# Patient Record
Sex: Male | Born: 1945 | Race: White | Hispanic: No | Smoking: Former smoker
Health system: Southern US, Community
[De-identification: ages and names within clinical notes are randomized; demographics above are authoritative.]

## PROBLEM LIST (undated history)

## (undated) DIAGNOSIS — C4491 Basal cell carcinoma of skin, unspecified: Secondary | ICD-10-CM

## (undated) DIAGNOSIS — I739 Peripheral vascular disease, unspecified: Secondary | ICD-10-CM

## (undated) DIAGNOSIS — I10 Essential (primary) hypertension: Secondary | ICD-10-CM

## (undated) DIAGNOSIS — R011 Cardiac murmur, unspecified: Secondary | ICD-10-CM

## (undated) DIAGNOSIS — R0602 Shortness of breath: Secondary | ICD-10-CM

## (undated) DIAGNOSIS — I38 Endocarditis, valve unspecified: Secondary | ICD-10-CM

## (undated) DIAGNOSIS — I4821 Permanent atrial fibrillation: Secondary | ICD-10-CM

## (undated) DIAGNOSIS — E785 Hyperlipidemia, unspecified: Secondary | ICD-10-CM

## (undated) DIAGNOSIS — Z9289 Personal history of other medical treatment: Secondary | ICD-10-CM

## (undated) DIAGNOSIS — Z77090 Contact with and (suspected) exposure to asbestos: Secondary | ICD-10-CM

## (undated) DIAGNOSIS — G4730 Sleep apnea, unspecified: Secondary | ICD-10-CM

## (undated) DIAGNOSIS — I2510 Atherosclerotic heart disease of native coronary artery without angina pectoris: Secondary | ICD-10-CM

## (undated) DIAGNOSIS — I509 Heart failure, unspecified: Secondary | ICD-10-CM

## (undated) DIAGNOSIS — J189 Pneumonia, unspecified organism: Secondary | ICD-10-CM

## (undated) HISTORY — DX: Heart failure, unspecified: I50.9

## (undated) HISTORY — PX: CATARACT EXTRACTION W/ INTRAOCULAR LENS  IMPLANT, BILATERAL: SHX1307

## (undated) HISTORY — PX: VASECTOMY: SHX75

## (undated) HISTORY — PX: FASCIOTOMY: SHX132

## (undated) HISTORY — PX: CAROTID ENDARTERECTOMY: SUR193

## (undated) HISTORY — PX: EYE SURGERY: SHX253

---

## 1998-03-29 HISTORY — PX: LUNG SURGERY: SHX703

## 1998-06-28 ENCOUNTER — Inpatient Hospital Stay (HOSPITAL_COMMUNITY): Admission: EM | Admit: 1998-06-28 | Discharge: 1998-06-29 | Payer: Self-pay | Admitting: Emergency Medicine

## 1998-12-05 ENCOUNTER — Ambulatory Visit (HOSPITAL_COMMUNITY): Admission: AD | Admit: 1998-12-05 | Discharge: 1998-12-05 | Payer: Self-pay | Admitting: *Deleted

## 1999-02-27 HISTORY — PX: CARDIAC CATHETERIZATION: SHX172

## 1999-02-27 HISTORY — PX: MITRAL VALVE REPAIR: SHX2039

## 1999-02-27 HISTORY — PX: CORONARY ARTERY BYPASS GRAFT: SHX141

## 1999-03-05 ENCOUNTER — Encounter: Payer: Self-pay | Admitting: Thoracic Surgery (Cardiothoracic Vascular Surgery)

## 1999-03-05 ENCOUNTER — Encounter: Payer: Self-pay | Admitting: Specialist

## 1999-03-05 ENCOUNTER — Encounter (INDEPENDENT_AMBULATORY_CARE_PROVIDER_SITE_OTHER): Payer: Self-pay | Admitting: Specialist

## 1999-03-06 ENCOUNTER — Inpatient Hospital Stay (HOSPITAL_COMMUNITY): Admission: AD | Admit: 1999-03-06 | Discharge: 1999-03-19 | Payer: Self-pay | Admitting: *Deleted

## 1999-03-10 ENCOUNTER — Encounter: Payer: Self-pay | Admitting: Thoracic Surgery (Cardiothoracic Vascular Surgery)

## 1999-03-11 ENCOUNTER — Encounter: Payer: Self-pay | Admitting: Thoracic Surgery (Cardiothoracic Vascular Surgery)

## 1999-03-12 ENCOUNTER — Encounter: Payer: Self-pay | Admitting: Thoracic Surgery (Cardiothoracic Vascular Surgery)

## 1999-03-13 ENCOUNTER — Encounter: Payer: Self-pay | Admitting: Thoracic Surgery (Cardiothoracic Vascular Surgery)

## 1999-03-16 ENCOUNTER — Encounter: Payer: Self-pay | Admitting: Thoracic Surgery (Cardiothoracic Vascular Surgery)

## 1999-03-18 ENCOUNTER — Encounter: Payer: Self-pay | Admitting: Thoracic Surgery (Cardiothoracic Vascular Surgery)

## 1999-05-26 ENCOUNTER — Ambulatory Visit (HOSPITAL_COMMUNITY): Admission: RE | Admit: 1999-05-26 | Discharge: 1999-05-26 | Payer: Self-pay | Admitting: Cardiology

## 1999-12-09 ENCOUNTER — Encounter: Payer: Self-pay | Admitting: Vascular Surgery

## 1999-12-11 ENCOUNTER — Inpatient Hospital Stay: Admission: RE | Admit: 1999-12-11 | Discharge: 1999-12-12 | Payer: Self-pay | Admitting: Vascular Surgery

## 1999-12-11 ENCOUNTER — Encounter (INDEPENDENT_AMBULATORY_CARE_PROVIDER_SITE_OTHER): Payer: Self-pay | Admitting: Specialist

## 1999-12-14 ENCOUNTER — Inpatient Hospital Stay (HOSPITAL_COMMUNITY): Admission: EM | Admit: 1999-12-14 | Discharge: 1999-12-20 | Payer: Self-pay | Admitting: Vascular Surgery

## 2000-01-24 ENCOUNTER — Encounter (INDEPENDENT_AMBULATORY_CARE_PROVIDER_SITE_OTHER): Payer: Self-pay | Admitting: *Deleted

## 2000-01-24 ENCOUNTER — Encounter: Payer: Self-pay | Admitting: Vascular Surgery

## 2000-01-24 ENCOUNTER — Inpatient Hospital Stay (HOSPITAL_COMMUNITY): Admission: RE | Admit: 2000-01-24 | Discharge: 2000-01-28 | Payer: Self-pay | Admitting: Vascular Surgery

## 2004-01-29 ENCOUNTER — Ambulatory Visit: Payer: Self-pay | Admitting: *Deleted

## 2004-02-26 ENCOUNTER — Ambulatory Visit: Payer: Self-pay | Admitting: Cardiology

## 2004-04-02 ENCOUNTER — Ambulatory Visit: Payer: Self-pay | Admitting: Cardiovascular Disease

## 2004-04-30 ENCOUNTER — Ambulatory Visit: Payer: Self-pay | Admitting: Cardiology

## 2004-05-08 ENCOUNTER — Ambulatory Visit: Payer: Self-pay | Admitting: *Deleted

## 2004-05-14 ENCOUNTER — Ambulatory Visit: Payer: Self-pay | Admitting: *Deleted

## 2004-05-28 ENCOUNTER — Ambulatory Visit: Payer: Self-pay | Admitting: Cardiology

## 2004-06-18 ENCOUNTER — Ambulatory Visit: Payer: Self-pay | Admitting: Cardiology

## 2004-07-20 ENCOUNTER — Ambulatory Visit: Payer: Self-pay | Admitting: Cardiology

## 2004-08-17 ENCOUNTER — Ambulatory Visit: Payer: Self-pay | Admitting: Cardiology

## 2004-09-14 ENCOUNTER — Ambulatory Visit: Payer: Self-pay | Admitting: Cardiology

## 2004-10-14 ENCOUNTER — Ambulatory Visit: Payer: Self-pay | Admitting: Cardiovascular Disease

## 2004-11-11 ENCOUNTER — Ambulatory Visit: Payer: Self-pay | Admitting: Cardiology

## 2004-12-09 ENCOUNTER — Ambulatory Visit: Payer: Self-pay | Admitting: Cardiology

## 2005-01-07 ENCOUNTER — Ambulatory Visit: Payer: Self-pay | Admitting: Cardiology

## 2005-01-20 ENCOUNTER — Ambulatory Visit: Payer: Self-pay | Admitting: Cardiology

## 2005-02-10 ENCOUNTER — Ambulatory Visit: Payer: Self-pay | Admitting: Cardiology

## 2005-02-24 ENCOUNTER — Ambulatory Visit: Payer: Self-pay | Admitting: Cardiology

## 2005-03-19 ENCOUNTER — Ambulatory Visit: Payer: Self-pay | Admitting: Internal Medicine

## 2005-04-08 ENCOUNTER — Ambulatory Visit: Payer: Self-pay | Admitting: Cardiology

## 2005-04-15 ENCOUNTER — Ambulatory Visit: Payer: Self-pay

## 2005-04-15 ENCOUNTER — Encounter: Payer: Self-pay | Admitting: Cardiology

## 2005-04-16 ENCOUNTER — Ambulatory Visit: Payer: Self-pay | Admitting: Cardiology

## 2005-05-14 ENCOUNTER — Ambulatory Visit: Payer: Self-pay | Admitting: Cardiovascular Disease

## 2005-05-26 ENCOUNTER — Ambulatory Visit: Payer: Self-pay | Admitting: Internal Medicine

## 2005-06-02 ENCOUNTER — Ambulatory Visit: Payer: Self-pay | Admitting: Internal Medicine

## 2005-06-10 ENCOUNTER — Ambulatory Visit: Payer: Self-pay | Admitting: Gastroenterology

## 2005-06-11 ENCOUNTER — Ambulatory Visit: Payer: Self-pay | Admitting: Internal Medicine

## 2005-06-18 ENCOUNTER — Ambulatory Visit: Payer: Self-pay | Admitting: Internal Medicine

## 2005-06-23 ENCOUNTER — Ambulatory Visit: Payer: Self-pay | Admitting: Internal Medicine

## 2005-07-09 ENCOUNTER — Ambulatory Visit: Payer: Self-pay | Admitting: Cardiology

## 2005-08-06 ENCOUNTER — Ambulatory Visit: Payer: Self-pay | Admitting: Internal Medicine

## 2005-08-27 ENCOUNTER — Ambulatory Visit: Payer: Self-pay | Admitting: Cardiology

## 2005-09-24 ENCOUNTER — Ambulatory Visit: Payer: Self-pay | Admitting: Cardiology

## 2005-10-13 ENCOUNTER — Ambulatory Visit: Payer: Self-pay | Admitting: Internal Medicine

## 2005-10-15 ENCOUNTER — Ambulatory Visit: Payer: Self-pay | Admitting: Cardiology

## 2005-10-29 ENCOUNTER — Ambulatory Visit: Payer: Self-pay | Admitting: Internal Medicine

## 2005-11-15 ENCOUNTER — Ambulatory Visit: Payer: Self-pay | Admitting: Internal Medicine

## 2005-11-19 ENCOUNTER — Ambulatory Visit: Payer: Self-pay | Admitting: Cardiovascular Disease

## 2005-11-26 ENCOUNTER — Ambulatory Visit: Payer: Self-pay | Admitting: Cardiology

## 2005-12-17 ENCOUNTER — Ambulatory Visit: Payer: Self-pay | Admitting: Cardiovascular Disease

## 2006-01-14 ENCOUNTER — Ambulatory Visit: Payer: Self-pay | Admitting: Cardiology

## 2006-02-04 ENCOUNTER — Ambulatory Visit: Payer: Self-pay | Admitting: Cardiology

## 2006-02-25 ENCOUNTER — Ambulatory Visit: Payer: Self-pay | Admitting: Cardiology

## 2006-03-16 ENCOUNTER — Ambulatory Visit: Payer: Self-pay | Admitting: Cardiology

## 2006-04-07 ENCOUNTER — Ambulatory Visit: Payer: Self-pay | Admitting: Cardiology

## 2006-04-13 ENCOUNTER — Ambulatory Visit: Payer: Self-pay | Admitting: Cardiology

## 2006-05-11 ENCOUNTER — Ambulatory Visit: Payer: Self-pay | Admitting: Cardiology

## 2006-06-08 ENCOUNTER — Ambulatory Visit: Payer: Self-pay | Admitting: Cardiology

## 2006-07-06 ENCOUNTER — Ambulatory Visit: Payer: Self-pay | Admitting: Cardiology

## 2006-07-13 ENCOUNTER — Ambulatory Visit: Payer: Self-pay | Admitting: Internal Medicine

## 2006-08-03 ENCOUNTER — Ambulatory Visit: Payer: Self-pay | Admitting: *Deleted

## 2006-08-19 ENCOUNTER — Ambulatory Visit: Payer: Self-pay | Admitting: Cardiovascular Disease

## 2006-09-16 ENCOUNTER — Ambulatory Visit: Payer: Self-pay | Admitting: Cardiology

## 2006-10-04 ENCOUNTER — Ambulatory Visit: Payer: Self-pay | Admitting: Cardiology

## 2006-10-25 ENCOUNTER — Ambulatory Visit: Payer: Self-pay | Admitting: Cardiology

## 2006-11-22 ENCOUNTER — Ambulatory Visit: Payer: Self-pay | Admitting: Cardiology

## 2006-11-25 DIAGNOSIS — I2510 Atherosclerotic heart disease of native coronary artery without angina pectoris: Secondary | ICD-10-CM | POA: Insufficient documentation

## 2006-11-25 DIAGNOSIS — E785 Hyperlipidemia, unspecified: Secondary | ICD-10-CM | POA: Insufficient documentation

## 2006-11-25 DIAGNOSIS — I10 Essential (primary) hypertension: Secondary | ICD-10-CM | POA: Insufficient documentation

## 2006-11-25 DIAGNOSIS — I4891 Unspecified atrial fibrillation: Secondary | ICD-10-CM

## 2006-12-20 ENCOUNTER — Ambulatory Visit: Payer: Self-pay | Admitting: Cardiology

## 2007-01-17 ENCOUNTER — Ambulatory Visit: Payer: Self-pay | Admitting: Cardiology

## 2007-02-14 ENCOUNTER — Ambulatory Visit: Payer: Self-pay | Admitting: Cardiology

## 2007-03-14 ENCOUNTER — Ambulatory Visit: Payer: Self-pay | Admitting: Cardiology

## 2007-03-30 HISTORY — PX: FEMUR FRACTURE SURGERY: SHX633

## 2007-04-05 ENCOUNTER — Ambulatory Visit: Payer: Self-pay | Admitting: Cardiology

## 2007-04-11 ENCOUNTER — Ambulatory Visit: Payer: Self-pay

## 2007-04-11 ENCOUNTER — Encounter: Payer: Self-pay | Admitting: Cardiology

## 2007-04-11 ENCOUNTER — Ambulatory Visit: Payer: Self-pay | Admitting: Cardiology

## 2007-04-11 LAB — CONVERTED CEMR LAB
ALT: 22 units/L (ref 0–53)
AST: 27 units/L (ref 0–37)
Albumin: 4 g/dL (ref 3.5–5.2)
Alkaline Phosphatase: 88 units/L (ref 39–117)
Bilirubin, Direct: 0.2 mg/dL (ref 0.0–0.3)
Cholesterol: 136 mg/dL (ref 0–200)
HDL: 39.4 mg/dL (ref >39.0)
LDL Cholesterol: 89 mg/dL (ref 0–99)
Total Bilirubin: 1.6 mg/dL — ABNORMAL HIGH (ref 0.3–1.2)
Total CHOL/HDL Ratio: 3.5
Total Protein: 7.7 g/dL (ref 6.0–8.3)
Triglycerides: 37 mg/dL (ref 0–149)
VLDL: 7 mg/dL (ref 0–40)

## 2007-05-09 ENCOUNTER — Ambulatory Visit: Payer: Self-pay | Admitting: Internal Medicine

## 2007-06-06 ENCOUNTER — Ambulatory Visit: Payer: Self-pay | Admitting: Cardiovascular Disease

## 2007-07-13 ENCOUNTER — Ambulatory Visit: Payer: Self-pay | Admitting: Cardiology

## 2007-08-10 ENCOUNTER — Ambulatory Visit: Payer: Self-pay | Admitting: Cardiology

## 2007-09-07 ENCOUNTER — Ambulatory Visit: Payer: Self-pay | Admitting: Cardiology

## 2007-10-05 ENCOUNTER — Ambulatory Visit: Payer: Self-pay | Admitting: Cardiology

## 2007-11-02 ENCOUNTER — Ambulatory Visit: Payer: Self-pay | Admitting: Cardiology

## 2007-12-28 ENCOUNTER — Ambulatory Visit: Payer: Self-pay | Admitting: Internal Medicine

## 2008-01-25 ENCOUNTER — Ambulatory Visit: Payer: Self-pay | Admitting: Internal Medicine

## 2008-02-15 ENCOUNTER — Ambulatory Visit: Payer: Self-pay | Admitting: Cardiology

## 2008-03-14 ENCOUNTER — Ambulatory Visit: Payer: Self-pay | Admitting: Cardiology

## 2008-04-11 ENCOUNTER — Ambulatory Visit: Payer: Self-pay | Admitting: Cardiology

## 2008-08-02 ENCOUNTER — Telehealth: Payer: Self-pay | Admitting: Cardiovascular Disease

## 2008-08-27 ENCOUNTER — Encounter: Payer: Self-pay | Admitting: *Deleted

## 2008-10-02 ENCOUNTER — Encounter: Payer: Self-pay | Admitting: *Deleted

## 2010-08-11 NOTE — Assessment & Plan Note (Signed)
[facility]                            CARDIOLOGY OFFICE NOTE   Connor Ford, Connor Ford                     MRN:          [id number]  DATE:04/05/2007                            DOB:          [birth year]    PRIMARY CARE PHYSICIAN:  Dr. Birdie Ford.   REASON FOR VISIT:  Cardiac follow-up.   HISTORY OF PRESENT ILLNESS:  Connor Ford comes in for a 1 year visit.  [place] and has been  doing quite a bit of work.  He has built a deck and also plans to build  a garage and barn.  He is very active and not having any angina or  limiting breathlessness.  He had no significant palpitations or bleeding  problems on Coumadin.  He does state that he was hospitalized briefly  with some type of problem with his left lung, possibly an effusion or  pneumothorax requiring a chest tube, although has not had any  significant sequelae from this.  His electrocardiogram today shows  atrial fibrillation with a stable left bundle branch block pattern.  His  medicines are outlined below.  I note that he did not tolerate our trial  of Coreg and in lieu of Cartia XT.  He states that this made him short  of breath and feels tight in his chest on exertion.  The symptoms  resolved completely when he switched back to [facility].  He is due for  follow-up lipids and an echocardiogram.   ALLERGIES:  NO KNOWN DRUG ALLERGIES.  THE PATIENT DID NOT TOLERATE  COREG.   MEDICATIONS:  1. [facility].  2. Lanoxin 0.125 mg p.o. daily.  3. Enteric-coated aspirin 81 mg p.o. daily.  4. Altace 5 mg b.i.d.  5. Lipitor 20 mg p.o. every night.  6. Cartia XT 180 mg p.o. daily.  7. Sublingual nitroglycerin 0.4 mg p.r.n.   REVIEW OF SYSTEMS:  As described in the history of present illness.   EXAMINATION:  Blood pressure 140/58, heart rate is 68 and irregular,  weight is 214 pounds down from 225 at his last visit.  A tall,  well-  developed male no acute distress.  HEENT:  Conjunctiva, lids normal.  Oropharynx clear.  NECK:  Is supple.  No elevated jugular venous pressure.  No carotid  bruits.  No thyromegaly.  LUNGS:  Are clear without labored breathing.  CARDIAC EXAM:  Reveals an irregularly irregular rhythm with 2/6 systolic  murmur at the base.  No loud diastolic murmur.  No S3 gallop.  Second  heart sounds preserved.  ABDOMEN:  Soft, nontender, normal active bowel sounds, no bruits.  EXTREMITIES:  Showed no pitting edema except around the left ankle (the  patient states he sprained his ankle recently).  Distal pulses 1+.  SKIN:  Warm and dry.  MUSCULOSKELETAL:  No kyphosis is noted.  NEUROPSYCHIATRIC:  The patient is alert and oriented x3.  Affect is  normal.   IMPRESSION AND RECOMMENDATIONS:  1. Permanent atrial fibrillation with left bundle branch block  pattern, asymptomatic.  Plan will be to continue Coumadin and      Cartia XT/digoxin at present doses.  2. Coronary artery disease with previous coronary bypass grafting in      2000 and an ejection fraction of 45% with concurrent mitral valve      repair and resection of a subaortic membrane.  Will plan a follow-      up echocardiogram.  He had evidence of mild aortic and mitral      stenosis by his prior study one year ago.     Jonelle Sidle, MD  Electronically Signed    SGM/MedQ  DD: 04/05/2007  DT: 04/05/2007  Job #: [id number]   cc:   Connor Mole. Swords, MD

## 2010-08-14 NOTE — Cardiovascular Report (Signed)
[place]  Patient:    Connor Ford                      MRN: [id number] Proc. Date: 03/05/99 Adm. Date:  [id number] Attending:  Daisey Must CC:         Valetta Mole. Swords, M.D. [facility]             Daisey Must, M.D. [facility]             Cardiac Catheterization Laboratory                        Cardiac Catheterization  PROCEDURE:  Right and left heart catheterization with coronary angiography, left ventriculography, aortic root angiography, and abdominal aortography.  INDICATIONS:  Connor Ford is a 65 year old male with a history of atrial fibrillation and mild aortic stenosis, mild mitral regurgitation.  He recently as had symptoms of progressive fatigue and dyspnea and new onset angina.  A Cardiolite scan revealed significant ischemia in the anterior wall as well as fixed inferior defect.  DESCRIPTION OF PROCEDURE:  A 6 French sheath was placed in the right femoral artery, 7 French sheath in the right femoral vein.  A standard Swan-Ganz catheter was used for the right heart catheterization.  For the left heart catheterization we utilized a 6 [place] JL5, 6 [place] JR4 and a 5 [place] angled pigtail. Contrast was Omnipaque.  There were no complications.  RESULTS:  HEMODYNAMICS:  Mean right atrial pressure 9, right ventricular pressure 40/70.  Pulmonary artery 40/29.  Pulmonary capillary wedge pressure had a V wave of 22,  mean 20.  Left ventricular pressure 122/20, aortic pressure 119/86.  There is a  very mild gradient across the aortic valve which appears to be less than 5 mmHg. Cardiac output by the thermodilution method is 4.3 with a cardiac index of 1.9.  Cardiac output by the Fick method is 4.4 with a cardiac index of 2.0.  The arterial venous oxygen content difference is 6.8.  LEFT VENTRICULOGRAM:  Left ventriculography reveals akinesis of the apex, hypokinesis of the anterior wall.  Ejection fraction is calculated at  45%. There is 3+ moderate to severe mitral regurgitation.  AORTIC ROOT ANGIOGRAPHY:  Aortic root angiography reveals a mildly dilated aortic root with 2+ mild aortic insufficiency.  Left subclavian artery is patent with a patent internal mammary artery.  ABDOMINAL AORTOGRAM:  Abdominal aortogram revealed a patent abdominal aorta renal arteries and iliac arteries.  CORONARY ARTERIOGRAPHY:  Right dominant system.  Left main:  Left main has an ostial 50% stenosis.  Left anterior descending:  The LAD has a tubular 40% stenosis in the proximal vessel.  In the mid vessel just after a large second diagonal branch is a 95% stenosis.  The distal LAD has a 25% stenosis.  The second diagonal is a large vessel and has a 50% stenosis at its origin.  Left circumflex:  The left circumflex is a relatively small vessel which has a 5% stenosis at its ostium.  There are three small obtuse marginal branches.  Right coronary artery:  The right coronary artery is a large dominant vessel giving rise to a large posterior descending artery, a small posterolateral branch and  large branching second posterolateral branch.  The mid right coronary has a 40%  stenosis followed by an area of ulcerated plaque followed by 60% stenosis.  The  distal vessel has a 30% stenosis at the acute  margin followed by 30% stenosis at the bifurcation of the posterior descending artery.  The posterior descending artery is a large vessel with a 30% stenosis at its origin.  IMPRESSION: 1. Elevated left heart filling pressures with mild pulmonary hypertension. 2. Mildly decreased left ventricular systolic function with 3+ moderate to    severe mitral regurgitation. 3. Very minimal aortic stenosis and mild aortic insufficiency. 4. Three-vessel coronary artery disease.  PLAN:  The patient will be admitted and started on anticoagulation.  We will obtain a transesophageal echocardiogram to further evaluate the anatomy of  the mitral valve.  Cardiovascular Surgery will be consulted for evaluation for coronary artery bypass grafting and mitral valve repair or replacement. DD:  03/05/99 TD:  03/07/99 Job: [id number]

## 2010-08-14 NOTE — H&P (Signed)
[facility] ADMISSION   NAME:Khun, OLUWADAMILARE TOBLER                     MRN:          [id number]  DATE:11/15/2005                            DOB:          22-Apr-1945    PROGRESS NOTE:  Mr. Wigfall walks in today with complaint of back pain. He  has a large infected cyst on his back. These are recurrent issues for him.  He does not have fevers or chills. He has noted some streaking erythema from  the cyst, around to the anterior chest wall. He complains of 5 out of 10  pain. No associated symptoms. No causative factors. No modifying factors.   PAST MEDICAL HISTORY:  As outlined on medication list.   MEDICATIONS:  Include Lanoxin, Cartia, Warfarin (he has held Warfarin  today.)   REVIEW OF SYSTEMS:  He denies nay other complaints.   PHYSICAL EXAMINATION:  VITAL SIGNS:  Temperature 98, pulse 70, respiratory  rate 14.  GENERAL:  He appears as a well developed, well nourished male in no acute  distress.  HEENT:  Normocephalic and atraumatic. Extraocular muscles intact.  NECK:  Supple. Without lymphadenopathy or thyromegaly.  CHEST:  Clear to auscultation.  CARDIAC:  S1 and S2 are normal.  SKIN:  He has a large erythematous, fluctuant cyst on his left flank area.  There is significant erythema streaking from that area, around to the  anterior chest wall.   ASSESSMENT/PLAN:  Subcutaneous cyst, likely infected. I am concerned given  the induration that this may now be an intra-muscular cyst. Also, the  streaking erythema have concern. I will give the patient Rocephin 1 gram IM  in the office and make a surgical appointment for him today.                                   Bruce Rexene Edison Swords, MD   [facility]/MedQ  DD:  11/15/2005  DT:  11/15/2005  Job #:  [id number]

## 2010-08-14 NOTE — Op Note (Signed)
[place]  Patient:    Connor Ford                      MRN: [id number] Proc. Date: 03/10/99 Adm. Date:  [id number] Attending:  Tressie Stalker CC:         Daisey Must, M.D. [facility]             Peter C. Eden Emms, M.D. [facility]             Bruce H. Swords, M.D. [facility]             CVTS office                           Operative Report  PREOPERATIVE DIAGNOSIS:  Severe three-vessel coronary artery disease with class 2 new onset angina, idiopathic hypertrophic subaortic stenosis, mitral regurgitation.  POSTOPERATIVE DIAGNOSIS:  Severe three-vessel coronary artery disease with class 2 new onset angina, idiopathic hypertrophic subaortic stenosis, mitral regurgitation.  OPERATION PERFORMED:  Median sternotomy for coronary artery bypass grafting x 3  (left internal mammary artery to distal left anterior descending coronary artery, saphenous vein graft to first diagonal branch, saphenous vein graft to distal right coronary artery) and mitral valve repair (29 mm St. Jude Taylor posterior annuloplasty) and resection of portion of interventricular septum (septal myomectomy).  SURGEON:  Salvatore Decent. Cornelius Moras, M.D.  ASSISTANT:  Loura Pardon, P.A.  ANESTHESIA:  General.  BRIEF CLINICAL NOTE:  The patient is a 65 year old white male followed by Dr. Birdie Sons and referred by Dr. Loraine Leriche Pulsipher with history of chronic atrial fibrillation dating to April 2000 and symptoms of progressive class 2 congestive heart failure and new onset angina.  The patient has had known history of proximal septal hypertrophy (idiopathic hypertrophic subaortic stenosis) and has developed moderate mitral regurgitation.  Cardiac catheterization demonstrates severe three-vessel coronary artery disease.  A full consultation note was dictated previously and should be seen for further details of the patients presentation nd indications for surgery.  (Dictation [phone])  OPERATIVE  CONSENT:  The patient and the family were counseled at length regarding the indications and potential benefits of elective surgical intervention.  He understands the associated risks of surgery including but not limited to the risks of death, stroke, myocardial infarction, congestive heart failure, bleeding requiring blood transfusion, heart block requiring permanent pacemaker, infection, traumatic ventricular septal defect and recurrent coronary artery disease.  He understands the rationale for the surgical approach as described as well as the  possible need for aortic valve replacement.  He accepts these risks as well as ny unforeseen complications and agrees to proceed with the surgery as described.  DESCRIPTION OF PROCEDURE:  The patient was brought to the operating room on the  above-mentioned date and invasive hemodynamic monitoring was established by the  anesthesia service under the care and direction of Dr. Renaye Rakers.  The patient was placed into supine position on the operating table.  Following induction with general endotracheal anesthesia, the patients chest, abdomen, both groins, both  lower extremities are prepared and draped in a sterile manner.  Transesophageal ecg was performed by Dr. Noreene Larsson.  This confirms the findings as described previously at the time of transesophageal echocardiogram originally performed by Dr. Olga Millers.  There remains evidence of left ventricular hypertrophy with at least  moderate mitral regurgitation, somewhat dilated mitral annulus and left atrium,  moderate to severe proximal septal hypertrophy with subsequent subaortic  stenosis, and mild aortic insufficiency.  Consultation was also obtained intraoperatively  with Dr. Charlton Haws who came and performed intraoperative transesophageal echocardiogram as well.  Discussion was entertained regarding the severity of the patients aortic insufficiency and whether concommitant  aortic valve replacement  was indicated.  It was felt that the degree of insufficiency was mild and that therefore unless other abnormalities were identified at the time of surgery, it  would be best to avoid aortic valve replacement at this time if possible.  A median sternotomy incision was performed and the left internal mammary artery was dissected from the chest wall and prepared for bypass grafting.  The left internal mammary artery was relatively small caliber but has adequate forward flow and was felt to be acceptable conduit for bypass grafting.  Simultaneously, saphenous vein grafting.  Simultaneously saphenous vein was obtained from the patients right lower extremity through a series of longitudinal incisions.  This saphenous vein is of fair to good quality as conduit although it is also slightly smaller than usual diameter.  The patient was heparinized systemically.  The pericardium was opened.  The ascending aorta was inspected and was notably ree of palpable plaques or calcifications.  The ascending aorta was cannulated for cardiopulmonary bypass.  A retrograde cardioplegia catheter was placed through he right atrium into the coronary sinus.  Dual venous cannulation was obtained placing a right angle metal tip cannula directly into the superior vena cava and a second cannula placed low in the right atrium down into the inferior vena cava. Adequate heparinization was verified.  Cardiopulmonary bypass was begun and the surface of the heart was inspected. The left ventricle was moderately hypertrophied and somewhat dilated.  There was some marbling within the wall of the left ventricle consistent with mild cardiomyopathy of likely ischemic origin, particularly along the lateral wall of the left ventricle.  Distal sites are selected for coronary artery bypass grafting.  Of note, there are no branches off of the distal left circumflex coronary artery which are  felt to be acceptable targets for bypass grafting.  There is a large diagonal branch off the proximal left anterior descending coronary artery which actually   supplies the majority of the lateral wall of the left ventricle.  Portions of the saphenous vein and left internal mammary artery were trimmed to the appropriate  length.  A temperature probe was placed in the left ventricular septum and a styrofoam pad was placed to protect the left phrenic nerve from thermal injury. A cardioplegia catheter was placed in the ascending aorta.  The patient was cooled to 28 degrees systemic temperature.  Umbilical tapes were placed around both the superior and inferior vena cava.  The aortic crossclamp as applied and cardioplegia was delivered initially antegrade through the aortic root. Subsequent doses of cardioplegia were administered both through the aortic root, down subsequently placed vein grafts, and retrograde through the coronary sinus  catheter intermittently during portions of the crossclamp part of the operation to keep septal temperature below 12 degrees centegrade.  The following distal coronary anastomoses were performed.  After initially a small incision was made into the  left atrium through the interatrial septum to allow complete decompression of the left side of the heart.  The umbilical tapes were snared down providing total cardiopulmonary bypass.  The following distal coronary anastomoses were performed: (1) The distal right coronary artery was grafted with saphenous vein graft in an end-to-side fashion using running 7-0 Prolene suture.  This coronary measured 2  mm in diameter and has 70 to 80% proximal stenosis.  This coronary is of good quality at the site of distal bypass.  (2) The first diagonal branch of the left anterior descending coronary artery was grafted with the saphenous vein graft in end-to-side fashion using running 7-0 Prolene suture.  This  coronary measured 2 mm in diameter and has a 90% proximal stenosis.  This coronary was of good quality at the site of the distal bypass.  (3) The distal left anterior descending coronary artery was  grafted with the left internal mammary artery using running 8-0 Prolene suture.  This coronary measured 2 mm in diameter and had a 95% proximal stenosis.  This coronary was of fair to good quality at the site of distal bypass.  It was somewhat hard and had palpable plaque throughout its wall throughout the entire course down the anterior wall of the left ventricle.  However, a 1.5 mm probe will pass all the way to the apex of the heart.  Attention is now addressed to the mitral valve.  The atriotomy was extended through the intra-atrial septum and adequate exposure of the mitral valve was facilitated using a self-retaining retractor.  The mitral valve was carefully inspected. The left ventricular cavity was irrigated with iced saline solution and allowed to fill, thereby to carefully demonstrate the anatomical irregularities of the mitral valve.  The anterior leaflet of the mitral valve is somewhat thickened throughout. However, it has only minimal restriction and relatively good mobility.  There is no prolapse of any portion of the anterior leaflet.  The posterior leaflet is mildly redundant particularly towards the posteromedial commissure.  However, there is no prolapse in this region and complicated leaflet repair is felt not to be necessary as with appropriate plication of the posterior annulus, it appears that more normal coaptation of the valve should ensue.  Subsequently, the mitral orifice was sized to a 29 mm Taylor posterior annuloplasty ring.  Posterior annuloplasty was performed using interrupted horizontal mattress 2-0 Ethibond sutures to perform  plication circumferentially around the posterior annulus.  A St. Jude Taylor annuloplasty ring was subsequently  secured in place.  After completion of the repair, the integrity of the mitral valve was again checked by irrigating the left ventricular cavity with iced saline solution.  There appears to be no residual eak and there appears to be normal coaptation of the leaflets.  The self retaining retractor was removed.  An oblique transverse aortotomy incision was performed.  The aortic valve was carefully inspected.  The aortic valve was tricuspid and appeared fairly normal  with only minimal thickening of the edges of the valve leaflet.  All three commissures appeared to coapt normally and the leaflets were delicate and appeared to move normally.  The leaflet apposed to the left main coronary artery was mildly redundant in comparison with the other two remaining leaflets.  However, otherwise this valve appears normal.  Vein retractor was utilized to carefully retract the right coronary leaflet anteriorly, thereby exposing the interventricular septum.  One can easily appreciate very hypertrophied interventricular septum protruding into the LV outflow tract.  In fact, one cannot see past the septum into the left ventricular cavity hardly at all.  The endocardial surface was fibrotic and thickened in this region as is the corresponding fibrotic thickened appearance of the apposed surface of the anterior leaflet of the mitral valve.  A small ribbon retractor was placed through the aortic annulus to retract the anterior leaflet of the  mitral valve inferiorly while the vein retractor was utilized to retract the right coronary usp of the aortic valve anteriorly.  A #15 blade knife fixed to a slightly bent knife blade handle was utilized to perform the septal myomectomy.  The first incision was made longitudinally into the muscle beginning at a location approximately 2 to mm towards the right of the middle of the distance between the commissures between the left and right cusps and the  right and noncoronary cusps of the aortic valve. his incision was completed deep through the hypertrophied muscle going away from the surgeon towards the apex of the heart over a distance of approximately 3 cm.  A  second incision was placed approximately 1 cm to the surgeons left immediately parallel to the first incision which was approximately 3 to 4 mm to the right of the commissure of the left and right cusps of the aortic valve.  Finally, a horizontal incision was used to complete the resection joining the two previously  placed incisions beginning approximately 3 mm below the level of the aortic valve annulus.  By doing so, a trench was created through the hypertrophied septum approximately 1 cm in width and approximately 1 cm deep.  The muscle specimen was removed and briefly inspected and subsequently sent for routine pathologic evaluation.  The remaining trench in the interventricular septum was carefully inspected.  There was no sign of passageway into the right ventricle.  There was no sign of conduction tissue which could be appreciated within the surgical incisions. The left ventricular outflow tract appeared much more open and one could now see freely down into the left ventricular chamber.  The aortotomy was closed using two-layer running everted 4-0 Prolene suture.  Just prior to completion of the closure of the aortotomy, the left atriotomy was closed using two-layer running  everted 3-0 Prolene suture.  One final dose of warm retrograde cardioplegia was  administered.  Subsequently, the left internal mammary artery was opened and the septal temperature was noted to rise rapidly and dramatically.  The patient was  placed in steep Trendelenburg position and lungs were inflated vigorously while  volume was left in the heart.  Additional maneuvers were performed to agitate and removed air from the left atrium, left ventricle, and aortic root.  After completion  of all the deairing maneuvers, the aortic crossclamp was flashed briefly.  Subsequently, the transverse aortotomy closure was completed.  The heart was emptied and the aortic crossclamp was removed after a total crossclamp time of 143 minutes.  The patient was rewarmed to greater than 37 systemic temperature.  Both proximal saphenous vein anastomoses were performed to the ascending aorta under a separate partial occlusion clamp.  The retrograde cardioplegia catheter was removed as were the umbilical tapes previously placed around both vena cavae. ll proximal and distal coronary anastomoses were inspected for hemostasis and appropriate orientation of the grafts.  A ____________ needle was placed in the  ascending aorta to continue the deairing of any remaining bubbles within the left heart circulation.  Epicardial pacing wires were fixed to the right ventricular  outflow tract and to the right atrial appendage.  The patient resumed spontaneous sinus rhythm but heart block persisted requiring dual chamber AV sequential pacing. The patient was weaned from cardiopulmonary bypass without difficulty.  The patients rhythm at separation from bypass was sinus rhythm with complete heart block in slow junctional escape requiring dual chamber AV sequential pacing. The patient was weaned from bypass on low  dose dopamine and milrinone infusions. Total cardiopulmonary bypass time for the operation was 218 minutes.  Follow-up transesophageal echocardiogram performed after separation from bypass was performed initially by Dr. Noreene Larsson and subsequenly by Dr. Eden Emms.  This demonstrates resolution of any significant residual air in the left ventricle, left atrium and LV outflow tract and aorta.  There was no residual mitral regurgitation and the  annuloplasty ring appeared to be seated appropriately.  The subaortic stenosis as greatly reduced and a much broader LV outflow tract could easily  be appreciated. The aortic valve remained unchanged from previously with only mild aortic insufficiency consisting of a small narrow central jet of 1+ aortic insufficiency. Left ventricular function appeared well preserved. No other abnormalities were identified.  Specifically, there was absolutely no sign of ventricular septal defect.  The venous and arterial cannulae were removed uneventfully.  Protamine was administered to reverse the anticoagulation.  The mediastinum and the left chest were irrigated with warm saline containing vancomycin.  Meticulous surgical hemostasis was ascertained.  There remained severe coagulopathy.  An exhaustive  search for any sites of mechanical bleeding was undertaken.  After more than 60  minutes of continued and exhaustive search for sites of mechanical bleeding, one 8-pack of adult platelets and two units fresh frozen plasma were administered for continued coagulopathy.  The mediastinum and the left chest were drained with three chest tubes placed through separate stab incisions inferiorly.  The median sternotomy was closed in routine fashion.  The right lower extremity incisions were closed in multiple layers in routine fashion.  All skin incisions were closed with subcuticular skin closures.  The patient tolerated the procedure well and he was transported to the surgical  intensive care unit in stable condition.  There were no intraoperative complications.  All sponge, needle and instrument counts were verified correct t the completion of the operation. DD:  03/10/99 TD:  03/11/99 Job: [id number]

## 2010-08-14 NOTE — Procedures (Signed)
[place]. [facility]  Patient:    Connor Ford                      MRN: [id number] Proc. Date: 03/05/99 Adm. Date:  [id number] Attending:  Daisey Must                           Procedure Report  PROCEDURE:  Transesophageal echocardiogram.  CARDIOLOGIST:  Madolyn Frieze. Jens Som, M.D.  INDICATIONS:  This is a transesophageal echocardiogram to access the patients mitral regurgitation prior to coronary artery bypass graft.  DESCRIPTION OF PROCEDURE:  The patient was sedated with Demerol 25 mg and Versed 5 mg IV x 1.  The pharynx was anesthetized with topical Hurricaine spray.  The Omniplane probe was passed without difficulty.  There was distal septal and apical hypokinesis with overall mild reduction in left ventricular function.  The estimated ejection fraction was approximately 45%. There was mild atherosclerosis noted in the descending aorta.  The mitral valve was thickened.  There was mildly reduced cusp excursion of the  posterior leaflet.  There was moderate mitral regurgitation (2+/4).  There was lso a mild aortic sclerosis with mild to moderate aortic insufficiency (this was 2+/4 as well).  There was trace tricuspid regurgitation.  There was no intra-atrial low communication by color.  FINAL INTERPRETATION: 1. Distal septal and apical hypokinesis with overall mild reduction in left    ventricular function. 2. Mild atherosclerosis of the descending aorta. 3. Mildly thickened mitral valve with mild reduction of the excursion of the    posterior leaflet. 4. Moderate mitral regurgitation. 5. Mild to moderate aortic insufficiency. 6. Trace tricuspid regurgitation. DD:  03/05/99 TD:  03/08/99 Job: [id number]

## 2010-08-14 NOTE — Discharge Summary (Signed)
[facility]  Patient:    Connor Ford, Connor Ford                     MRN: [id number] Adm. Date:  [id number] Disc. Date: [id number] Attending:  Alyson Ford Dictator:   Connor Ford, P.A. CC:         Connor Ford, M.D. [facility]  Connor Ford, M.D. 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  SECONDARY DIAGNOSES: 1. History of atherosclerotic coronary artery disease.  He is status post    mitral valve replacement with a St. Jude mechanical valve and a coronary    artery bypass graft surgery done in December 2000 by Dr. Evelene Croon. 2. Chronic atrial fibrillation on chronic Coumadin therapy. 3. 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 4. Hypercholesterolemia. 5. Hypertension. 6. Status post vasectomy.  PROCEDURES: September 14, 200, right carotid endarterectomy with Dacron patch angioplasty, intraoperative shunt, Dr. Tawanna Cooler Early.  The patient tolerated the procedure well.  He awoke without mental confusion, agitation, no neurologic deficits.  DISCHARGE DISPOSITION:   [name] is judged a suitable candidate for discharge on postoperative day #1.  He has been afebrile in the postoperative period.  He required modest oxygen supplementation on the day of surgery, but this was not necessary on the following day, postoperative day #1.  His postoperative labs were within normal limits.  His neurologic status in the recovery room and during his hospital course has been at baseline.  He has had no neurologic deficits.  He moves upper and lower extremities appropriately, swallows without difficulty.  His speech is clear.  His incision is healing nicely without evidence of erythema or drainage.  He is ambulating independently.  His pain is controlled with oral  analgesics.  DISCHARGE MEDICATIONS: 1. Percocet 5/325 one to two tablets p.o. q.4-6h. p.r.n. pain. 2. Coumadin 5 mg daily.  He will take 7.5 mg on postoperative day #1 and    then continue with 5 mg daily. 3. Lopressor 50 mg b.i.d. 4. Lopressor 10 mg at bedtime daily. 5. Cartia XT 180 mg daily.  DISCHARGE DIET:  Low-sodium, low-cholesterol diet.  ACTIVITY:  Ambulation as tolerated.  He is asked not to drive for the next two weeks.  WOUND CARE:  He may shower daily beginning Sunday, September 16.  He is asked to keep his incisions clean and dry.  FOLLOWUP:  He has an office visit with Dr. Arbie Cookey two weeks after his discharge.  Dr. Nicholes Mango office will call with that appointment.  BRIEF HISTORY:  Connor Ford is a 65 year old male with known history of extracranial cerebrovascular occlusive disease.  He was last seen in the office of [facility] on September 10, 1999.  He had recovered well, although his heart rhythm is still atrial fibrillation. The patient has rate controlled and is stable hemodynamically.  On the June 14 visit, carotid Doppler ultrasound showed evidence of critical right internal carotid artery stenosis and severe left internal carotid artery stenosis.  Dr. Arbie Cookey recommended staging of carotid endarterectomies with the right to be performed first.  The patient understands the risks and benefits of the surgery and elects to proceed with a right carotid endarterectomy on September 14.  HOSPITAL COURSE:  As as described in Discharge Disposition.  The patient was discharged on postoperative day #  1.  His mental status postoperatively and following day were at baseline.  He exhibited no focal nor regional neurologic deficits.  His swallow was unimpaired.  His speech was clear.  Mental status was unconfused and alert, oriented x 3.   Discharge medications and followup as described above. DD:  12/11/99 TD:  12/14/99 Job:  [id number]

## 2010-08-14 NOTE — Consult Note (Signed)
Connor Ford. [facility]  Patient:    Connor Ford                      MRN: [id number] Proc. Date: 03/06/99 Adm. Date:  [id number] Attending:  Daisey Ford CC:         Connor Ford, M.D. [facility]             Connor Ford, M.D.             Connor Ford (private dentist)                          Consultation Report  DATE OF BIRTH:  07-Mar-1946  [name] is a 65 year old white male referred by Connor Ford and Connor Ford for dental consultation.  The patient is scheduled for open heart  surgery on Tuesday, March 10, 1999.  The patient is now seen as part of a pre-heart valve surgery dental protocol to rule out dental infection.  PAST MEDICAL HISTORY: 1. 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 2. Aortic insufficiency. 3. Atrial fibrillation, onset April 2000.    a. Placed on long-term Coumadin therapy plus long-acting Diltiazem therapy.    b. Failed cardioversion treatment September 2000. 4. Coronary artery disease status post cardiac catheterization on March 05, 1999.    a. Three-vessel coronary artery disease.    b. Ejection fraction of approximately 45%.    c. Moderate to severe mitral regurgitation.    e. Mild to moderate aortic insufficiency. 5. History of asbestos exposure with abnormal chest x-ray findings. 6. Status post vasectomy.  ALLERGIES/ADVERSE DRUG REACTIONS:  None known.  MEDICATIONS: 1. Lopressor 25 mg twice daily. 2. Digoxin 0.125 mg daily. 3. Amiodarone 400 mg daily. 4. Enteric-coated aspirin 325 mg daily.  SOCIAL HISTORY:  The patient is married and lives in [place], [place]. The patient is previously from [place].  The patient has smoked one to two packs per day x 30+ years.  The patient has a history of heavy alcohol use, currently drinks one to two beers per day for the past 10+ years.  FUNCTIONAL ASSESSMENT:  The patient was independent for ADLs prior  to this admission.  FAMILY HISTORY:  Mother died at age 4 with a stroke.  Father is deceased with black lung disease.  REVIEW OF SYSTEMS:  (Reviewed from History and Physical/Chart this admission.)  DENTAL HISTORY:  CHIEF COMPLAINT:  The patient needs dental consultation prior to anticipated aortic valve replacement/mitral valve replacement/coronary artery bypass graft surgery  with Dr. Cornelius Ford.  HISTORY OF PRESENT ILLNESS:  A patient with aortic insufficiency, moderate to severe mitral regurgitation, and three-vessel coronary artery disease and is scheduled for open heart surgery on Tuesday, March 10, 1999, with Dr. Tressie Ford.  The patient is now seen as part of a pre-heart valve surgery dental protocol to rule out dental infection prior to the surgery.  The patient currently denies acute dental problems, toothaches, swellings, or abscesses.  The patient was last seen by his private dentist (Connor Ford) approximately February 1999.  The patient had a dental cleaning/exam at that time. The patient does require SBE antibiotic prophylaxis as per AHA guidelines as needed prior to invasive dental procedures.  DENTAL EXAMINATION:  GENERAL:  The patient is a tall, well-developed, well-nourished, white male in o acute distress.  HEAD AND NECK:  There is no lymphadenopathy which is palpable.  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  The patient does deny acute TMJ symptoms at this time.  INTRAORAL EXAMINATION:  There is no apparent soft tissue pathology which is noted. The patient has bilateral mandibular lingual tori.  PERIODONTAL:  Patient with chronic periodontal disease with evidence of plaque nd calculus accumulations/accretions, gingival recession, and bone loss.  DENTITION:  The patient has missing tooth numbers 1 through 2, 4, 12 through 13, 15 through 16, 17, 19, 21, 30, and 32.  There are multiple flexure lesions  which are noted.  CARIES/SUBOPTIMAL RESTORATIONS:  There are no obvious gross dental caries which are noted.  I would suggest a full series of periapical radiographs and a clinical xam to rule out defective restorations/caries by the private dentist of his choice n the future.  ENDODONTIC:  There is no history of acute pulpitis symptoms.  The patient has a  previous root canal on tooth numbers 3, 10, and 29 with no apparent persistent symptoms or periapical pathology.  CROWN AND BRIDGE:  There are multiple crown and bridge restorations which are noted.  I would suggest a full series of radiographs and clinical exam to further evaluate need for further crown and bridge restorations.  PROSTHODONTIC:  There is no history of removable prostheses.  OCCLUSION:  There is a poor occlusal scheme secondary to multiple missing teeth, supraeruption, and drifting of the unopposed teeth into the edentulous areas and lack of replacement of some of the missing teeth with restorations.   The occlusion is stable at this time, however.  RADIOGRAPHIC INTERPRETATION:  (Suboptimal panoramic x-ray taken on March 05, 1999.)  Multiple missing teeth numbers 1 through 2, 4, 12 through 13, 15 through 17, 19, 21, 30, and 32.  There is moderate horizontal and vertical bone loss which is noted.  There are multiple crown and bridge, amalgam, and resin restorations which are noted.  There is a previous root canal therapy on tooth numbers 2, 10, and 9 with no apparent persistent periapical pathology.  There is evidence of supraeruption and drifting of the unopposed teeth into the edentulous areas.  ASSESSMENT:  1. Plaque and calculus accumulations/accretions.  2. Chronic periodontal disease with moderate bone loss.  3. Bilateral mandibular lingular tori.  4. Multiple missing teeth.  5. Multiple flexure lesions.  6. Lack of replacement of some missing teeth with dental restorations.   7.  Supraeruption and drifting of the unopposed teeth into the edentulous areas.  8. Poor occlusal scheme secondary to numbers 4, 6, and 7 as above.  9. History of previous root canal therapy with no apparent persistent periapical     pathology. 10. No history of acute pulpitis, toothache, swellings, or abscesses. 11. Need for subacute bacterial endocarditis antibiotic prophylaxis prior to     invasive dental procedures.  PLAN/RECOMMENDATIONS:  1. Discussed risks, benefits, and complications of various treatment options     in light of the anticipated aortic valve replacement/mitral valve replacement/     coronary artery bypass graft open heart surgery.  Discussed having a dental  cleaning with antibiotic prophylaxis prior to the surgery if Dr. Cornelius Ford allows     this.  The patient to follow up with private dentist (Connor Ford)     for future dental needs.  2. Discussion of need for proper oral hygiene maintenance and the risk for SBE/     valvular infection today.  3. Initiation of chlorhexidine rinse 0.12%, rinsing with 15 ml twice daily.  Patient to spit out excess; patient should not swallow the rinse.  4. Discussion of findings with Dr. Cornelius Ford concerning the ability/stability     of the patient to undergo dental cleaning on Monday, March 09, 1999,     antibiotic of choice for the SBE prophylaxis, and ability to proced with     heart surgery on Tuesday, March 10, 1999. DD:  03/08/99 TD:  03/08/99 Job: [id number]

## 2010-08-14 NOTE — Discharge Summary (Signed)
[facility]  Patient:    Connor Ford, Connor Ford                     MRN: [id number] Adm. Date:  [id number] Disc. Date: [id number] Attending:  Alyson Locket Dictator:   Loura Pardon, P.A. CC:         Katy Fitch. Sypher, Montez Hageman., M.D.  Daisey Must, M.D. [facility]  Bruce H. Swords, M.D. [facility]   Discharge Summary  DATE OF BIRTH:  Jul 26, 1945  ATTENDING SURGEON:  Dr. Tawanna Cooler Early.  ORTHOPEDIC SURGEON:  Dr. Josephine Igo.  CARDIOLOGIST:  Dr. Loraine Leriche Pulsipher.  PRIMARY CAREGIVER:  Dr. Birdie Sons.  FINAL DIAGNOSIS:  Acute compartment syndrome left forearm, status post arterial line placement/laceration of left radial artery and subsequent administration of Coumadin/Lovenox after right carotid endarterectomy which was done December 11, 1999.  SECONDARY DIAGNOSES: 1. Bilateral extracranial cerebrovascular occlusive disease, status post right    carotid endarterectomy on December 11, 1999. 2. History of atherosclerotic coronary artery disease, status post coronary    artery bypass graft surgery and mitral valve replacement December 2000. 3. Chronic atrial fibrillation, on chronic Coumadin therapy. 4. Hypercholesterolemia. 5. 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  PROCEDURES:  December 14, 1999, urgent left volar forearm fasciotomy involving superficial/deep compartments, release of epimysium of pronator quadratus, left carpel tunnel release with clot evacuation, repair of 4 mm laceration/pseudoaneurysm of left radial artery.  DISPOSITION:  Mr. Mathhew Buysse is judged a suitable candidate for discharge on postoperative day #6.  At the time of discharge his Coumadin was therapeutic, and heparin drip was discontinued.  He had been maintained on IV heparin ever since postoperative day #1, and his Coumadin had been started on postoperative day #2.  His postoperative wound was healing nicely.  The dressing was changed on postoperative day #4, and he will see Dr.  Josephine Igo for another dressing change on December 21, 1999, postoperative day #6.  He has motor control and sensitivity intact in the left hand, good capillary refill.  His pain is nonexistent.  He rates it at its worst at a 1 or 2/10.  He is not taking any oral analgesia.  He has remained afebrile postoperatively.  He is ambulating independently.  His mental status has remained clear.   He goes home on the following medications.  MEDICATIONS: 1. Lanoxin 0.125 daily. 2. Coumadin 5 mg daily, except for Monday and Friday when he takes 2.5 mg. 3. Cartia XL 180 mg daily. 4. Lopressor 50 mg twice daily. 5. Lipitor 10 mg at bedtime.  DIET:  Low sodium, low cholesterol diet.  ACTIVITY:  He may ambulate as tolerated.  He is asked to keep his left arm elevated when at rest, and to wear a sling when ambulating.  WOUND CARE:  He may sponge bathe until Dr. Josephine Igo in consultation suggests that he can.  FOLLOW-UP:  He has a follow-up appointment with Dr. Teressa Senter Monday, December 21, 1999, at 3:15 in the afternoon.  He is asked to contact the Coumadin clinic to check his PT/INR level Tuesday afternoon, December 22, 1999, and he has an office visit with Dr. Arbie Cookey December 31, 1999, at 3:40 p.m.  HISTORY OF PRESENT ILLNESS:  Mr. Pascucci is a 65 year old male who underwent right carotid endarterectomy on December 11, 1999.  He noted increased swelling in the left forearm on the evening of December 13, 1999, and called the office of [facility] on December 14, 1999.  He had already discontinued taking Lovenox.  He was evaluated at the office, with a diagnosis of early compartment syndrome.  He was admitted to [facility], and a consult with Dr. Teressa Senter was obtained. Dr. Teressa Senter recommended urgent evacuation of hematoma and fasciotomy to the left volar forearm.  HOSPITAL COURSE:  After admission to [facility]  on December 14, 1999, complaining of swelling and pain and numbness in the left hand, he was seen by Dr. Josephine Igo, who took him urgently to the operating room the evening of December 14, 1999.  A left volar forearm fasciotomy as well as repair of a 4 mm laceration of the left radial artery was performed. Hematoma was evacuated.  Left carpal tunnel was released, and the epimysium of the pronator quadratus was also released.  The patient tolerated the procedure well, and postoperative course has taken five days.  He will go home on postoperative day #6.  After leaving the OR, he had drains in place in the left upper extremity.  He was started on heparin drip on postoperative day #1. He was not complaining of significant pain.  Motor function and sensitivity were intact to the left hand.  The left upper extremity was elevated at all times.  He had good capillary refill in the fingers of his left hand.  On postoperative day #2, the swelling that had been noted in the right neck as a concomitant problem was decreased, and he was started on oral Coumadin.  He had received four units of fresh frozen plasma prior to surgery for an INR of 1.7, and it has been a little difficult to achieve an elevated INR postoperatively secondary to anticoagulation factors still present in the bloodstream.  On postoperative day #3, he was continued on his Coumadin.  His drains in the left forearm were removed.  On postoperative day #4, PT was 16.4, INR was 1.6.  After two doses of Coumadin, hematocrit was 35.1%, hemoglobin 11.6.  Dressings to the left forearm were changed.  Adaptic was placed on the wound.  Silvadene, 4 x 4s, Kerlix, Webril, and ace wrap, as well as a plaster splint.  On postoperative day #4, he had increased heart rate, atrial fibrillation and rapid ventricular response despite taking  Cardizem 180 mg daily and despite Lopressor.  He was started back on his preoperative digoxin.  Lopressor  was increased to 50 mg twice daily.  He was started on a Cardizem drip.  He maintained atrial fibrillation, but rates were decreased to the 100 to 110-beat-per-minute range.  He was asymptomatic with this, with good blood pressure.  It is hoped that he can go home on the preoperative medications controlling his atrial fibrillation which include Cartia XL 180 mg daily, Lopressor 150 mg b.i.d., and digoxin 0.125 mg daily. Mr. Stonehocker has had a smooth recovery from left volar forearm fasciotomy. DD:  12/19/99 TD:  12/21/99 Job: [id number]

## 2010-08-14 NOTE — H&P (Signed)
. [facility]  Patient:    Connor Ford, Connor Ford                     MRN: [id number] Adm. Date:  [id number] Attending:  Alyson Locket Ford:   Connor Ford, P.A.                         History and Physical  DATE OF BIRTH:  [birth year].  CHIEF COMPLAINT:  Left ICA stenosis.  HISTORY OF PRESENT ILLNESS:  Connor Ford is a pleasant 65 year old white male with known history of carotid artery disease, status post right CEA on December 11, 1999, complicated by a right neck hematoma as well as left forearm hematoma as a result of left radial arterial line which caused compartment syndrome.  This required left forearm fasciotomy.  He has now recovered; however, the patient still has high-grade left carotid artery stenosis.  Dr. Kristen Ford. Early evaluated the patient on December 31, 1999 and recommended to proceed with surgery.  He will be admitted on January 24, 2000 for heparinization, followed by surgery on January 26, 2000 and then conversion to Coumadin again.  On the visit prior to surgery, he denies any weakness, paresthesias, facial droop, disequilibrium to standing or walking. No falling, impaired speech, headache, nausea, vomiting or vertigo.  No dizziness, blurred vision, amaurosis fugax, seizure, dysphagia, confusion or memory loss.  PAST MEDICAL HISTORY 1. Carotid artery disease. 2. PVOD. 3. CAD, status post CABG. 4. Ejection fraction 45%. 5. Hypertension. 6. Hypercholesterolemia.  PAST SURGICAL HISTORY 1. Status post right CEA, Dr. Arbie Ford, December 11, 1999. 2. Status post CABG -- MVR, Dr. Alleen Ford. 3. Status post cardiac catheterization, Dr. Loraine Leriche Ford, December 2000. 4. Status post direct-current cardioversion (failed attempt) in December 2000. 5. Status post remote vasectomy. 6. Status post left forearm fasciotomy on December 11, 1999.  MEDICATIONS 1. Coumadin 5 mg p.o. q.d.; the patient stopped currently until  further    direction. 2. Cartia XL 180 mg p.o. q.d. 3. Lopressor 50 mg p.o. b.i.d. 4. Lipitor 10 mg p.o. q.h.s. 5. Lanoxin 0.125 mg p.o. q.d.  ALLERGIES:  No known drug allergies.  REVIEW OF SYSTEMS:  See HPI and past medical history for significant positives otherwise, essentially negative.  FAMILY HISTORY:  Mother died of stroke and one brother who died of black lung disease, as well as two uncles who died of strokes.  The rest of the family history is noncontributory.  SOCIAL HISTORY:  Married.  Four children.  He is an [employer] representative.  He quit in December 2000 the use of two packs a day of cigarettes for 30 years.  He drinks two beers a day.  PHYSICAL EXAMINATION  GENERAL:  Well-developed, well-nourished, 65 year old white male in no acute distress, alert and oriented x 3.  VITAL SIGNS:  Blood pressure 128/80, pulse 74, respirations 18.  HEENT:  Normocephalic, atraumatic.  PERRLA.  EOMI.  Right cataract formation. The rest of the funduscopic exam is within normal limits.  NECK:  Supple.  No JVD.  There is a trace of bruit on the right and a soft bruit on the left.  No thyromegaly or lymphadenopathy.  His right carotid endarterectomy site looks well-healed.  CHEST:  Symmetrical on inspiration.  LUNGS:  Clear to auscultation bilaterally, although there are decreased breath sounds at the bases.  CARDIOVASCULAR:  Regular rate and rhythm with 1/6 systolic murmur.  No rubs or gallops.  ABDOMEN:  Soft, nontender.  Bowel sounds x 4.  No hepatosplenomegaly.  There is the presence of abdominal and iliac bruits but no masses palpable.  GU:  Deferred.  RECTAL:  Deferred.  EXTREMITIES:  No clubbing, cyanosis, or edema.  The left forearm has an incision which is well-healed with no obvious infection.  There are positive ulnar and radial pulses.  SKIN:  Skin shows no ulcerations and warm temperature.  PERIPHERAL PULSES:  Carotids 2+ bilaterally with the  presence of a trace of bruit on the right and a soft bruit on the left.  Femorals are 2+ bilaterally with a faint bruit bilaterally.  Popliteal, dorsalis pedis and posterior tibialis 2+ bilaterally.  NEUROLOGIC:  Grossly normal.  Normal gait.  DTRs 2+ bilaterally.  Muscle strength 5/5.  ASSESSMENT AND PLAN:  [age]-year-old white male with a history of coronary artery disease who will undergo left carotid endarterectomy on January 26, 2000 at [facility], although he will be admitted on January 24, 2000 for heparinization per pharmacy.  Dr. Arbie Ford has seen and evaluated this patient prior to the admission and has explained the risks and benefits involved in the procedure and the patient has agreed to continue. DD: 01/26/00 TD:  01/26/00 Job: [id number]

## 2010-08-14 NOTE — Op Note (Signed)
[place]  Patient:    Connor Ford                      MRN: [id number] Proc. Date: 03/10/99 Adm. Date:  [id number] Attending:  Tressie Stalker CC:         CVTS office                           Operative Report  PREOPERATIVE DIAGNOSIS:  Bleeding, status post coronary artery bypass grafting,  mitral valve repair, septal myomectomy.  POSTOPERATIVE DIAGNOSIS:  Bleeding, status post coronary artery bypass grafting, mitral valve repair, septal myomectomy.  PROCEDURE:  Re-exploration for bleeding.  SURGEON:  Salvatore Decent. Cornelius Moras, M.D.  ASSISTANT:  Areta Haber, P.A.  ANESTHESIA:  General.  BRIEF CLINICAL NOTE:  The patient is a 65 year old male who underwent coronary artery bypass grafting, mitral valve repair, and septal myomectomy earlier in the day on December 12.  The patients initial postoperative course was notable for severe coagulopathy.  He remained hemodynamically stable and was brought to the  surgical intensive care unit.  There, he continued to have significant chest tube out despite gradual correction in his coagulopathy with blood products as necessary.  The patient is now brought back to the operating room for re-exploration due to persistent excessive mediastinal chest tube drainage.  DESCRIPTION OF PROCEDURE:  The patient is brought to the operating room on the above mentioned date, and placed in the supine position on the operating table.  The patients existing chest tubes are removed and the anterior chest is prepared and draped in a sterile manner.  The median sternotomy incision is reopened. Thorough exploration of the mediastinum is performed.  There is a moderate amount of blood within the anterior mediastinum and the left pleural space.  This was evacuated.  A single obvious arterial bleeder is identified early after re-exploration, located on the under side of the xiphoid process on the surgeons side of the  table, just under the bone edge.  This is easily corrected with cauterization.  After this, the mediastinum and the left chest are irrigated with copious warm saline solution containing vancomycin.  An exhaustive search for additional sites of mechanical bleeding is performed.  No other significant sites of bleeding are identified.  All proximal and distal anastomosis and previous bypass grafts are  carefully examined, as are the aortotomies, atriotomies from the previous procedure.  These are all intact.  No other sites of bleeding are identified, and there appears to be no significant bleeding.  Three new chest tubes are replaced through the previous incision to drain the left pleural space and the anterior mediastinum.  The pericardium was reapproximated  over the ascending aorta in the R-V outflow tract.  The median sternotomy was reclosed in the routine fashion.  The skin incisions were closed with subcuticular skin closure.  The patient tolerated the procedure well and was transported back to the surgical intensive care unit in stable condition.  There were no intraoperative complications.  All sponge, instrument and needle counts were verified correct t completion of the operation. DD:  03/10/99 TD:  03/11/99 Job: [id number]

## 2010-08-14 NOTE — Procedures (Signed)
[place]. [facility]  Patient:    Connor Ford                      MRN: [id number] Proc. Date: 03/10/99 Adm. Date:  [id number] Attending:  Tressie Stalker CC:         Connor Ford, M.D.                           Procedure Report  PROCEDURE:  Intraoperative transesophageal echocardiography.  HISTORY:  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  The patient is now scheduled for coronary artery bypass grafting, possible mitral valve repair and  evaluation of the aortic valve and interventricular septum for possible myomectomy or aortic valve replacement.  The patient was brought to the operating room at [facility] and general anesthesia was induced without difficulty.  The transesophageal echocardiography probe was inserted into the esophagus.  IMPRESSION:  PREBYPASS FINDINGS: 1. Two to 3+ mitral insufficiency.  The mitral leaflets were thickened and slightly    redundant with no clear prolapse.  There was a large central jet of mitral    regurgitation present.  There was blunted systolic forward flow in the left    upper and right upper pulmonary veins by pulse-wave Doppler interrogation.    There was no fluttering of the leaflets. 2. One to 2+ aortic insufficiency.  There was aortic insufficiency jet originating    from the point of coaptation of all three of the aortic leaflets.  Leaflets    appeared thin and pliable with no restriction to opening.  They coapted well,    but there was not complete closure at the point of coaptation of all three    leaflets.  Because there was septal hypertrophy noted in the subaortic region,    there was significant turbulence in the aortic outflow tract and it was    difficult to determine the exact width of the aortic  insufficiency jet in    relation to the width of the aortic outflow tract.  However, it was estimated    to be a 1 to 2+ aortic insufficiency. 3. Asymmetric septal hypertrophy with possible subaortic obstruction of the aortic    outflow tract.  There was a nob of septal tissue in the subaortic region, which    was 1.86 cm in diameter and it resulted in some turbulent flow in the aortic  outflow tract in systole.  There was no systolic anterior motion of the mitral    valve noted. This was a very discreet nob and the rest of the interventricular    septum did not appear excessively hypertrophied. 4. Moderate left ventricular dilation with slightly decreased contractility of he    left ventricle with an estimated ejection fraction of 45% and no focal    abnormalities of left ventricular contractility. 5. Normal-appearing right ventricle, right ventricular function and right    ventricular size. 6. Trace tricuspid insufficiency. 7. Intact interatrial septum with no evidence of atrial septal defect or patent  foramen ovale. 8. The left atrium was not enlarged and there was no evidence of thrombus in the    left atrium or left atrial appendage.  POSTBYPASS FINDINGS: 1. Status post mitral valve repair using an annuloplasty ring.  There was the  characteristic trap door appearance of the anterior leaflet and there was no    residual mitral insufficiency noted.  There was one area of turbulence in the    mitral inflow pattern using the color Doppler.  This appeared to be at the edge    of the annulus, but there did not appear to be any significant mitral stenosis    or flow convergence noted on color Doppler. 2. Status post myomectomy of the subaortic region of the interventricular    septum.  There was a scooped out appearance of the area where there was a    previously nob of septal tissue that impinged into the left ventricular outflow    tract.  This was now largely removed.   There was less turbulence in the aortic    outflow tract and no systolic anterior motion of the mitral valve noted. There    was no evidence of an interventricular septal defect noted on color Doppler    interrogation. 3. Residual 1-2+ aortic insufficiency, which appeared somewhat less severe that  initially felt in the prebypass study because there was less turbulence in the    aortic outflow tract and the jet could be properly assessed in relation to the    width of the aortic outflow tract and it appeared to be approximately 30% of    left ventricular outflow tract. 4. Decreased left ventricular size with slightly decreased global contractility of    the left ventricle, the contractility appeared essentially unchanged from the    prebypass study, but the left ventricular size was diminished. DD:  03/10/99 TD:  03/11/99 Job: [id number]

## 2010-08-14 NOTE — Op Note (Signed)
[place]. [facility]  Patient:    Connor Ford, Connor Ford                     MRN: [id number] Proc. Date: 12/14/99 Adm. Date:  [id number] Attending:  Alyson Locket CC:         Larina Earthly, M.D.  Daisey Must, M.D. [facility]  Bruce H. Swords, M.D. [facility]  Katy Fitch. Naaman Plummer., M.D.   Operative Report  PREOPERATIVE DIAGNOSIS:  Acute compartment syndrome, left forearm status post arterial line placement on December 11, 1999 with subsequent administration of Coumadin and Lovenox as perioperative anticoagulant; status post right carotid endarterectomy and a history of valve replacement and atrial fibrillation.  POSTOPERATIVE DIAGNOSIS:  Acute compartment syndrome, left forearm status post arterial line placement on December 11, 1999 with subsequent administration of Coumadin and Lovenox as perioperative anticoagulant, status post right carotid endarterectomy and a history of valve replacement and atrial fibrillation.  Identification of acute compartment syndrome left volar forearm involving the superficialis muscles, profundus muscles and clot dissecting in ____________ space in the proximal forearm between the superficialis and profundus muscles and clot dissecting into the carpal tunnel and midpalmar space.  The pathology identified was a 4 mm side wall laceration/tear of the radial artery with a loss of clot following previous arterial line placement.  OPERATION PERFORMED: 1. Left volar forearm fasciotomy involving superficial and deep compartments    including release of epimysium of pronator quadratus muscle. 2. Left carpal tunnel release with clot evacuation from ____________ space    and carpal canal as well as extensive clot evacuation from proximal    forearm. 3. Repair of 4 mm laceration/false aneurysm of radial artery with a 9-0 nylon    suture.  SURGEON:  Katy Fitch. Sypher, Montez Hageman., M.D.  ASSISTANT:  Jonni Sanger, P.A.  ANESTHESIA:   General orotracheal.  SUPERVISING ANESTHESIOLOGIST:  Dr. Almeta Monas.  ESTIMATED BLOOD LOSS:  100 cc.  FLUID REPLACEMENT:  No packed cells, but 4 units of fresh frozen plasma were given to correct a Coumadin anticoagulation evidenced by an elevated prothrombin time at 17.1 seconds and an INR of 1.7.  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  He was noted to have bilateral internal carotid artery obstruction and is status post right carotid endarterectomy by Dr. Arbie Cookey on December 11, 1999.  He was discharged on December 12, 1999 and was placed on Lovenox and resumed his Coumadin anticoagulation.  In the early postoperative period on December 13, 1999 he noted swelling of his left volar forearm and neck.  He contacted the CVTS physician on call, Dr. Madilyn Fireman and was advised to discontinue his Coumadin.  He also held his Lovenox using his own judgment.  He subsequently contacted Dr. Madilyn Fireman early today and was advised to come to the office for clinical evaluation.  Dr. Madilyn Fireman felt he might be developing early compartment syndrome due to bleeding in the left forearm and admitted him to [facility].  He was admitted late in the afternoon of December 14, 1999 and at 4:45 p.m. a hand surgery consult was called to my office.  He was evaluated at 5:20 p.m. at which time he was noted to have a  mild claw posture of his long, ring and small fingers, pain on passive stretch of the long, ring and small fingers, a tense forearm with ecchymosis noted at the distal volar aspect of the forearm and numbness in his index finger and thumb. His long finger still had normal sensibility.   His thenar muscle function was normal and his extrinsic muscle function was noted to be normal except the increased muscle tone and claw posture.  Clinical examination also suggests that he may have had some bleeding in the region of his right carotid surgery although the swelling may also be due simply to dissection.  We made the clinical diagnosis of acute compartment syndrome and made arrangements for reversal of his anticoagulation noted to be an INR of 1.7 after taking a dose of Coumadin on Friday and Saturday, September 15 and December 13, 1999.  We recommended reversing his anticoagulation with 4 units of fresh frozen plasma and contacted Dr. Almeta Monas for an anesthesia consult. Arrangements were made for urgent surgery at this time.  DESCRIPTION OF PROCEDURE:  Delmos Velaquez was brought to the operating room and placed in supine position on the operating table.  Dr. Almeta Monas, after anesthesia consult, was satisfied that his anticoagulation had been properly reversed and induced general orotracheal anesthesia.  The left arm was prepped with DuraPrep and draped with impervious arthroscopy drapes and stockinet. The arm was exsanguinated with an Esmarch bandage and arterial tourniquet inflated to 220 mmHg.  The procedure commenced with an extended volar forearm incision measuring approximately 10 cm in length with a zigzag incision across the wrist flexion creases.  The incision was extended into the palm to release the carpal tunnel.  The forearm incision was taken first with scalpel revealing a tense volar forearm fascia.  With tenotomy scissors, the forearm fascia released immediately visualizing the median nerve compressed against the volar forearm fascia.  The median nerve was gently cleared with a Therapist, nutritional and the  volar forearm fascia was released at the level of the carpal tunnel.  The palmar incision was completed and the superficial palmar arch and common sensory branch  of the median nerve were identified distally.  The transverse carpal ligament was meticulously dissected and released with scissors and scalpel dissection.  The anatomy of the carpal tunnel was quite distorted due to a tense hematoma within the carpal canal.  With proximal and distal dissection, the canal was ultimately opened with wide widespread of the edges of the transverse carpal ligament more than 1.5 cm. The clot within the canal was digitally broken up and evacuated with suction. The superficial palmar arch was normal in appearance.  A large amount of clot was evacuated from the superficial region of the distal forearm surrounding the superficialis muscles.  The median nerve appeared to be unimpeded.  Superficial fasciotomy was completed to the level just below the antecubital fossa with serrated scissors.  The ____________  space was digitally palpated and found to be filled with gelatinous clot.  With digital dissection, the clot was broken up and removed with suction with a Yankauer suction.  Digital dissection identified clot in the proximal forearm in the space between the superficialis and profundus muscles.  This was irrigated with sterile saline and removed with the suction.  The pronator quadratus was inspected and found to have an ischemic appearance.  Its fascia was released with scissors.  The muscle was noted to swell slightly and have improvement in its color immediately.  Attention was then directed to  the radial artery which was carefully dissected from a large clot.  A hard clot measuring approximately 1 cm x 8 mm was removed from the wall of the radial artery and a laceration/false aneurysm was noted at the site of previous arteriotomy for arterial line.  The lumen appeared to be preserved.  Vessel loops were placed proximal and distal and used for hemostasis.  The tourniquet was released and immediately there was noted to be brisk arterial bleeding from the  sidewall of the artery.  This was repaired with a series of interrupted sutures of 9-0 nylon.  Given the need for Mr. Chelf to be anticoagulated in the early postoperative period, I elected not to perform a resection and arterial repair of the segment of teh artery in that approximately one third of the wall was involved.  A satisfactory repair of the arteriotomy site was completed with interrupted sutures.  Thereafter, adequate flow was noted.  The turgor of the index finger and thumb was noted to be normal and capillary refill was less than two seconds.  Several venous bleeders were controlled with bipolar cautery followed by irrigation of the wound with sterile saline and triple antibiotic solution. The palmar incision was closed with intradermal 3-0 Prolene followed by corner sutures of 3-0 Prolene at the wrist flexion creases.  The forearm wound was repaired with a series of short loops of Prolene with some gaps to remain to allow swelling.  At the conclusion of the procedure the tone of the muscles of the forearm had returned to normal.  The posture of the fingers was normal and full passive relaxation was achieved without significant effort.  The wound was dressed with Adaptic, Silvadene, sterile gauze, ABD pads, sterile Webril and a volar plaster splint maintaining the wrist in 10 degrees dorsiflexion.  There were no apparent complications.  Mr. Dibello anticoagulation will be held overnight.  We anticipate that his anticoagulation may be resumed in approximately 24 hours.  He was given a fourth unit of fresh frozen plasma during the surgery and we anticipate checking a PT and PTT in approximately six hours. DD:  12/14/99 TD:  12/16/99 Job: [id number]

## 2010-08-14 NOTE — Discharge Summary (Signed)
[place]. [facility]  Patient:    Connor Ford, Connor Ford                     MRN: [id number] Adm. Date:  [id number] Disc. Date: 01/27/00 Attending:  Alyson Locket Dictator:   Loura Pardon, P.A. CC:         Daisey Must, M.D. [facility]  Katy Fitch. Naaman Plummer., M.D.  Bruce Rexene Edison Swords, M.D. [facility]   Discharge Summary  DATE OF BIRTH:  February 03, 1946  CARDIOLOGIST:  Daisey Must, M.D.  FINAL DIAGNOSES: 1. History of extracranial cerebrovascular occlusive disease. 2. 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 3. Status post right carotid endarterectomy December 11, 1999.  SECONDARY DIAGNOSES: 1. Infrainguinal arterial occlusive disease. 2. Atherosclerotic coronary artery disease status post coronary artery bypass    graft surgery/mitral valve replacement-Dr. Laneta Simmers. 3. Ejection fraction 45%. 4. Hypertension. 5. Hypercholesterolemia. 6. Cardiac dysrhythmia:  Atrial fibrillation with controlled ventricular rate    with wide ventricular complex secondary to a left bundle branch block. 7. History of tobacco habituation two packs per day x 30 years; quit December    2000.  PROCEDURE:  January 26, 2000:  Left carotid endarterectomy with Dacron patch angioplasty-Dr. Gretta Began.  Patient tolerated the procedure well and was transferred in stable and satisfactory condition to the recovery room.  BRIEF HISTORY:  Mr. Hockett is a 65 year old male with a known history of extracranial cerebrovascular occlusive disease.  He is status post right carotid endarterectomy December 11, 1999.  This surgery was complicated by right neck hematoma as well as left forearm hematoma secondary to left radial arterial line which resulted in compartment syndrome.  This required left forearm fasciotomy.  He has now recovered from the surgery.  The patient does have high grade left internal carotid artery stenosis.  Patient was seen by Dr. Tawanna Cooler Early October 4 and Dr. Nicholes Mango  recommendation was to proceed with surgery on the left internal carotid artery.  He is to be admitted October 28 to be started on IV heparin.  He will hold his daily dose of Coumadin on the visit prior to his surgery.  Mr. Potempa denies any history of cerebro emolic event.  He has no experience of unilateral weakness, paraesthesia, facial droop, disequilibrium on standing or walking.  He has had no impaired speech, headache, nausea, vomiting, or vertigo.  He does not have any history of amaurosis fugax, blurred vision, seizure, or dysphagia.  No memory loss.  HOSPITAL COURSE:  Mr. Dozal was admitted to [facility] on October 29.  He was immediately started on IV heparin per the pharmacy.  He had stopped his Coumadin on Saturday, October 27.  PT/INR on admission was PT 23.7, INR 3.0.  He was prepared for surgery scheduled for October 30.  On October 30 his INR was at an acceptable level of 1.8.  The surgery proceeded as described above.  He was judged suitable for discharge on postoperative day #1.  A JP drain had been placed intraoperatively.  It was draining a slight amount of serous fluid.  There was no swelling in the neck, no evidence of hematoma present.  Mr. Bogus was alert and oriented x 3.  He had no neurologic deficits in the immediate postoperative period.  He was ambulating independently.  He had no postoperative nausea.  He was afebrile in the postoperative period.  Hemoglobin was 12.3, hematocrit 35.5%, serum blood glucose under good control.  Postoperative INR  on postoperative day #1 was 1.6, PT 16.9.  He was seen by Dr. Loraine Leriche Pulsipher postoperatively who recommended that he start on his regular regimen of Coumadin, there being no data to support postoperative heparinization or Lovenox in a patient with chronic atrial fibrillation having no experience of prior embolic event.  DISCHARGE MEDICATIONS: 1. Coumadin 5 mg daily except for 2.5 mg on Friday, 2.5  mg on Monday. 2. Cartia XL 180 mg daily. 3. Lopressor 50 mg b.i.d. 4. Lipitor 10 mg at bedtime. 5. Lanoxin 0.125 mg daily. 6. Baby aspirin 81 mg daily.  He will see Shelby Dubin at the [facility] on Monday, November 5 for PT/INR study.  He will also see Dr. Arbie Cookey in follow-up Thursday, November 15 at 11:20 a.m.  DISCHARGE ACTIVITY:  Ambulation as tolerated.  He is asked not to drive for the next two weeks.  He may shower beginning Thursday, November 1.  He is asked to sponge bathe until then.  DISCHARGE DIET:  Low sodium, low cholesterol diet.  He has been given prescriptions for pain-Tylox one to two tabs p.o. q.4-6h. p.r.n. pain.  Once again, he will follow up with Dr. Gretta Began.  Patients condition is stable. DD:  01/27/00 TD:  01/27/00 Job: [id number]

## 2010-08-14 NOTE — Consult Note (Signed)
[facility]  Patient:    Connor Ford                      MRN: [id number] Proc. Date: 03/05/99 Adm. Date:  [id number] Attending:  Daisey Must CC:         Daisey Must, M.D. [facility]             Madolyn Frieze. Jens Som, M.D. [facility]             Bruce H. Swords, M.D. [facility]             [facility] Office                          Consultation Report  REQUESTING PHYSICIAN:  Daisey Must, M.D.  PRIMARY CARE PHYSICIAN:  Valetta Mole. Swords, M.D.  REASON FOR CONSULTATION:  Severe three-vessel coronary artery disease with new-onset exertional angina, class II congestive heart failure, mitral regurgitation, subaortic stenosis, aortic insufficiency.  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  The patient presented with new-onset atrial fibrillation on April 1, 000 and was subsequently begun on long-term anticoagulation with Coumadin and rate control with long-acting Diltiazem at that time.  The patient has subsequently een followed by Dr. Loraine Leriche Pulsipher.  Echocardiogram performed in April 2000 demonstrated normal left ventricular function with mild aortic stenosis, mild aortic insufficiency, mild-to-moderate mitral regurgitation and proximal septal  hypertrophy.  Patient continued to do reasonably well.  He underwent attempted -C cardioversion in September but this failed to keep the patient in normal sinus rhythm.  The patient subsequently returned with increasing symptoms of progressive dyspnea on exertion and decreasing energy.  He underwent a stress Cardiolite on  February 25, 1999.  This demonstrated anterior, anterolateral and anteroseptal ischemia with inferior scar.  The patient now describes new-onset symptoms of chest discomfort described as  heaviness in the left side of his chest occurring with physical exertion that has developed over the last two to three weeks; in each case, the patients symptoms have resolved with rest.  He denies any similar chest pain occurring at rest or with minimal physical exertion and his chest pain has  only been with relatively significant physical exertion.  He continues to describe symptoms of dyspnea on exertion but denies resting shortness of breath.  He denies any symptoms of PND, orthopnea, lower extremity edema, syncope or near-syncopal  episodes.  Cardiac catheterization performed today demonstrates severe three-vessel coronary artery disease with mild left ventricular dysfunction and moderate mitral regurgitation.  Specifically, there is 95% stenosis of the mid left anterior descending coronary artery immediately after a large first diagonal branch. There is 50% proximal left main coronary stenosis and 40% proximal left anterior descending coronary stenosis.  There is 50-70% proximal stenosis of the first diagonal branch.  There is a long tubular 95% proximal stenosis of the left circumflex coronary artery, although the distal left circumflex and its branches are very small.  There is 60-70% stenosis of the mid right coronary artery with 50% stenosis of the distal right coronary artery at the bifurcation.  Overall ejection fraction was estimated 45% with moderate-to-severe mitral regurgitation.  There was mild-to-moderate aortic insufficiency.  Transvalvular gradient across the aortic valve was measured approximately 10 to 11 mmHg.  Right heart catheterization  demonstrated pulmonary artery pressures 40/28 with pulmonary capillary wedge pressure 22 and thermodilution cardiac output of 4.3 L, corresponding to cardiac index of 1.9.  The patient subsequently underwent transesophageal echocardiogram performed by Dr. Madolyn Frieze. [place].  Reportedly, this demonstrates  moderate mitral regurgitation with mild aortic sclerosis, mild-to-moderate aortic insufficiency, trace tricuspid regurgitation.  Left ventricular function was mildly reduced with ejection fraction estimated 45%, including distal septal and apical hypokinesis  with proximal septal hypertrophy.  REVIEW OF SYSTEMS:  Review of systems is as stated previously.  In addition, the patient denies any recent history of dental problems including no loose or tender teeth.  He denies any fevers, chills or productive cough.  He denies any symptoms of transient unilateral neurologic deficits and he reports no symptoms of claudication.  His bowel habits have been regular and he denies any history of hematochezia, hematemesis, melena.  He has not had any problems tolerating long-term anticoagulation with Coumadin.  PAST MEDICAL HISTORY:  Notable for the absence of any preexisting history of episodes of congestive heart failure, myocardial infarction, diabetes, stroke, IA. The patients lipid status is not known.  The patients past medical history is also notable for a history of asbestos exposure in the distant past.  The patient reportedly has abnormal chest x-ray attributable to asbestos exposure and associated pleural plaques.  PAST SURGICAL HISTORY:  Notable only for a previous vasectomy in the distant past.  FAMILY HISTORY:  Notable for the absence of early-onset coronary artery disease. The patient has no family history of valvular heart disease and the patient himself denies any known history of rheumatic fever in the distant past.  SOCIAL HISTORY:  The patient is married, lives with his wife in [place] and is employed as an Holiday representative, requiring heavy strenuous physical exertion.  He has a long history of tobacco abuse, smoking in excess of one to wo packs of cigarettes per day for at least the past 30 years; the patient currently is smoking approximately one  pack of cigarettes a day.  The patient also has a ong history of alcohol use and at times in the distant past, drank fairly heavily. At  present, the patient denies any significant alcohol abuse, although he continues to drink at least one to two beers each day.  CURRENT MEDICATIONS: 1. Cardia XT 300 mg p.o. once daily. 2. Coumadin 7.5 mg alternating with 5.0 mg daily.  PHYSICAL EXAMINATION:  GENERAL:  Physical exam is notable for a well-appearing white male in no acute distress.  He is in atrial fibrillation with rate controlled in the 70s to 80s. He is normotensive and afebrile.  HEENT:  Grossly within normal limits.  NECK:  His thyroid cannot be palpated.  There is no cervical or supraclavicular  lymphadenopathy.  There is no carotid bruit appreciated.  There is no jugular venous distention.  CHEST:  Clear lung fields bilaterally.  No wheezes or rhonchi are noted.  CARDIOVASCULAR:  Irregularly irregular rhythm.  There is a grade 2/4 systolic murmur heard best over the left side of the chest, particularly the left lower sternal border and towards the apex of the heart.  No diastolic murmur can be appreciated.  ABDOMEN:  Soft and nontender.  No masses or hepatomegaly are appreciated.  EXTREMITIES:  Warm and well-perfused.  Lower extremity pulses are 2+ and symmetrical throughout.  There is no lower extremity edema.  NEUROLOGIC:  Grossly nonfocal and symmetrical throughout.  LABORATORY DATA:  Hemoglobin 15.4, hematocrit 47%, WBC 9600,  platelet count 246,000.  Electrolytes prior to cardiac catheterization include sodium 135, potassium 4.3, chloride 102, bicarb 28, BUN 12 and creatinine 0.9.  Coagulation  profile demonstrates prothrombin time 15.7 seconds with INR of 1.4 and PTT 38 seconds.  IMPRESSION:  Severe three-vessel coronary artery disease with mild left ventricular dysfunction, new-onset exertional angina and class II congestive heart failure,  complicated  by moderate mitral regurgitation and mild-to-moderate aortic insufficiency with mild subvalvular aortic stenosis.  PLAN:  We tentatively plan to proceed with surgery on Tuesday, next week.  I will review the transesophageal echocardiographic tape subsequently and discuss at length with Dr. Gerri Spore and Dr. Jens Som regarding possibility of need for mitral valve repair, possible aortic valve replacement, possible septal myomectomy. At the very least, Mr. Bunte absolutely needs to undergo coronary artery bypass grafting.  In the meantime, we will obtain a dental service consult to make sure his dentition are appropriate prior to proceeding with valvular surgery.  All of Mr. Chapmans questions have been addressed at this point in time.  DD: 03/05/99 TD:  03/06/99 Job: [id number]

## 2010-08-14 NOTE — Assessment & Plan Note (Signed)
[facility]                            CARDIOLOGY OFFICE NOTE   Alper, Guilmette EWALD BEG                     MRN:          [id number]  DATE:04/07/2006                            DOB:          10/08/1945    REASON FOR VISIT:  Cardiac followup.   HISTORY OF PRESENT ILLNESS:  I saw Mr. Reindl back in January 2007.  From a cardiovascular perspective, he has been stable without any angina  and baseline NYHA class II dyspnea on exertion.  His electrocardiogram  today shows atrial fibrillation which is chronic, and a left bundle  branch block pattern that is old.  He did not report any palpitations or  syncope.  He has been tolerating his Coumadin without bleeding problems.  I note that he had a gastrointestinal evaluation and there was  consideration for a colonoscopy off Coumadin, although, Mr. Grahn  tells me that he elected not to pursue any colonoscopy and has not had  his Coumadin interrupted.  We did talk about this today, and I think it  would be reasonable for him to temporarily interrupt Coumadin if  colorectal screening were needed, review of his medications and also his  echocardiogram report from 2007.  His last ejection fraction was 45% and  he had mild mitral stenosis with thickened valve status post repair  associated with mild mitral regurgitation.  Also noted was evidence of  mild aortic stenosis with a mean gradient of only 12 mmHg.  Today, we  talked about some medication adjustments.   ALLERGIES:  No known drug allergies.   CURRENT MEDICATIONS:  1. Coumadin as directed by [facility].  2. Lanoxin 0.125 mg p.o. daily.  3. Enteric coated aspirin 81 mg daily.  4. Altace 5 mg p.o. daily.  5. Cardia XT 180 mg p.o. daily.  6. Lipitor 20 mg p.o. q.h.s.  7. Sublingual nitroglycerin 0.4 mg p.r.n.   REVIEW OF SYSTEMS:  As described in history of present illness.  He  states that he cut his foot with a nail from a board taken off  of a  barn.  He cannot remember the last time he had a tetanus shot.  He  sustained a major injury.   PHYSICAL EXAMINATION:  VITAL SIGNS:  Weight 225 pounds, blood pressure  122/65, heart rate 60.  GENERAL:  The patient is comfortable in no acute distress.  HEENT:  Conjunctivae was normal.  Oropharynx clear.  NECK:  Supple without jugular venous distention.  No bruits.  No  thyromegaly noted.  LUNGS:  Clear without wheeze.  CARDIAC:  Regular rate and rhythm with a 2/6 systolic murmur heard at  the base.  No S3, gallops noted.  ABDOMEN:  Soft, nontender, no bruits.  EXTREMITIES:  No significant pain.  Distal pulses are 2+.  SKIN:  Warm and dry.  No changes are noted.   IMPRESSION/RECOMMENDATIONS:  1. Coronary disease status post previous coronary artery bypass graft      in 2000 with ejection fraction of 45%, chronic left bundle branch      block and concurrent mitral valve repair with  resection of a      subaortic membrane.  I will plan to continue his present regimen      except to discontinue Cardia XT in favor or Coreg 6.25 mg p.o.      b.i.d. to start.  This can be titrated for optimal blood pressure      and heart rate control.  I hope that this may lead to improvement      in his systolic function and perhaps be a better choice than long-      term counseling channel blocker.  He will be monitoring his heart      rate blood pressure and let us know how he does so that we can make      appropriate adjustments.  2. Chronic atrial fibrillation, rate controlled and with left bundle      branch block pattern.  He will continue Coumadin through the      [facility].  He is not having any palpitations.  3. Hypertension.  Well controlled today.  4. The patient will receive a tetanus shot today.     Jonelle Sidle, MD  Electronically Signed    SGM/MedQ  DD: 04/07/2006  DT: 04/07/2006  Job #: [id number]   cc:   Valetta Mole. Swords, MD

## 2010-08-14 NOTE — Op Note (Signed)
[facility]  Patient:    Connor Ford, Connor Ford                     MRN: [id number] Proc. Date: 12/11/99 Adm. Date:  [id number] Disc. Date: [id number] Attending:  Alyson Locket CC:         Valetta Mole. Swords, M.D. [facility]  Daisey Must, M.D. [facility]   Operative Report  PREOPERATIVE DIAGNOSES:  Severe bilateral carotid stenosis.  POSTOPERATIVE DIAGNOSES:  Severe bilateral carotid stenosis.  PROCEDURE:  Right carotid endarterectomy and Dacron patch angioplasty.  SURGEON:  Dr. Tawanna Cooler Early.  ASSISTANT:  Dr. Cari Caraway.  ANESTHESIA:  General endotracheal.  COMPLICATIONS:  None.  DISPOSITION:  To recovery room stable.  DESCRIPTION OF PROCEDURE:  The patient was taken to the operating room and placed in a position where the area of the right neck was prepped and draped in the usual sterile fashion. An incision was made in anterior sternocleidomastoid and carried down to the platysma with electrocautery. The sternocleidomastoid was dissected posteriorly and the carotid sheath was opened. The common carotid artery was encircled with umbilical tape and rumel tourniquet. The vagus nerve was identified and preserved. The dissection was extended onto the bifurcation. The superior thyroid artery was encircled with a 2-0 silk Potts tie. The external carotid was encircled with blue vessiloop and the internal carotid was encircled with an umbilical tape and Rumel tourniquet. The hypoglossal nerve was identified and preserved. The patient was given 7000 units of intravenous heparin and after adequate circulation time, the internal, external and common carotid arteries were occluded. The common carotid artery was opened with an 11 blade, extended longitudinally with Potts scissors through the plaque onto the internal carotid with Potts scissors. The 10 shunt was passed up the internal carotid, allowed to back bleed, and then down the common carotid where it was secured  with a Rumel tourniquet. The endarterectomy was begun on the common carotid artery and the plaque was divided proximally with the Potts scissors. Endarterectomy was extended onto the bifurcation. The external carotid was endarterectomized with the eversion technique and the internal carotid was endarterectomized in an open fashion.  The remainder of the atheromatous debris was removed from the endarterectomy plane. A Finesses Hemashield Dacron patch was brought onto the field and was sewn as a patch angioplasty with a running 6-0 Prolene suture. Prior to completion of the anastomosis, the shunt was removed and the clamps were replaced. The anastomosis was then completed in the external followed by the common and finally the internal carotid artery occlusion clamp was removed. Excellent flow characteristics were noted with hand held Doppler in the internal and external carotid arteries. The patient was given 50 mg of protamine to reverse the heparin. The wound was irrigated with saline, hemostasis obtained with electrocautery. The wounds were closed with 3-0 Vicryl in the subcutaneous and subcuticular tissue. Benzoin and Steri-Strips were applied. DD:  12/11/99 TD:  12/14/99 Job: [id number]

## 2010-08-14 NOTE — H&P (Signed)
[facility]  Patient:    Connor Ford, Connor Ford                       MRN: [id number] Adm. Date:  12/11/99 Attending:  Larina Earthly, M.D. Dictator:   Marlowe Kays, P.A. CCValetta Mole. Swords, M.D. [facility]  Daisey Must, M.D. [facility]   History and Physical  DATE OF BIRTH:  06/10/1945  CHIEF COMPLAINT:  Bilateral carotid artery stenosis.  REFERRING PHYSICIAN:  Dr. Cato Mulligan and Dr. Gerri Spore.  HISTORY OF PRESENT ILLNESS:  This is a 65 year old white male with known history of carotid artery disease.  Last seen in the office on September 10, 1999, after the patient had undergone CABG - MVR by Dr. Laneta Simmers in December 2000.  He has recovered from surgery since then, and although he is still in atrial fibrillation, the patient is rate controlled and stable hemodynamically.  At the time, Dopplers showed right ICA peaked systolic velocity at 543 cm per second, with an end-diastolic of 224 cm per second.  The left ICA had 34 cm per second peaked systolic velocity, with an end-diastolic of 136, thus demonstrating bilateral ICA stenosis, more pronounced on the right than on the left.  Dr. Arbie Cookey recommended stage CEA to reduce the risk of stroke which is scheduled on December 11, 1999.  On todays visit, the patient states that during the last three months, no episodes of weakness, paresthesia, facial droop, difficulty standing or walking, or falling had been experienced by him.  No impaired speech.  No headache.  No ________ or vertigo.  No dizziness.  He complains of occasional peripheral field of vision "blurriness," which resolves within 30 minutes of appearance.  No amaurosis fugax.  No seizure.  No dysphagia, confusion, or memory loss.  PAST MEDICAL HISTORY: 1. CAD, status post CABG. 2. Bilateral CIA stenosis. 3. CAS, on chronic Coumadin. 4. 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 5. Ejection fraction of 45%. 6. Hypercholesterolemia. 7. Hypertension.  PAST  SURGICAL HISTORY: 1. CABG x 3 with LIMA to LAD, SVG to the diagonal #1, SVG to the distal right    coronary artery, and mitral valve repair with a 29 mm [employer] tailor    posterior annuloplasty, and resection of the portion of interventricular    septum (septal myomectomy), by Dr. Cornelius Moras on March 10, 1999. 2. Status post cardiac catheterization, December 2000, Dr. Gerri Spore. 3. Status post DCCV, failed attempt on December 2000. 4. Status post remote vasectomy. 5. Multiple tooth extractions.  MEDICATIONS: 1. Coumadin 5 mg p.o. q.d., stopped on December 06, 1999. 2. Cartia XT 180 mg p.o. q.d. 3. Lovenox injection 80 mg b.i.d., discontinued on December 09, 1999. 4. Lopressor 50 mg b.i.d. 5. Lipitor 10 mg p.o. q.d.  ALLERGIES:  NKDA.  REVIEW OF SYSTEMS:  See HPI and past medical history of significant positives. Otherwise, no diabetes, kidney disease, asthma, COPD, TIA, CVA, syncope, presyncope, and amaurosis fugax.  No angina.  He has occasional palpitations from heavy exertion.  No significant shortness of breath.  No MI, PE, or DVT. No GI bleed.  No dysuria or hematuria.  No GERD symptoms.  No symptoms of CHF.  FAMILY HISTORY:  Mother died of stroke at 23.  Two uncles who died of stroke in their 80s and 76s, and his brother died of black lung disease.  SOCIAL HISTORY:  Married, four children.  He works as an Holiday representative.  He quit in December 2000, the use of one or two-packs-a-day of cigarettes for 30 years.  He used to drink heavily in the past, and now he drinks one or two beers a day.  No heavy liquor ingestion.  PHYSICAL EXAMINATION:  GENERAL:  A well-developed and well-nourished 66 year old white male in no acute distress.  Alert and oriented x 3.  VITAL SIGNS:  Blood pressure 105/16 on the left, 110/60 on the right.  Pulse 68, respirations 18.  HEENT:  Normocephalic and atraumatic.  PERRLA and EOMI.  There is right mild cataract formation.  NECK:   Supple with no JVD.  There are bilateral bruits heard, more pronounced on the left than on the right.  No thyromegaly or lymphadenopathy.  CHEST:  Symmetrical inspirations.  LUNGS:  Clear to auscultation bilaterally with decreased breath sounds at the bases.  CARDIOVASCULAR:  Irregularly irregular rate and rhythm, 1/6 short systolic murmur.  No rubs or gallops.  There is a well-healed sternotomy site.  ABDOMEN:  Soft and nontender.  Bowel sounds x 4.  No hepatosplenomegaly or masses palpable.  There is the presence of a prominent bruit, loudest around the umbilicus.  GU AND RECTAL:  Deferred.  EXTREMITIES:  No clubbing or cyanosis.  There is a trace of edema in the left lower extremity.  Skin shows no ulcerations.  Normal hair pattern.  Warm temperature.  Peripheral pulses and carotids 2+ bilaterally with bilateral bruits.  Femorals 2+ bilaterally.  Popliteal, dorsalis pedis, and posterior tibialis 2+ bilaterally.  NEUROLOGIC:  Grossly normal.  Normal gait.  DTRs 1+ on the right and 2+ on the left.  Muscle strength 5/5.  ASSESSMENT AND PLAN:  A 65 year old white male with known history of carotid artery disease who will undergo right CEA on December 11, 1999, at [facility] by Dr. Arbie Cookey.  Risks and benefits have been explained to the patient, and the patient has agreed to continue. DD:  12/09/99 TD:  12/09/99 Job: [id number]

## 2010-08-14 NOTE — Op Note (Signed)
[place]  Patient:    Connor Ford, Connor Ford                     MRN: [id number] Proc. Date: 01/26/00 Adm. Date:  [id number] Attending:  Alyson Locket                           Operative Report  PREOPERATIVE DIAGNOSIS:  Asymptomatic left internal carotid artery stenosis.  POSTOPERATIVE DIAGNOSIS:  Asymptomatic left internal carotid artery stenosis.  OPERATION PERFORMED:  Left carotid endarterectomy and Dacron patch angioplasty.  SURGEON:  Larina Earthly, M.D.  ASSISTANT:  Sherrie George, P.A.  ANESTHESIA:  General endotracheal.  COMPLICATIONS:  None.  DISPOSITION:  To recovery stable.  DESCRIPTION OF PROCEDURE:  The patient was taken to the operating room and placed in supine position where the area of the left neck prepped and draped in the usual sterile fashion.  Incision was made anterior sternocleidomastoid and carried down to the platysma with electrocautery.  Sternocleidomastoid was reflected posteriorly and the carotid sheath was opened.  The facial vein was ligated with 2-0 silk ties and divided.  The common carotid artery was encircled with an umbilical tape and Rumel tourniquet.  Then vagus and hypoglossal nerves were identified and preserved.  The external carotid artery was encircled with a blue vessel loop.  The internal carotid was encircled with an umbilical tape and Rumel tourniquet and the superior thyroid artery was encircled with a 2-0 silk Potts tie.  The patient was given 7000 units of intravenous heparin.  After adequate circulation time, the internal, external and common carotid arteries were occluded.  The common carotid artery was opened with an 11 blade and extended longitudinally with Potts scissors.  A 12 shunt was passed up the internal carotid, allowed to backbleed and then down the common carotid where it was secured with Rumel tourniquets.  The endarterectomy was begun on the common carotid artery and  the plaque was divided proximally with Potts scissors.  The endarterectomy was ____________ bifurcation.  The external carotid artery was endarterectomized with eversion technique and the internal carotid was endarterectomized in an open fashion. The remaining atheromatous debris was removed from the endarterectomy plain. A ____________ Hemashield Dacron patch was brought onto the field, was sewn as a patch angioplasty.  Prior to completion of the anastomosis, the shunt was removed and the usual flush maneuvers were undertaken. The anastomosis was then completed and the external followed by the common followed the internal carotid artery clamp was removed.  Excellent flow characteristics were noted in the hand held Doppler in the internal and external carotid arteries.  The patient was given 15 mg of protamine to reverse heparin.  The wounds were irrigated with saline.  Hemostasis with electrocautery.  A 10 flat Jackson-Pratt drain was brought out through a separate stab incision and placed in the base of the wound.  The wound was closed with several interrupted 3-0 Vicryl sutures and reapproximated sternocleidomastoid over the carotid sheath. Next, the platysma was closed with a running 3-0 Vicryl suture suture.  Finally, the skin was closed with 4-0 subcuticular Vicryl suture. Sterile dressing was applied and the patient was taken to the recovery room in stable condition hemodynamically intact and neurologically intact. DD:  01/26/00 TD:  01/26/00 Job: [id number]

## 2011-07-14 ENCOUNTER — Encounter (HOSPITAL_COMMUNITY): Payer: Self-pay | Admitting: Anesthesiology

## 2011-07-14 ENCOUNTER — Inpatient Hospital Stay (HOSPITAL_COMMUNITY)
Admission: EM | Admit: 2011-07-14 | Discharge: 2011-07-19 | DRG: 905 | Disposition: A | Discharge status: Home / Self Care | Payer: Medicare PPO | Source: Ambulatory Visit | Attending: Orthopedic Surgery | Admitting: Orthopedic Surgery

## 2011-07-14 ENCOUNTER — Inpatient Hospital Stay (HOSPITAL_COMMUNITY): Payer: Medicare PPO | Admitting: Anesthesiology

## 2011-07-14 ENCOUNTER — Encounter (HOSPITAL_COMMUNITY)
Admission: EM | Disposition: A | Discharge status: Home / Self Care | Payer: Self-pay | Source: Ambulatory Visit | Attending: Orthopedic Surgery

## 2011-07-14 ENCOUNTER — Inpatient Hospital Stay
Admission: EM | Admit: 2011-07-14 | Payer: Self-pay | Source: Other Acute Inpatient Hospital | Admitting: Orthopedic Surgery

## 2011-07-14 DIAGNOSIS — I2510 Atherosclerotic heart disease of native coronary artery without angina pectoris: Secondary | ICD-10-CM | POA: Diagnosis present

## 2011-07-14 DIAGNOSIS — Z77090 Contact with and (suspected) exposure to asbestos: Secondary | ICD-10-CM

## 2011-07-14 DIAGNOSIS — I739 Peripheral vascular disease, unspecified: Secondary | ICD-10-CM | POA: Diagnosis present

## 2011-07-14 DIAGNOSIS — E785 Hyperlipidemia, unspecified: Secondary | ICD-10-CM | POA: Diagnosis present

## 2011-07-14 DIAGNOSIS — G5600 Carpal tunnel syndrome, unspecified upper limb: Secondary | ICD-10-CM | POA: Diagnosis present

## 2011-07-14 DIAGNOSIS — Z7901 Long term (current) use of anticoagulants: Secondary | ICD-10-CM

## 2011-07-14 DIAGNOSIS — W230XXA Caught, crushed, jammed, or pinched between moving objects, initial encounter: Secondary | ICD-10-CM | POA: Diagnosis present

## 2011-07-14 DIAGNOSIS — I10 Essential (primary) hypertension: Secondary | ICD-10-CM

## 2011-07-14 DIAGNOSIS — T79A19A Traumatic compartment syndrome of unspecified upper extremity, initial encounter: Secondary | ICD-10-CM

## 2011-07-14 DIAGNOSIS — I4891 Unspecified atrial fibrillation: Secondary | ICD-10-CM

## 2011-07-14 DIAGNOSIS — Z951 Presence of aortocoronary bypass graft: Secondary | ICD-10-CM

## 2011-07-14 DIAGNOSIS — Y998 Other external cause status: Secondary | ICD-10-CM

## 2011-07-14 DIAGNOSIS — S5780XA Crushing injury of unspecified forearm, initial encounter: Principal | ICD-10-CM | POA: Diagnosis present

## 2011-07-14 HISTORY — DX: Hyperlipidemia, unspecified: E78.5

## 2011-07-14 HISTORY — DX: Essential (primary) hypertension: I10

## 2011-07-14 HISTORY — DX: Permanent atrial fibrillation: I48.21

## 2011-07-14 HISTORY — PX: FASCIOTOMY: SHX132

## 2011-07-14 HISTORY — DX: Contact with and (suspected) exposure to asbestos: Z77.090

## 2011-07-14 HISTORY — DX: Endocarditis, valve unspecified: I38

## 2011-07-14 HISTORY — DX: Atherosclerotic heart disease of native coronary artery without angina pectoris: I25.10

## 2011-07-14 HISTORY — DX: Peripheral vascular disease, unspecified: I73.9

## 2011-07-14 LAB — MRSA PCR SCREENING: MRSA by PCR: NEGATIVE

## 2011-07-14 LAB — CBC
HCT: 38.3 % — ABNORMAL LOW (ref 39.0–52.0)
Hemoglobin: 13.1 g/dL (ref 13.0–17.0)
MCV: 94.3 fL (ref 78.0–100.0)
RBC: 4.06 MIL/uL — ABNORMAL LOW (ref 4.22–5.81)
WBC: 11.4 10*3/uL — ABNORMAL HIGH (ref 4.0–10.5)

## 2011-07-14 LAB — ABO/RH: ABO/RH(D): O POS

## 2011-07-14 SURGERY — FASCIOTOMY, UPPER EXTREMITY
Anesthesia: General | Site: Arm Lower | Laterality: Left | Wound class: Dirty or Infected

## 2011-07-14 MED ORDER — HYDROCODONE-ACETAMINOPHEN 5-325 MG PO TABS
1.0000 | ORAL_TABLET | ORAL | Status: DC | PRN
  Administered 2011-07-16: 1 via ORAL
  Filled 2011-07-14: qty 2
  Filled 2011-07-14: qty 1

## 2011-07-14 MED ORDER — METOCLOPRAMIDE HCL 5 MG/ML IJ SOLN
5.0000 mg | Freq: Three times a day (TID) | INTRAMUSCULAR | Status: DC | PRN
  Filled 2011-07-14: qty 2

## 2011-07-14 MED ORDER — CEFAZOLIN SODIUM-DEXTROSE 2-3 GM-% IV SOLR
2.0000 g | Freq: Four times a day (QID) | INTRAVENOUS | Status: AC
  Administered 2011-07-14 – 2011-07-15 (×3): 2 g via INTRAVENOUS
  Filled 2011-07-14 (×4): qty 50

## 2011-07-14 MED ORDER — MORPHINE SULFATE 2 MG/ML IJ SOLN: 0.0500 mg/kg | INTRAMUSCULAR | Status: DC | PRN

## 2011-07-14 MED ORDER — METOPROLOL TARTRATE 1 MG/ML IV SOLN
INTRAVENOUS | Status: AC
  Filled 2011-07-14: qty 5

## 2011-07-14 MED ORDER — CEFAZOLIN SODIUM 1-5 GM-% IV SOLN
INTRAVENOUS | Status: DC | PRN
  Administered 2011-07-14: 2 g via INTRAVENOUS

## 2011-07-14 MED ORDER — MIDAZOLAM HCL 5 MG/5ML IJ SOLN
INTRAMUSCULAR | Status: DC | PRN
  Administered 2011-07-14: 1 mg via INTRAVENOUS

## 2011-07-14 MED ORDER — OXYCODONE-ACETAMINOPHEN 5-325 MG PO TABS
1.0000 | ORAL_TABLET | ORAL | Status: DC | PRN
  Administered 2011-07-14 – 2011-07-17 (×9): 1 via ORAL
  Filled 2011-07-14: qty 2
  Filled 2011-07-14 (×2): qty 1
  Filled 2011-07-14: qty 2
  Filled 2011-07-14 (×2): qty 1

## 2011-07-14 MED ORDER — METOCLOPRAMIDE HCL 5 MG PO TABS
5.0000 mg | ORAL_TABLET | Freq: Three times a day (TID) | ORAL | Status: DC | PRN
  Filled 2011-07-14: qty 2

## 2011-07-14 MED ORDER — METOPROLOL TARTRATE 1 MG/ML IV SOLN: 2.5000 mg | Freq: Once | INTRAVENOUS | Status: DC

## 2011-07-14 MED ORDER — ONDANSETRON HCL 4 MG PO TABS: 4.0000 mg | ORAL_TABLET | Freq: Four times a day (QID) | ORAL | Status: DC | PRN

## 2011-07-14 MED ORDER — WARFARIN SODIUM 5 MG PO TABS
5.0000 mg | ORAL_TABLET | Freq: Once | ORAL | Status: DC
  Filled 2011-07-14: qty 1

## 2011-07-14 MED ORDER — LACTATED RINGERS IV SOLN
INTRAVENOUS | Status: DC | PRN
  Administered 2011-07-14 (×2): via INTRAVENOUS

## 2011-07-14 MED ORDER — PROPOFOL 10 MG/ML IV BOLUS
INTRAVENOUS | Status: DC | PRN
  Administered 2011-07-14: 200 mg via INTRAVENOUS

## 2011-07-14 MED ORDER — WARFARIN - PHARMACIST DOSING INPATIENT: Freq: Every day | Status: DC

## 2011-07-14 MED ORDER — SUCCINYLCHOLINE CHLORIDE 20 MG/ML IJ SOLN
INTRAMUSCULAR | Status: DC | PRN
  Administered 2011-07-14: 120 mg via INTRAVENOUS

## 2011-07-14 MED ORDER — FENTANYL CITRATE 0.05 MG/ML IJ SOLN
INTRAMUSCULAR | Status: DC | PRN
  Administered 2011-07-14 (×4): 50 ug via INTRAVENOUS

## 2011-07-14 MED ORDER — HYDROMORPHONE HCL PF 1 MG/ML IJ SOLN
0.2500 mg | INTRAMUSCULAR | Status: DC | PRN
  Administered 2011-07-14: 0.5 mg via INTRAVENOUS

## 2011-07-14 MED ORDER — ONDANSETRON HCL 4 MG/2ML IJ SOLN: 4.0000 mg | Freq: Four times a day (QID) | INTRAMUSCULAR | Status: DC | PRN

## 2011-07-14 MED ORDER — SODIUM CHLORIDE 0.9 % IV SOLN: INTRAVENOUS | Status: DC | PRN

## 2011-07-14 MED ORDER — ONDANSETRON HCL 4 MG/2ML IJ SOLN: 4.0000 mg | Freq: Once | INTRAMUSCULAR | Status: DC | PRN

## 2011-07-14 MED ORDER — METOPROLOL TARTRATE 1 MG/ML IV SOLN
2.5000 mg | Freq: Once | INTRAVENOUS | Status: AC | PRN
  Administered 2011-07-14: 2.5 mg via INTRAVENOUS
  Filled 2011-07-14: qty 5

## 2011-07-14 MED ORDER — HYDROMORPHONE HCL PF 1 MG/ML IJ SOLN: 0.5000 mg | INTRAMUSCULAR | Status: DC | PRN

## 2011-07-14 MED ORDER — 0.9 % SODIUM CHLORIDE (POUR BTL) OPTIME
TOPICAL | Status: DC | PRN
  Administered 2011-07-14: 1000 mL

## 2011-07-14 MED ORDER — LIDOCAINE HCL (CARDIAC) 20 MG/ML IV SOLN
INTRAVENOUS | Status: DC | PRN
  Administered 2011-07-14: 100 mg via INTRAVENOUS

## 2011-07-14 MED ORDER — SODIUM CHLORIDE 0.9 % IV SOLN: INTRAVENOUS | Status: DC

## 2011-07-14 SURGICAL SUPPLY — 49 items
BANDAGE ELASTIC 4 VELCRO ST LF (GAUZE/BANDAGES/DRESSINGS) ×2 IMPLANT
BANDAGE ELASTIC 6 VELCRO ST LF (GAUZE/BANDAGES/DRESSINGS) ×2 IMPLANT
BANDAGE GAUZE ELAST BULKY 4 IN (GAUZE/BANDAGES/DRESSINGS) ×2 IMPLANT
BNDG COHESIVE 4X5 TAN STRL (GAUZE/BANDAGES/DRESSINGS) ×1 IMPLANT
CANISTER WOUND CARE 500ML ATS (WOUND CARE) ×1 IMPLANT
CLOTH BEACON ORANGE TIMEOUT ST (SAFETY) ×2 IMPLANT
COVER SURGICAL LIGHT HANDLE (MISCELLANEOUS) ×2 IMPLANT
CUFF TOURNIQUET SINGLE 24IN (TOURNIQUET CUFF) IMPLANT
CUFF TOURNIQUET SINGLE 34IN LL (TOURNIQUET CUFF) IMPLANT
DRAPE INCISE IOBAN 66X45 STRL (DRAPES) ×3 IMPLANT
DRAPE ORTHO SPLIT 77X108 STRL (DRAPES)
DRAPE SURG ORHT 6 SPLT 77X108 (DRAPES) ×2 IMPLANT
DRAPE U-SHAPE 47X51 STRL (DRAPES) ×2 IMPLANT
DRSG ADAPTIC 3X8 NADH LF (GAUZE/BANDAGES/DRESSINGS) ×1 IMPLANT
DRSG PAD ABDOMINAL 8X10 ST (GAUZE/BANDAGES/DRESSINGS) ×1 IMPLANT
DRSG VAC ATS LRG SENSATRAC (GAUZE/BANDAGES/DRESSINGS) IMPLANT
DRSG VAC ATS MED SENSATRAC (GAUZE/BANDAGES/DRESSINGS) ×1 IMPLANT
DRSG VAC ATS SM SENSATRAC (GAUZE/BANDAGES/DRESSINGS) IMPLANT
DURAPREP 26ML APPLICATOR (WOUND CARE) ×2 IMPLANT
ELECT REM PT RETURN 9FT ADLT (ELECTROSURGICAL) ×2
ELECTRODE REM PT RTRN 9FT ADLT (ELECTROSURGICAL) ×1 IMPLANT
FLUID NSS /IRRIG 3000 ML XXX (IV SOLUTION) ×1 IMPLANT
GLOVE BIOGEL PI IND STRL 9 (GLOVE) ×1 IMPLANT
GLOVE BIOGEL PI INDICATOR 9 (GLOVE) ×1
GLOVE SURG ORTHO 9.0 STRL STRW (GLOVE) ×2 IMPLANT
GOWN PREVENTION PLUS XLARGE (GOWN DISPOSABLE) ×2 IMPLANT
GOWN SRG XL XLNG 56XLVL 4 (GOWN DISPOSABLE) ×2 IMPLANT
GOWN STRL NON-REIN XL XLG LVL4 (GOWN DISPOSABLE) ×4
HANDPIECE INTERPULSE COAX TIP (DISPOSABLE) ×2
KIT BASIN OR (CUSTOM PROCEDURE TRAY) ×2 IMPLANT
KIT ROOM TURNOVER OR (KITS) ×2 IMPLANT
MANIFOLD NEPTUNE II (INSTRUMENTS) ×2 IMPLANT
NS IRRIG 1000ML POUR BTL (IV SOLUTION) ×2 IMPLANT
PACK GENERAL/GYN (CUSTOM PROCEDURE TRAY) ×1 IMPLANT
PACK ORTHO EXTREMITY (CUSTOM PROCEDURE TRAY) ×1 IMPLANT
PAD ARMBOARD 7.5X6 YLW CONV (MISCELLANEOUS) ×4 IMPLANT
SET HNDPC FAN SPRY TIP SCT (DISPOSABLE) IMPLANT
SET MONITOR QUICK PRESSURE (MISCELLANEOUS) IMPLANT
SPONGE GAUZE 4X4 12PLY (GAUZE/BANDAGES/DRESSINGS) ×1 IMPLANT
SPONGE LAP 18X18 X RAY DECT (DISPOSABLE) ×3 IMPLANT
STAPLER VISISTAT 35W (STAPLE) IMPLANT
STOCKINETTE IMPERVIOUS 9X36 MD (GAUZE/BANDAGES/DRESSINGS) ×1 IMPLANT
SUT ETHILON 2 0 FSLX (SUTURE) IMPLANT
SUT VIC AB 0 CTB1 27 (SUTURE) IMPLANT
SUT VIC AB 2-0 CTB1 (SUTURE) IMPLANT
TOWEL OR 17X24 6PK STRL BLUE (TOWEL DISPOSABLE) ×2 IMPLANT
TOWEL OR 17X26 10 PK STRL BLUE (TOWEL DISPOSABLE) ×2 IMPLANT
WATER STERILE IRR 1000ML POUR (IV SOLUTION) ×2 IMPLANT
YANKAUER SUCT BULB TIP NO VENT (SUCTIONS) ×1 IMPLANT

## 2011-07-14 NOTE — Progress Notes (Signed)
Dr. Lajoyce Corners assessed patient prior to starting next surgery.  At that time, patient was still oozing, but after redressing VAC the ooze is much less than before.

## 2011-07-14 NOTE — Anesthesia Postprocedure Evaluation (Signed)
  Anesthesia Post-op Note  Patient: Connor Ford  Procedure(s) Performed: Procedure(s) (LRB): FASCIOTOMY (Left)  Patient Location: PACU  Anesthesia Type: General  Level of Consciousness: awake  Airway and Oxygen Therapy: Patient Spontanous Breathing  Post-op Pain: mild  Post-op Assessment: Post-op Vital signs reviewed, Patient's Cardiovascular Status Stable, Respiratory Function Stable, Patent Airway, No signs of Nausea or vomiting and Pain level controlled  Post-op Vital Signs: stable  Complications: No apparent anesthesia complications

## 2011-07-14 NOTE — Progress Notes (Addendum)
ANTICOAGULATION CONSULT NOTE - Initial Consult  Pharmacy Consult for coumadin Indication: VTE prophylaxis  No Known Allergies  Patient Measurements:   Heparin Dosing Weight:   Vital Signs: Temp: 98.2 F (36.8 C) (04/17 2030) BP: 132/58 mmHg (04/17 2045) Pulse Rate: 57  (04/17 2045)  Labs:  Basename 07/14/11 1949  HGB 13.1  HCT 38.3*  PLT 167  APTT --  LABPROT --  INR --  HEPARINUNFRC --  CREATININE --  CKTOTAL --  CKMB --  TROPONINI --   CrCl is unknown because no creatinine reading has been taken and the patient has no height on file.  Medical History: No past medical history on file.  Medications:  Scheduled:    .  ceFAZolin (ANCEF) IV  2 g Intravenous Q6H  . metoprolol      . DISCONTD: metoprolol  2.5 mg Intravenous Once   Infusions:    . sodium chloride      Assessment: 66 yo male s/p fasciotomy of left forearm will be put on coumadin for VTE prophylaxis.  Was on coumadin 5mg  po qday PTA.  Had CABG previously.  INR today 2.84.  Patient said he already took coumadin this am. Goal of Therapy:  INR 2-3   Plan:  1) No coumadin tonight 2) Daily PT/INR  Macy Polio, Tsz-Yin 07/14/2011,9:25 PM

## 2011-07-14 NOTE — Progress Notes (Signed)
Dr. Lajoyce Corners at bedside.  Multiple attempts made to seal VAC.  Oozing and blockage to VAC suction complicating resealing VAC.  Attempting to connect to cont. Wall suction at 125 mmhg to obtain consistant seal and suction.

## 2011-07-14 NOTE — Progress Notes (Signed)
Pt. Received from OR. Report obtained from CRNA.  Pt. Awake, appropriate, oozing from Left arm VAC dsg.  Pressure applied to dressing to control leak and attempt to reseal VAC.
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
## 2011-07-14 NOTE — Progress Notes (Signed)
Pt. Having breakthrough runs of Afib with RVR to 140s.  BP 122/62. Dr. Noreene Larsson at bedside.  Order received for lopressor.  Called Dr. Lajoyce Corners in OR for an upgrade to ICU due to acute blood loss and Afib with RVR.

## 2011-07-14 NOTE — Anesthesia Preprocedure Evaluation (Signed)
Anesthesia Evaluation  Patient identified by MRN, date of birth, ID band Patient awake    Reviewed: Allergy & Precautions, H&P , NPO status , Patient's Chart, lab work & pertinent test results  Airway Mallampati: I TM Distance: >3 FB Neck ROM: Full    Dental  (+) Teeth Intact and Dental Advisory Given   Pulmonary  breath sounds clear to auscultation        Cardiovascular + dysrhythmias Atrial Fibrillation Rhythm:Irregular Rate:Normal     Neuro/Psych    GI/Hepatic   Endo/Other    Renal/GU      Musculoskeletal   Abdominal   Peds  Hematology   Anesthesia Other Findings   Reproductive/Obstetrics                           Anesthesia Physical Anesthesia Plan  ASA: III  Anesthesia Plan: General   Post-op Pain Management:    Induction: Intravenous and Rapid sequence  Airway Management Planned: Oral ETT  Additional Equipment:   Intra-op Plan:   Post-operative Plan: Extubation in OR  Informed Consent: I have reviewed the patients History and Physical, chart, labs and discussed the procedure including the risks, benefits and alternatives for the proposed anesthesia with the patient or authorized representative who has indicated his/her understanding and acceptance.   Dental advisory given  Plan Discussed with: CRNA, Anesthesiologist and Surgeon  Anesthesia Plan Comments:         Anesthesia Quick Evaluation
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
## 2011-07-14 NOTE — Anesthesia Procedure Notes (Signed)
Procedure Name: Intubation Date/Time: 07/14/2011 3:30 PM Performed by: Molli Hazard Pre-anesthesia Checklist: Patient identified, Emergency Drugs available, Suction available and Patient being monitored Patient Re-evaluated:Patient Re-evaluated prior to inductionOxygen Delivery Method: Circle system utilized Preoxygenation: Pre-oxygenation with 100% oxygen Intubation Type: IV induction and Cricoid Pressure applied Laryngoscope Size: Miller and 2 Grade View: Grade I Tube type: Oral Tube size: 8.0 mm Number of attempts: 1 Airway Equipment and Method: Stylet Placement Confirmation: ETT inserted through vocal cords under direct vision,  positive ETCO2 and breath sounds checked- equal and bilateral Secured at: 24 cm Tube secured with: Tape Dental Injury: Teeth and Oropharynx as per pre-operative assessment

## 2011-07-14 NOTE — Preoperative (Signed)
Beta Blockers   Reason not to administer Beta Blockers:Not Applicable 

## 2011-07-14 NOTE — Transfer of Care (Signed)
Immediate Anesthesia Transfer of Care Note  Patient: Connor Ford  Procedure(s) Performed: Procedure(s) (LRB): FASCIOTOMY (Left)  Patient Location: PACU  Anesthesia Type: General  Level of Consciousness: awake, alert  and oriented  Airway & Oxygen Therapy: Patient connected to nasal cannula oxygen  Post-op Assessment: Report given to PACU RN, Post -op Vital signs reviewed and stable and Patient moving all extremities X 4  Post vital signs: Reviewed and stable  Complications: No apparent anesthesia complications

## 2011-07-15 ENCOUNTER — Encounter (HOSPITAL_COMMUNITY): Payer: Self-pay

## 2011-07-15 DIAGNOSIS — I10 Essential (primary) hypertension: Secondary | ICD-10-CM

## 2011-07-15 DIAGNOSIS — I359 Nonrheumatic aortic valve disorder, unspecified: Secondary | ICD-10-CM

## 2011-07-15 DIAGNOSIS — I4891 Unspecified atrial fibrillation: Secondary | ICD-10-CM

## 2011-07-15 LAB — CBC
MCHC: 34.3 g/dL (ref 30.0–36.0)
MCV: 94.3 fL (ref 78.0–100.0)
Platelets: 157 10*3/uL (ref 150–400)
RDW: 14 % (ref 11.5–15.5)
WBC: 9.3 10*3/uL (ref 4.0–10.5)

## 2011-07-15 LAB — BASIC METABOLIC PANEL
Calcium: 8.2 mg/dL — ABNORMAL LOW (ref 8.4–10.5)
Creatinine, Ser: 0.81 mg/dL (ref 0.50–1.35)
GFR calc Af Amer: >90 mL/min (ref >90)

## 2011-07-15 LAB — PROTIME-INR: Prothrombin Time: 33.1 seconds — ABNORMAL HIGH (ref 11.6–15.2)

## 2011-07-15 MED ORDER — METOPROLOL TARTRATE 1 MG/ML IV SOLN
INTRAVENOUS | Status: AC
  Administered 2011-07-15: 2.5 mg via INTRAVENOUS
  Filled 2011-07-15: qty 5

## 2011-07-15 MED ORDER — METOPROLOL TARTRATE 1 MG/ML IV SOLN
2.5000 mg | Freq: Once | INTRAVENOUS | Status: AC
  Administered 2011-07-15: 2.5 mg via INTRAVENOUS

## 2011-07-15 MED ORDER — LACTATED RINGERS IV SOLN
INTRAVENOUS | Status: DC
  Administered 2011-07-15 (×2): via INTRAVENOUS

## 2011-07-15 MED ORDER — DILTIAZEM HCL 100 MG IV SOLR
5.0000 mg/h | INTRAVENOUS | Status: DC
  Administered 2011-07-15: 5 mg/h via INTRAVENOUS
  Administered 2011-07-16: 10 mg/h via INTRAVENOUS
  Administered 2011-07-17: 15 mg/h via INTRAVENOUS
  Administered 2011-07-18 (×2): 10 mg/h via INTRAVENOUS
  Filled 2011-07-15 (×6): qty 100

## 2011-07-15 MED ORDER — METOPROLOL TARTRATE 1 MG/ML IV SOLN
2.5000 mg | Freq: Once | INTRAVENOUS | Status: AC
  Administered 2011-07-15 (×2): 2.5 mg via INTRAVENOUS

## 2011-07-15 MED ORDER — DILTIAZEM HCL ER COATED BEADS 180 MG PO CP24
180.0000 mg | ORAL_CAPSULE | Freq: Every day | ORAL | Status: DC
  Administered 2011-07-15: 180 mg via ORAL
  Filled 2011-07-15 (×2): qty 1
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
## 2011-07-15 NOTE — Progress Notes (Signed)
A. Fib with rate of 120-140.  BP 134/84 (93).  MD Lajoyce Corners made aware.  2.5 mg lopressor ordered, given.

## 2011-07-15 NOTE — Progress Notes (Signed)
Patient ID: Connor Ford, male   DOB: 08/25/1945, 66 y.o.   MRN: [id number] POD 1, compartment release left arm and CTR.  Patient with numbness in median nerve distribution, expected due to compression in carpal tunnel.  Plan for repeat I&D tomorrow, there was extensive muscle necrosis and liquification.

## 2011-07-15 NOTE — Progress Notes (Signed)
UR COMPLETED  

## 2011-07-15 NOTE — Progress Notes (Signed)
  Echocardiogram 2D Echocardiogram has been performed.  Connor Ford 07/15/2011, 5:05 PM

## 2011-07-15 NOTE — Progress Notes (Signed)
ANTICOAGULATION CONSULT NOTE - Initial Consult  Pharmacy Consult for coumadin Indication: A.fib  No Known Allergies  Patient Measurements: Height: 6\' 4"  (193 cm) Weight: 193 lb 12.8 oz (87.907 kg) IBW/kg (Calculated) : 86.8  Heparin Dosing Weight:   Vital Signs: Temp: 98 F (36.7 C) (04/18 1140) Temp src: Oral (04/18 1140) BP: 111/67 mmHg (04/18 1140) Pulse Rate: 52  (04/18 1140)  Labs:  Basename 07/15/11 0831 07/15/11 0830 07/14/11 2141 07/14/11 1949  HGB -- 11.3* -- 13.1  HCT -- 32.9* -- 38.3*  PLT -- 157 -- 167  APTT -- -- -- --  LABPROT 33.1* -- 30.3* --  INR 3.18* -- 2.84* --  HEPARINUNFRC -- -- -- --  CREATININE -- 0.81 -- --  CKTOTAL -- -- -- --  CKMB -- -- -- --  TROPONINI -- -- -- --   Estimated Creatinine Clearance: 111.6 ml/min (by C-G formula based on Cr of 0.81).  Medical History: Past Medical History  Diagnosis Date  . Permanent atrial fibrillation     With LBBB pattern. On Chronic Coumadin  . CAD (coronary artery disease)     s/p CABG 02/1999 with LIMA-LAD, SVG-D1, SVG-dRCA. EF 45% at time of CABG, echo in EPIC 2009 was 50-55%  . 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 Last echo 03/2007 with mild AS, mild-mod AR, mild-mod MR, mild MS   . Asbestos exposure   . Peripheral vascular disease     a) reported hx of infrainguinal arterial occlusive disease. b) Bilateral CEA  in 2001 with RCEA operation complicated by neck hematoma and l forearm hematoma/compartment syndrome s/p fasciotomy  . HTN (hypertension)   . Hyperlipidemia     Medications:  Scheduled:     .  ceFAZolin (ANCEF) IV  2 g Intravenous Q6H  . metoprolol      . metoprolol  2.5 mg Intravenous Once  . metoprolol  2.5 mg Intravenous Once  . Warfarin - Pharmacist Dosing Inpatient   Does not apply q1800  . DISCONTD: diltiazem  180 mg  Oral Daily  . DISCONTD: metoprolol  2.5 mg Intravenous Once  . DISCONTD: warfarin  5 mg Oral Once   Infusions:     . sodium chloride 20 mL/hr at 07/14/11 2130  . diltiazem (CARDIZEM) infusion 5 mg/hr (07/15/11 1235)  . lactated ringers 125 mL/hr at 07/15/11 1100    Assessment: 66 yo male on chronic Coumadin therapy for afib; with therapeutic INR at admission.  No Coumadin given in hospital since admission.  INR increased to 3.18.  Per Dr. Jenel Lucks note from today, will try to keep INR closer to 2 for the peri-op period.  Goal of Therapy:  INR 2-3 (target closer to 2)   Plan:  1) No coumadin tonight 2) Daily PT/INR  Annice Jolly C 07/15/2011,12:58 PM

## 2011-07-15 NOTE — Progress Notes (Signed)
A Fib HR 140s.  MD Lajoyce Corners made aware.  2.5 lopressor ordered, given. LR at 125 ordered continuously, started.  Will continue to monitor.

## 2011-07-16 ENCOUNTER — Encounter (HOSPITAL_COMMUNITY)
Admission: EM | Disposition: A | Discharge status: Home / Self Care | Payer: Self-pay | Source: Ambulatory Visit | Attending: Orthopedic Surgery

## 2011-07-16 ENCOUNTER — Inpatient Hospital Stay (HOSPITAL_COMMUNITY): Payer: Medicare PPO | Admitting: Certified Registered"

## 2011-07-16 ENCOUNTER — Encounter (HOSPITAL_COMMUNITY): Payer: Self-pay | Admitting: Certified Registered"

## 2011-07-16 HISTORY — PX: I & D EXTREMITY: SHX5045

## 2011-07-16 LAB — BASIC METABOLIC PANEL
BUN: 8 mg/dL (ref 6–23)
Calcium: 8.1 mg/dL — ABNORMAL LOW (ref 8.4–10.5)
Creatinine, Ser: 0.79 mg/dL (ref 0.50–1.35)
GFR calc non Af Amer: >90 mL/min (ref >90)
Glucose, Bld: 108 mg/dL — ABNORMAL HIGH (ref 70–99)
Sodium: 137 mEq/L (ref 135–145)

## 2011-07-16 LAB — CBC
MCH: 31.7 pg (ref 26.0–34.0)
MCHC: 33.7 g/dL (ref 30.0–36.0)
MCV: 94.2 fL (ref 78.0–100.0)
Platelets: 143 10*3/uL — ABNORMAL LOW (ref 150–400)

## 2011-07-16 LAB — PROTIME-INR: Prothrombin Time: 24.8 seconds — ABNORMAL HIGH (ref 11.6–15.2)

## 2011-07-16 SURGERY — IRRIGATION AND DEBRIDEMENT EXTREMITY
Anesthesia: General | Site: Arm Lower | Laterality: Left | Wound class: Contaminated

## 2011-07-16 MED ORDER — FUROSEMIDE 40 MG PO TABS
40.0000 mg | ORAL_TABLET | Freq: Every day | ORAL | Status: DC
  Administered 2011-07-17 – 2011-07-19 (×3): 40 mg via ORAL
  Filled 2011-07-16 (×3): qty 1

## 2011-07-16 MED ORDER — DEXTROSE 5 % IV SOLN
500.0000 mg | Freq: Four times a day (QID) | INTRAVENOUS | Status: DC | PRN
  Filled 2011-07-16: qty 5

## 2011-07-16 MED ORDER — DIGOXIN 125 MCG PO TABS
125.0000 ug | ORAL_TABLET | Freq: Every day | ORAL | Status: DC
  Administered 2011-07-17 – 2011-07-19 (×3): 125 ug via ORAL
  Filled 2011-07-16 (×3): qty 1

## 2011-07-16 MED ORDER — ATORVASTATIN CALCIUM 20 MG PO TABS
20.0000 mg | ORAL_TABLET | Freq: Every day | ORAL | Status: DC
  Administered 2011-07-17 – 2011-07-18 (×2): 20 mg via ORAL
  Filled 2011-07-16 (×3): qty 1

## 2011-07-16 MED ORDER — HYDROMORPHONE HCL PF 1 MG/ML IJ SOLN: 0.5000 mg | INTRAMUSCULAR | Status: DC | PRN

## 2011-07-16 MED ORDER — ONDANSETRON HCL 4 MG/2ML IJ SOLN: 4.0000 mg | Freq: Four times a day (QID) | INTRAMUSCULAR | Status: DC | PRN

## 2011-07-16 MED ORDER — PROPOFOL 10 MG/ML IV EMUL
INTRAVENOUS | Status: DC | PRN
  Administered 2011-07-16: 200 mg via INTRAVENOUS

## 2011-07-16 MED ORDER — LACTATED RINGERS IV SOLN
INTRAVENOUS | Status: DC | PRN
  Administered 2011-07-16: 16:00:00 via INTRAVENOUS

## 2011-07-16 MED ORDER — SODIUM CHLORIDE 0.9 % IV SOLN
INTRAVENOUS | Status: DC
  Administered 2011-07-18: 16:00:00 via INTRAVENOUS

## 2011-07-16 MED ORDER — HYDROCODONE-ACETAMINOPHEN 5-325 MG PO TABS: 1.0000 | ORAL_TABLET | ORAL | Status: DC | PRN

## 2011-07-16 MED ORDER — LIDOCAINE HCL (CARDIAC) 20 MG/ML IV SOLN
INTRAVENOUS | Status: DC | PRN
  Administered 2011-07-16: 50 mg via INTRAVENOUS

## 2011-07-16 MED ORDER — 0.9 % SODIUM CHLORIDE (POUR BTL) OPTIME
TOPICAL | Status: DC | PRN
  Administered 2011-07-16: 1000 mL

## 2011-07-16 MED ORDER — METHOCARBAMOL 500 MG PO TABS
500.0000 mg | ORAL_TABLET | Freq: Four times a day (QID) | ORAL | Status: DC | PRN
  Filled 2011-07-16: qty 1

## 2011-07-16 MED ORDER — OXYCODONE-ACETAMINOPHEN 5-325 MG PO TABS
1.0000 | ORAL_TABLET | ORAL | Status: DC | PRN
  Administered 2011-07-17 (×2): 1 via ORAL
  Filled 2011-07-16 (×4): qty 1

## 2011-07-16 MED ORDER — POTASSIUM CHLORIDE CRYS ER 10 MEQ PO TBCR
10.0000 meq | EXTENDED_RELEASE_TABLET | Freq: Every day | ORAL | Status: DC
  Administered 2011-07-17 – 2011-07-19 (×3): 10 meq via ORAL
  Filled 2011-07-16 (×3): qty 1

## 2011-07-16 MED ORDER — SIMVASTATIN 40 MG PO TABS: 40.0000 mg | ORAL_TABLET | Freq: Every evening | ORAL | Status: DC

## 2011-07-16 MED ORDER — CEFAZOLIN SODIUM 1-5 GM-% IV SOLN
1.0000 g | Freq: Four times a day (QID) | INTRAVENOUS | Status: AC
  Administered 2011-07-16 – 2011-07-17 (×3): 1 g via INTRAVENOUS
  Filled 2011-07-16 (×3): qty 50

## 2011-07-16 MED ORDER — MIDAZOLAM HCL 5 MG/5ML IJ SOLN
INTRAMUSCULAR | Status: DC | PRN
  Administered 2011-07-16: 2 mg via INTRAVENOUS

## 2011-07-16 MED ORDER — FENTANYL CITRATE 0.05 MG/ML IJ SOLN
INTRAMUSCULAR | Status: DC | PRN
  Administered 2011-07-16: 100 ug via INTRAVENOUS
  Administered 2011-07-16: 50 ug via INTRAVENOUS

## 2011-07-16 MED ORDER — HYDROMORPHONE HCL PF 1 MG/ML IJ SOLN: 0.2500 mg | INTRAMUSCULAR | Status: DC | PRN

## 2011-07-16 MED ORDER — METOCLOPRAMIDE HCL 5 MG PO TABS
5.0000 mg | ORAL_TABLET | Freq: Three times a day (TID) | ORAL | Status: DC | PRN
  Filled 2011-07-16: qty 2

## 2011-07-16 MED ORDER — DILTIAZEM HCL ER 180 MG PO CP24: 180.0000 mg | ORAL_CAPSULE | Freq: Every day | ORAL | Status: DC

## 2011-07-16 MED ORDER — ONDANSETRON HCL 4 MG PO TABS: 4.0000 mg | ORAL_TABLET | Freq: Four times a day (QID) | ORAL | Status: DC | PRN

## 2011-07-16 MED ORDER — SODIUM CHLORIDE 0.9 % IR SOLN
Status: DC | PRN
  Administered 2011-07-16: 6000 mL

## 2011-07-16 MED ORDER — RAMIPRIL 5 MG PO CAPS
5.0000 mg | ORAL_CAPSULE | Freq: Every day | ORAL | Status: DC
  Administered 2011-07-17 – 2011-07-19 (×3): 5 mg via ORAL
  Filled 2011-07-16 (×3): qty 1

## 2011-07-16 MED ORDER — METOCLOPRAMIDE HCL 5 MG/ML IJ SOLN
5.0000 mg | Freq: Three times a day (TID) | INTRAMUSCULAR | Status: DC | PRN
  Filled 2011-07-16: qty 2

## 2011-07-16 SURGICAL SUPPLY — 49 items
BANDAGE GAUZE ELAST BULKY 4 IN (GAUZE/BANDAGES/DRESSINGS) ×1 IMPLANT
BLADE SURG 10 STRL SS (BLADE) ×2 IMPLANT
BNDG COHESIVE 4X5 TAN STRL (GAUZE/BANDAGES/DRESSINGS) ×2 IMPLANT
BNDG COHESIVE 6X5 TAN STRL LF (GAUZE/BANDAGES/DRESSINGS) ×3 IMPLANT
BNDG GAUZE STRTCH 6 (GAUZE/BANDAGES/DRESSINGS) ×6 IMPLANT
CLOTH BEACON ORANGE TIMEOUT ST (SAFETY) ×2 IMPLANT
COTTON STERILE ROLL (GAUZE/BANDAGES/DRESSINGS) ×2 IMPLANT
COVER SURGICAL LIGHT HANDLE (MISCELLANEOUS) ×2 IMPLANT
CUFF TOURNIQUET SINGLE 18IN (TOURNIQUET CUFF) ×1 IMPLANT
CUFF TOURNIQUET SINGLE 24IN (TOURNIQUET CUFF) IMPLANT
CUFF TOURNIQUET SINGLE 34IN LL (TOURNIQUET CUFF) IMPLANT
CUFF TOURNIQUET SINGLE 44IN (TOURNIQUET CUFF) IMPLANT
DRAPE U-SHAPE 47X51 STRL (DRAPES) ×2 IMPLANT
DRSG ADAPTIC 3X8 NADH LF (GAUZE/BANDAGES/DRESSINGS) ×2 IMPLANT
DRSG PAD ABDOMINAL 8X10 ST (GAUZE/BANDAGES/DRESSINGS) ×1 IMPLANT
DURAPREP 26ML APPLICATOR (WOUND CARE) ×2 IMPLANT
ELECT CAUTERY BLADE 6.4 (BLADE) ×1 IMPLANT
ELECT REM PT RETURN 9FT ADLT (ELECTROSURGICAL)
ELECTRODE REM PT RTRN 9FT ADLT (ELECTROSURGICAL) IMPLANT
GLOVE BIOGEL PI IND STRL 9 (GLOVE) ×1 IMPLANT
GLOVE BIOGEL PI INDICATOR 9 (GLOVE) ×1
GLOVE SURG ORTHO 9.0 STRL STRW (GLOVE) ×2 IMPLANT
GOWN PREVENTION PLUS XLARGE (GOWN DISPOSABLE) ×2 IMPLANT
GOWN SRG XL XLNG 56XLVL 4 (GOWN DISPOSABLE) ×1 IMPLANT
GOWN STRL NON-REIN XL XLG LVL4 (GOWN DISPOSABLE) ×2
HANDPIECE INTERPULSE COAX TIP (DISPOSABLE)
KIT BASIN OR (CUSTOM PROCEDURE TRAY) ×2 IMPLANT
KIT ROOM TURNOVER OR (KITS) ×2 IMPLANT
MANIFOLD NEPTUNE II (INSTRUMENTS) ×2 IMPLANT
MATRIX SURGICAL PSMX 10X15CM (Tissue) ×1 IMPLANT
MICROMATRIX 500MG (Tissue) ×2 IMPLANT
NS IRRIG 1000ML POUR BTL (IV SOLUTION) ×2 IMPLANT
PACK ORTHO EXTREMITY (CUSTOM PROCEDURE TRAY) ×2 IMPLANT
PAD ARMBOARD 7.5X6 YLW CONV (MISCELLANEOUS) ×4 IMPLANT
PADDING CAST COTTON 6X4 STRL (CAST SUPPLIES) ×2 IMPLANT
SET HNDPC FAN SPRY TIP SCT (DISPOSABLE) IMPLANT
SOLUTION PARTIC MCRMTRX 500MG (Tissue) IMPLANT
SPONGE GAUZE 4X4 12PLY (GAUZE/BANDAGES/DRESSINGS) ×2 IMPLANT
SPONGE LAP 18X18 X RAY DECT (DISPOSABLE) ×2 IMPLANT
STOCKINETTE IMPERVIOUS 9X36 MD (GAUZE/BANDAGES/DRESSINGS) ×2 IMPLANT
SUT ETHIBOND NAB CT1 #1 30IN (SUTURE) ×1 IMPLANT
SUT ETHILON 2 0 PSLX (SUTURE) ×4 IMPLANT
TOWEL OR 17X24 6PK STRL BLUE (TOWEL DISPOSABLE) ×2 IMPLANT
TOWEL OR 17X26 10 PK STRL BLUE (TOWEL DISPOSABLE) ×2 IMPLANT
TUBE ANAEROBIC SPECIMEN COL (MISCELLANEOUS) IMPLANT
TUBE CONNECTING 12X1/4 (SUCTIONS) ×2 IMPLANT
UNDERPAD 30X30 INCONTINENT (UNDERPADS AND DIAPERS) ×2 IMPLANT
WATER STERILE IRR 1000ML POUR (IV SOLUTION) ×2 IMPLANT
YANKAUER SUCT BULB TIP NO VENT (SUCTIONS) ×2 IMPLANT

## 2011-07-16 NOTE — Interval H&P Note (Signed)
History and Physical Interval Note:  07/16/2011 6:08 AM  Connor Ford  has presented today for surgery, with the diagnosis of Compartment Syndrome Left Forearm  The various methods of treatment have been discussed with the patient and family. After consideration of risks, benefits and other options for treatment, the patient has consented to  Procedure(s) (LRB): IRRIGATION AND DEBRIDEMENT EXTREMITY (Left) as a surgical intervention .  The patients' history has been reviewed, patient examined, no change in status, stable for surgery.  I have reviewed the patients' chart and labs.  Questions were answered to the patient's satisfaction.     Terrea Bruster V

## 2011-07-16 NOTE — Preoperative (Signed)
Beta Blockers   Reason not to administer Beta Blockers:Not Applicable 

## 2011-07-16 NOTE — Progress Notes (Signed)
Orthopedic Tech Progress Note Patient Details:  Connor Ford February 07, 1946 [id number]  Patient ID: Connor Ford, male   DOB: 03-03-1946, 66 y.o.   MRN: [id number]   Connor Ford 07/16/2011, 6:47 PM Viewed order from rn order list

## 2011-07-16 NOTE — Progress Notes (Signed)
Orthopedic Tech Progress Note Patient Details:  Connor Ford 10-Jul-1945 [id number]  Other Ortho Devices Type of Ortho Device: Other (comment) (mission sling) Ortho Device Location: left arm Ortho Device Interventions: Application   Nikki Dom 07/16/2011, 6:47 PM

## 2011-07-16 NOTE — Anesthesia Preprocedure Evaluation (Addendum)
Anesthesia Evaluation  Patient identified by MRN, date of birth, ID band Patient awake    Reviewed: Allergy & Precautions, H&P , NPO status , Patient's Chart, lab work & pertinent test results  Airway Mallampati: II  Neck ROM: full    Dental   Pulmonary former smoker         Cardiovascular hypertension, + CAD and + CABG + dysrhythmias Atrial Fibrillation     Neuro/Psych    GI/Hepatic   Endo/Other    Renal/GU      Musculoskeletal   Abdominal   Peds  Hematology   Anesthesia Other Findings   Reproductive/Obstetrics                           Anesthesia Physical Anesthesia Plan  ASA: III  Anesthesia Plan: General   Post-op Pain Management:    Induction: Intravenous  Airway Management Planned: LMA  Additional Equipment:   Intra-op Plan:   Post-operative Plan:   Informed Consent: I have reviewed the patients History and Physical, chart, labs and discussed the procedure including the risks, benefits and alternatives for the proposed anesthesia with the patient or authorized representative who has indicated his/her understanding and acceptance.     Plan Discussed with: CRNA and Surgeon  Anesthesia Plan Comments:         Anesthesia Quick Evaluation

## 2011-07-16 NOTE — H&P (View-Only) (Signed)
Patient ID: Connor Ford, male   DOB: 04/11/1945, 65 y.o.   MRN: [id number] POD 1, compartment release left arm and CTR.  Patient with numbness in median nerve distribution, expected due to compression in carpal tunnel.  Plan for repeat I&D tomorrow, there was extensive muscle necrosis and liquification.   

## 2011-07-16 NOTE — Anesthesia Procedure Notes (Signed)
Procedure Name: LMA Insertion Date/Time: 07/16/2011 4:19 PM Performed by: Gwenyth Allegra Pre-anesthesia Checklist: Patient identified, Timeout performed, Emergency Drugs available, Suction available and Patient being monitored Patient Re-evaluated:Patient Re-evaluated prior to inductionOxygen Delivery Method: Circle system utilized Preoxygenation: Pre-oxygenation with 100% oxygen Intubation Type: IV induction Ventilation: Mask ventilation without difficulty LMA: LMA inserted LMA Size: 4.0 Number of attempts: 1 Placement Confirmation: positive ETCO2 and breath sounds checked- equal and bilateral Tube secured with: Tape Dental Injury: Teeth and Oropharynx as per pre-operative assessment

## 2011-07-16 NOTE — Op Note (Signed)
OPERATIVE REPORT  DATE OF SURGERY: 07/16/2011  PATIENT:  Connor Ford,  66 y.o. male  PRE-OPERATIVE DIAGNOSIS:  Compartment Syndrome Left Forearm  POST-OPERATIVE DIAGNOSIS:  Compartment Syndrome Left Forearm   PROCEDURE:  Procedure(s): IRRIGATION AND DEBRIDEMENT EXTREMITY Tenodesis of the flexor digitorum superficialis of the right long, ring and little finger. Extensive debridement of necrotic muscle. Tissue rearrangement for local coverage of a large open wound 31 cm in length. Application L. xenograft for a wound 31 cm by 2 cm.  SURGEON:  Surgeon(s): Nadara Mustard, MD  ANESTHESIA:   general  EBL:  Minimal ML  SPECIMEN:  No Specimen  TOURNIQUET:  * No tourniquets in log *  PROCEDURE DETAILS: Patient is a 66 year old gentleman status post crush injury to left forearm. Patient initially underwent emergent fasciotomies compartment release and debridement of approximately 50% of the muscle from the left forearm. Patient was placed in a wound VAC and presents at this time for repeat irrigation debridement and wound closure. Risks and benefits of surgery were discussed including infection neurovascular injury nonhealing of the wound need for additional surgery. Patient states he understands and wished to proceed at this time. Description of procedure patient was brought to or room 3 and underwent a general anesthetic. After adequate levels of anesthesia were obtained the wound VAC was removed the wound was cleansed with Betadine paint and draped into a sterile field. The sutures were removed the hematoma was further decompressed. The wound was irrigated with pulsatile lavage. There was extensive necrosis of multiple muscles this was debrided back to what appeared to be healthy viable muscle. Only  black necrotic soft muscle was excised. Patient had complete disruption of the muscle belly of the flexor digitorum superficialis to the long, ring and little finger. Patient underwent  tenodesis of these tendons to provide for pull-through of the fingers. The wound was then further irrigated with pulsatile lavage. The Ace cell xenograft tissue graft was then placed over the wound local tissue rearrangement was prepared and the wound was closed over the Ace cell xenograft tissue graft. There is approximately a 1 cm of the wound open with the Ace cell intact beneath the open wound. The wound was then cleansed covered with Adaptic 4 x 4's ABDs Kerlix and Coban. Patient was extubated and taken to PACU in stable condition.  PLAN OF CARE: Admit to inpatient   PATIENT DISPOSITION:  PACU - hemodynamically stable.   Nadara Mustard, MD 07/16/2011 5:40 PM

## 2011-07-16 NOTE — Progress Notes (Signed)
Patient ID: Connor Ford, male   DOB: [birth year], 66 y.o.   MRN: [id number] Subjective:  No chest pain or sob. Arm pain controlled.  Objective:  Vital Signs in the last 24 hours: Temp:  [98.1 F (36.7 C)-99.2 F (37.3 C)] 98.3 F (36.8 C) (04/19 1137) Pulse Rate:  [51-153] 92  (04/19 1200) Resp:  [13-29] 18  (04/19 1200) BP: (93-170)/(37-85) 140/56 mmHg (04/19 1200) SpO2:  [94 %-100 %] 98 % (04/19 1200)  Intake/Output from previous day: 04/18 0701 - 04/19 0700 In: 4299.8 [P.O.:1150; I.V.:3099.8; IV Piggyback:50] Out: 3575 [Urine:3300; Drains:275] Intake/Output from this shift: Total I/O In: 650 [I.V.:650] Out: 850 [Urine:750; Drains:100]  Physical Exam: Well appearing NAD HEENT: Unremarkable Neck:  No JVD, no thyromegally Lungs:  Clear with no wheezes HEART:  IRegular rate rhythm, no murmurs, no rubs, no clicks Abd:  Flat, positive bowel sounds, no organomegally, no rebound, no guarding Ext:  2 plus pulses. Left arm bandaged with inflammation/swelling/ecchymosis present. Skin:  No rashes no nodules Neuro:  CN II through XII intact, motor grossly intact  Lab Results:  Basename 07/16/11 0335 07/15/11 0830  WBC 8.9 9.3  HGB 10.4* 11.3*  PLT 143* 157    Basename 07/16/11 0335 07/15/11 0830  NA 137 137  K 3.8 4.0  CL 102 101  CO2 29 29  GLUCOSE 108* 121*  BUN 8 11  CREATININE 0.79 0.81   No results found for this basename: TROPONINI:2,CK,MB:2 in the last 72 hours Hepatic Function Panel No results found for this basename: PROT,ALBUMIN,AST,ALT,ALKPHOS,BILITOT,BILIDIR,IBILI in the last 72 hours No results found for this basename: CHOL in the last 72 hours No results found for this basename: PROTIME in the last 72 hours  Imaging: No results found.  Cardiac Studies: Tele - Atrial fib with a controlled VR Assessment/Plan:  1. Atrial fib 2. IHSS 3. Left arm crush injury Rec: He appears to be doing well. Continue IV cardizem for now for HR control. Keep volume  replete. Hypovolemia will not be tolerated as well with his IHSS. Please call for questions.  LOS: 2 days    Lewayne Bunting 07/16/2011, 12:11 PM

## 2011-07-16 NOTE — Transfer of Care (Signed)
Immediate Anesthesia Transfer of Care Note  Patient: Connor Ford  Procedure(s) Performed: Procedure(s) (LRB): IRRIGATION AND DEBRIDEMENT EXTREMITY (Left)  Patient Location: PACU  Anesthesia Type: General  Level of Consciousness: awake and alert   Airway & Oxygen Therapy: Patient Spontanous Breathing and Patient connected to nasal cannula oxygen  Post-op Assessment: Report given to PACU RN and Post -op Vital signs reviewed and stable  Post vital signs: Reviewed and stable  Complications: No apparent anesthesia complications

## 2011-07-16 NOTE — Progress Notes (Signed)
No wound vacc in place upon arrival to pacu

## 2011-07-17 LAB — CBC
Hemoglobin: 8.9 g/dL — ABNORMAL LOW (ref 13.0–17.0)
MCHC: 34.5 g/dL (ref 30.0–36.0)
RDW: 14.2 % (ref 11.5–15.5)
WBC: 10.6 10*3/uL — ABNORMAL HIGH (ref 4.0–10.5)

## 2011-07-17 LAB — BASIC METABOLIC PANEL
BUN: 8 mg/dL (ref 6–23)
Creatinine, Ser: 0.72 mg/dL (ref 0.50–1.35)
GFR calc Af Amer: >90 mL/min (ref >90)
GFR calc non Af Amer: >90 mL/min (ref >90)
Potassium: 3.9 mEq/L (ref 3.5–5.1)

## 2011-07-17 LAB — PROTIME-INR
INR: 1.62 — ABNORMAL HIGH (ref 0.00–1.49)
Prothrombin Time: 19.5 seconds — ABNORMAL HIGH (ref 11.6–15.2)

## 2011-07-17 NOTE — Progress Notes (Signed)
Patient ID: Connor Ford, male   DOB: 11-03-1945, 66 y.o.   MRN: [id number] Comfortable this am.  Left arm has clean dressing.  Tolerated extensive surgery yesterday.  His left hand is well perfused and he does move his fingers.  Will continue elevation for now.

## 2011-07-17 NOTE — Anesthesia Postprocedure Evaluation (Signed)
Anesthesia Post Note  Patient: Connor Ford  Procedure(s) Performed: Procedure(s) (LRB): IRRIGATION AND DEBRIDEMENT EXTREMITY (Left)  Anesthesia type: General  Patient location: PACU  Post pain: Pain level controlled and Adequate analgesia  Post assessment: Post-op Vital signs reviewed, Patient's Cardiovascular Status Stable, Respiratory Function Stable, Patent Airway and Pain level controlled  Last Vitals:  Filed Vitals:   07/17/11 0700  BP: 151/48  Pulse: 61  Temp:   Resp: 21    Post vital signs: Reviewed and stable  Level of consciousness: awake, alert  and oriented  Complications: No apparent anesthesia complications

## 2011-07-18 LAB — TYPE AND SCREEN: Unit division: 0

## 2011-07-18 LAB — CBC
MCH: 32.2 pg (ref 26.0–34.0)
MCHC: 33.6 g/dL (ref 30.0–36.0)
Platelets: 159 10*3/uL (ref 150–400)
RDW: 14.6 % (ref 11.5–15.5)

## 2011-07-18 LAB — BASIC METABOLIC PANEL
BUN: 10 mg/dL (ref 6–23)
Calcium: 8.2 mg/dL — ABNORMAL LOW (ref 8.4–10.5)
GFR calc Af Amer: >90 mL/min (ref >90)
GFR calc non Af Amer: >90 mL/min (ref >90)
Glucose, Bld: 115 mg/dL — ABNORMAL HIGH (ref 70–99)
Potassium: 4 mEq/L (ref 3.5–5.1)

## 2011-07-18 LAB — PROTIME-INR: Prothrombin Time: 16.2 seconds — ABNORMAL HIGH (ref 11.6–15.2)

## 2011-07-18 NOTE — Progress Notes (Signed)
Patient ID: Connor Ford, male   DOB: 06/02/1945, 66 y.o.   MRN: [id number] No acute changes over the last 24 hours.  Comfortable this am. Resting.  Left arm with intact dressing.  Denies CP or SOB.  His rate has improved but still on cardizem drip.  Cardiology did not see yesterday.  Still showing occasional irreg rates on monitor.  Plan: Will leave in step-down today due to cardizem drip and await cardiology's input as to when he can go to regular telemetry floor.  He can get out of bed and get in a chair.

## 2011-07-19 ENCOUNTER — Encounter (HOSPITAL_COMMUNITY): Payer: Self-pay | Admitting: *Deleted

## 2011-07-19 LAB — CBC
HCT: 23.4 % — ABNORMAL LOW (ref 39.0–52.0)
MCHC: 33.3 g/dL (ref 30.0–36.0)
Platelets: 173 10*3/uL (ref 150–400)
RDW: 14.7 % (ref 11.5–15.5)
WBC: 8.3 10*3/uL (ref 4.0–10.5)

## 2011-07-19 LAB — BASIC METABOLIC PANEL
BUN: 12 mg/dL (ref 6–23)
Calcium: 8.3 mg/dL — ABNORMAL LOW (ref 8.4–10.5)
Creatinine, Ser: 0.74 mg/dL (ref 0.50–1.35)
GFR calc Af Amer: >90 mL/min (ref >90)
GFR calc non Af Amer: >90 mL/min (ref >90)

## 2011-07-19 LAB — PROTIME-INR
INR: 1.15 (ref 0.00–1.49)
Prothrombin Time: 14.9 seconds (ref 11.6–15.2)

## 2011-07-19 MED ORDER — DILTIAZEM HCL ER COATED BEADS 240 MG PO CP24: 240.0000 mg | ORAL_CAPSULE | Freq: Every day | ORAL | Status: DC

## 2011-07-19 MED ORDER — ASPIRIN 81 MG PO CHEW
81.0000 mg | CHEWABLE_TABLET | Freq: Every day | ORAL | Status: DC
  Administered 2011-07-19: 81 mg via ORAL
  Filled 2011-07-19: qty 1

## 2011-07-19 MED ORDER — POTASSIUM CHLORIDE CRYS ER 20 MEQ PO TBCR
40.0000 meq | EXTENDED_RELEASE_TABLET | Freq: Once | ORAL | Status: AC
  Administered 2011-07-19: 40 meq via ORAL
  Filled 2011-07-19: qty 2

## 2011-07-19 MED ORDER — ATORVASTATIN CALCIUM 10 MG PO TABS: 10.0000 mg | ORAL_TABLET | Freq: Every day | ORAL | Status: DC

## 2011-07-19 MED ORDER — DILTIAZEM HCL ER COATED BEADS 240 MG PO CP24
240.0000 mg | ORAL_CAPSULE | Freq: Every day | ORAL | Status: DC
  Administered 2011-07-19: 240 mg via ORAL
  Filled 2011-07-19: qty 1

## 2011-07-19 MED ORDER — ATORVASTATIN CALCIUM 10 MG PO TABS
10.0000 mg | ORAL_TABLET | Freq: Every day | ORAL | Status: DC
  Administered 2011-07-19: 10 mg via ORAL
  Filled 2011-07-19: qty 1

## 2011-07-19 MED ORDER — WARFARIN - PHYSICIAN DOSING INPATIENT: Freq: Every day | Status: DC

## 2011-07-19 MED ORDER — WARFARIN SODIUM 5 MG PO TABS
5.0000 mg | ORAL_TABLET | Freq: Every day | ORAL | Status: DC
  Administered 2011-07-19: 5 mg via ORAL
  Filled 2011-07-19: qty 1

## 2011-07-19 NOTE — Discharge Summary (Signed)
Physician Discharge Summary  Patient ID: Connor Ford MRN: [id number] DOB/AGE: Dec 15, 1945 66 y.o.  Admit date: 07/14/2011 Discharge date: 07/19/2011  Admission Diagnoses: Traumatic compartment syndrome left upper extremity  Discharge Diagnoses: Same Active Problems:  * No active hospital problems. *    Discharged Condition: stable  Hospital Course: Patient's hospital course was essentially unremarkable he presented with an acute compartment syndrome with over 50% of the muscles ischemic and dead and liquefied in the left upper extremity. He initially underwent decompression and debridement placement of a wound VAC and was brought back to the operating room for revision irrigation debridement of necrotic muscles and closure of the surgical incision patient's hospital course was complicated by a history of atrial fibrillation which was exacerbated during his hospital stay cardiology was consulted.  Consults: cardiology  Significant Diagnostic Studies: labs: Routine labs  Treatments: surgery: Please see operative notes  Discharge Exam: Blood pressure 108/45, pulse 73, temperature 98.6 F (37 C), temperature source Oral, resp. rate 18, height 6\' 4"  (1.93 m), weight 92.216 kg (203 lb 4.8 oz), SpO2 96.00%. Incision/Wound: clean and dry at time of discharge  Disposition:   Discharge Orders    Future Orders Please Complete By Expires   Diet - low sodium heart healthy      Call MD / Call 911      Comments:   If you experience chest pain or shortness of breath, CALL 911 and be transported to the hospital emergency room.  If you develope a fever above 101 F, pus (white drainage) or increased drainage or redness at the wound, or calf pain, call your surgeon's office.   Constipation Prevention      Comments:   Drink plenty of fluids.  Prune juice may be helpful.  You may use a stool softener, such as Colace (over the counter) 100 mg twice a day.  Use MiraLax (over the counter) for  constipation as needed.   Increase activity slowly as tolerated      Weight Bearing as taught in Physical Therapy      Comments:   Use a walker or crutches as instructed.     Medication List  As of 07/19/2011  7:06 AM   TAKE these medications         ANDROGEL PUMP 20.25 MG/ACT (1.62%) Gel   Generic drug: Testosterone   Place 40.5 mg onto the skin daily. Use 2 pumps      aspirin EC 81 MG tablet   Take 81 mg by mouth daily.      digoxin 0.125 MG tablet   Commonly known as: LANOXIN   Take 125 mcg by mouth daily.      diltiazem 180 MG 24 hr capsule   Commonly known as: DILACOR XR   Take 180 mg by mouth daily.      furosemide 40 MG tablet   Commonly known as: LASIX   Take 40 mg by mouth daily.      potassium chloride 10 MEQ tablet   Commonly known as: K-DUR,KLOR-CON   Take 10 mEq by mouth daily.      ramipril 5 MG capsule   Commonly known as: ALTACE   Take 5 mg by mouth daily.      simvastatin 40 MG tablet   Commonly known as: ZOCOR   Take 40 mg by mouth every evening.      warfarin 5 MG tablet   Commonly known as: COUMADIN   Take 5 mg by mouth every evening.  Follow-up Information    Follow up with Sydnie Sigmund V, MD in 2 weeks.   Contact information:   [address] [place] [address] [phone]          Signed: Nadara Mustard 07/19/2011, 7:06 AM

## 2011-07-19 NOTE — Progress Notes (Signed)
Nursing discharge note: Read through discharge instructions with pt at bedside, discussed wound care and dressings.  Earlier this morning discussed patient's labs and other factors that I was concerned with prior to discharge.  Dr. Elease Hashimoto and Dr. Lajoyce Corners both discussed patient's problems and plan is for discharge.  Patient vitals stable, rhythm continues to be afib rate of  70-110, blood pressure stable, afebrile.  Patient has no concerns at this time and will be escorted to the exit of the hospital and properly secured into vehicle.

## 2011-07-19 NOTE — Progress Notes (Signed)
Patient ID: Connor Ford, male   DOB: 1945-06-27, 66 y.o.   MRN: [id number] Discharge to home after cleared by cardiology.

## 2011-07-19 NOTE — Progress Notes (Signed)
Cardiology Progress Note Patient Name: Connor Ford Date of Encounter: 07/19/2011, 9:27 AM     Subjective  We were asked to come back and see Mr. Goar who is to be discharged today.  He is still on the dilt. Drip.  Hb has been treding down.  Pt feels fine.   Objective   Telemetry: Atrial Fibrillation 70s-80s, some bursts 110s-130s  Medications: . atorvastatin  20 mg Oral q1800  . digoxin  125 mcg Oral Daily  . furosemide  40 mg Oral Daily  . potassium chloride  10 mEq Oral Daily  . ramipril  5 mg Oral Daily   . sodium chloride 20 mL/hr at 07/18/11 1900  . diltiazem (CARDIZEM) infusion 10 mg/hr (07/19/11 0600)    Physical Exam: Temp:  [97.8 F (36.6 C)-98.6 F (37 C)] 98 F (36.7 C) (04/22 0733) Pulse Rate:  [67-113] 74  (04/22 0700) Resp:  [16-25] 22  (04/22 0700) BP: (97-147)/(35-69) 131/68 mmHg (04/22 0700) SpO2:  [88 %-99 %] 88 % (04/22 0700) Weight:  [203 lb 4.8 oz (92.216 kg)] 203 lb 4.8 oz (92.216 kg) (04/22 0500)  General: Pleasant elderly white male, in no acute distress. Head: Normocephalic, atraumatic, sclera non-icteric, nares are without discharge.  Neck: Supple. Negative for carotid bruits or JVD Lungs: Fine bibasilar rales. No wheezes or rhonchi. Breathing is unlabored. Heart: Irregularly irregular S1 S2 SEM. No rubs, or gallops.  Abdomen: Soft, non-tender, non-distended with normoactive bowel sounds. No rebound/guarding. No obvious abdominal masses. Msk:  Left arm with dry intact dressing, suspended. Strength and tone appear normal for age. Extremities: TED hose in place, 1+ BLE edema. No clubbing or cyanosis. Distal pedal pulses are intact and equal bilaterally. Neuro: Alert and oriented X 3. Moves all extremities spontaneously. Psych:  Responds to questions appropriately with a normal affect.   Intake/Output Summary (Last 24 hours) at 07/19/11 0927 Last data filed at 07/19/11 0700  Gross per 24 hour  Intake 1828.75 ml  Output   3025 ml    Net -1196.25 ml    Labs:  Basename 07/19/11 0400 07/18/11 0542  NA 132* 135  K 3.3* 4.0  CL 97 100  CO2 24 26  GLUCOSE 133* 115*  BUN 12 10  CREATININE 0.74 0.74  CALCIUM 8.3* 8.2*   Basename 07/19/11 0400 07/18/11 0542  WBC 8.3 11.0*  HGB 7.8* 8.3*  HCT 23.4* 24.7*  MCV 95.9 95.7  PLT 173 159   Radiology/Studies:   07/15/11 - 2D Echocardiogram Study Conclusions: - Left ventricle: Septal dyssynergy (IVCD) The cavity size was at the upper limits of normal. Wall thickness was increased in a pattern of moderate LVH. The estimated ejection fraction was 40%. Diffuse hypokinesis. - Aortic valve: Leaflets are calcified. Ther is moderate AS. There is moderate AI. Peak velocity: 309cm/s (S). Mean gradient: 18mm Hg (S). Peak gradient: 38mm Hg (S). - Mitral valve: The mitral leaflets are thickened. There is mitral stenosis that is probably moderate. - Left atrium: The atrium was moderately dilated. - Right ventricle: The cavity size was mildly dilated.Systolic function was mildly reduced. - Right atrium: The atrium was moderately dilated. - Pulmonary arteries: PA peak pressure: 32mm Hg (S). - Impressions: Many of the valvular findings were present 2009, but they are worse now. This echo is very complex. The AI interferes with the mitral inflow signal. This makes it difficult to fully assess the mitral inflow.     Assessment and Plan  66 y.o. male  w/ PMHx significant for permanent afib on Coumadin, CAD s/p CABG, MV repair, and IHSS s/p septal myomectomy who presented to [facility] on 07/14/11 with c/o arm pain after getting his left forearm caught between a pontoon boat and a truck and found to have compartment syndrome for which he underwent fasciotomy and then developed increased rates of chronic a.fib.  1. Chronic Atrial Fibrillation: Had elevated rates after crush injury to left forearm for which he was placed on Cardizem drip. Rates 70-80s, with some bursts in the 110-130s  with activity. Currently on 10mg /hr cardizem and oral digoxin. Was on Cardizem 180mg /day at home.  Will increase cardizem to 240mg /day and then dc cardizem drip. H&H has trended down and is now 7.8/23.4. See Dr. Harvie Bridge note regarding discussion with Dr. Lajoyce Corners and resumption of Coumadin.   2. CAD: s/p 3V CABG 2000; Pt reports last stress test Steffanie Dunn) was ~45yrs ago and was "normal". No anginal symptoms. EKG 4/18 showed NSIVCD (LBBB pattern) 108 BPM with infero/lateral T wave changes with slight ST depression/biphasic T's II, III, avF and ST depression/TWI V6, but we have no old EKG to compare. These findings were felt r/t intraventricular conduction delay and recs were made for him to f/u with his primary cardiologist at discharge. Resume 81mg  ASA .  3. 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 Would recommend f/u with primary cardiologist at discharge.  4. HLD: On Zocor 20mg /day. The max dose of Zocor when the patient is also on Diltiazem should be 10 mg daily. Decreased to 10mg /day with recs for outpatient follow up of lipids and LFTs   Signed, HOPE, JESSICA PA-C  Attending Note:   The patient was seen and examined.  Agree with assessment and plan as noted above.  I told patient that he was still on IV drips and that he may need to stay longer.  He insists on going home today.  His Hb has fallen since admission.    Discussed with Dr. Lajoyce Corners.  He feels sure that the blood loss was in the patient's arm.  He has no indication that he has had any GI bleeding.   He also feels certain that there is no further bleeding in the arm and that we can restart coumadin today.  Will resume home dose and allow him to drift up over the next several days.  Will also continue ASA 81.   His HR is a bit fast - will increase his cardizem  to 240.  Will also need to decrease his Zocor to 10 mg a day since that is the max dose when used with Diltiazem .  He should see his medical doctor later this week for H&H and INR.  Vesta Mixer, Montez Hageman., MD, [facility] 07/19/2011, 10:50 AM

## 2011-07-19 NOTE — Discharge Instructions (Signed)
Change dressing left upper extremity as needed.

## 2011-08-03 ENCOUNTER — Encounter (HOSPITAL_COMMUNITY): Payer: Self-pay | Admitting: Pharmacy Technician

## 2011-08-04 ENCOUNTER — Other Ambulatory Visit (HOSPITAL_COMMUNITY): Payer: Self-pay | Admitting: Orthopedic Surgery

## 2011-08-05 ENCOUNTER — Encounter (HOSPITAL_COMMUNITY): Payer: Self-pay

## 2011-08-05 ENCOUNTER — Encounter (HOSPITAL_COMMUNITY)
Admission: RE | Admit: 2011-08-05 | Discharge: 2011-08-05 | Disposition: A | Discharge status: Home / Self Care | Payer: Medicare PPO | Source: Ambulatory Visit | Attending: Orthopedic Surgery | Admitting: Orthopedic Surgery

## 2011-08-05 ENCOUNTER — Encounter (HOSPITAL_COMMUNITY)
Admission: RE | Admit: 2011-08-05 | Discharge: 2011-08-05 | Disposition: A | Discharge status: Home / Self Care | Payer: Medicare PPO | Source: Ambulatory Visit | Attending: Anesthesiology | Admitting: Anesthesiology

## 2011-08-05 LAB — PROTIME-INR
INR: 1.19 (ref 0.00–1.49)
Prothrombin Time: 15.4 seconds — ABNORMAL HIGH (ref 11.6–15.2)

## 2011-08-05 LAB — SURGICAL PCR SCREEN: Staphylococcus aureus: NEGATIVE

## 2011-08-05 LAB — COMPREHENSIVE METABOLIC PANEL
AST: 20 U/L (ref 0–37)
Albumin: 3.3 g/dL — ABNORMAL LOW (ref 3.5–5.2)
CO2: 26 mEq/L (ref 19–32)
Calcium: 8.9 mg/dL (ref 8.4–10.5)
Creatinine, Ser: 0.8 mg/dL (ref 0.50–1.35)
GFR calc non Af Amer: >90 mL/min (ref >90)

## 2011-08-05 LAB — CBC
MCH: 28.9 pg (ref 26.0–34.0)
MCV: 91.9 fL (ref 78.0–100.0)
Platelets: 373 10*3/uL (ref 150–400)
RDW: 15.8 % — ABNORMAL HIGH (ref 11.5–15.5)

## 2011-08-05 MED ORDER — CEFAZOLIN SODIUM-DEXTROSE 2-3 GM-% IV SOLR
2.0000 g | INTRAVENOUS | Status: AC
  Administered 2011-08-06: 2 g via INTRAVENOUS
  Filled 2011-08-05: qty 50

## 2011-08-05 NOTE — Pre-Procedure Instructions (Signed)
20 Connor Ford  08/05/2011   Your procedure is scheduled on:  May 10 @ 1100  Report to [facility] at 0900 AM.  Call this number if you have problems the morning of surgery: [phone]   Remember:   Do not eat food:After Midnight.  May have clear liquids: up to 4 Hours before arrival.  Clear liquids include soda, tea, black coffee, apple or grape juice, broth.  Take these medicines the morning of surgery with A SIP OF WATER: digioxin,diltiazem,asprin coumadin as directed by dr duda    Do not wear jewelry, make-up or nail polish.  Do not wear lotions, powders, or perfumes. You may wear deodorant.  Do not shave 48 hours prior to surgery.  Do not bring valuables to the hospital.  Contacts, dentures or bridgework may not be worn into surgery.  Leave suitcase in the car. After surgery it may be brought to your room.  For patients admitted to the hospital, checkout time is 11:00 AM the day of discharge.   Patients discharged the day of surgery will not be allowed to drive home.  Name and phone number of your driver: wife  Special Instructions: CHG Shower Use Special Wash: 1/2 bottle night before surgery and 1/2 bottle morning of surgery.   Please read over the following fact sheets that you were given: Pain Booklet, Coughing and Deep Breathing, Lab Information, MRSA Information and Surgical Site Infection Prevention

## 2011-08-05 NOTE — Consult Note (Signed)
Anesthesia Chart Review:  Patient is a 66 year old male scheduled for I&D/wound closure of left FA on 08/06/11.  He is s/p fasciotomy and extensive debridement for a left FA crush injury on 07/14/11 and I&D on 07/16/11.    History includes afib (in a left BBB pattern), former smoker, asbestos exposure, PVD, CAD/MR s/p CABG/MV Repair '00, history right CEA.  His Cardiologist is Dr. Dulce Sellar of [facility].  He was evaluated by [facility] Cardiologist Dr. Johney Frame during his hospitalization in April 2013.    EKG from 07/15/11 showed afib with RVR at 108 bpm, nonspecific IVC block, nonspecific T wave abnormality.  Echo from 07/15/11 showed: - Left ventricle: Septal dyssynergy (IVCD) The cavity size was at the upper limits of normal. Wall thickness was increased in a pattern of moderate LVH. The estimated ejection fraction was 40%. Diffuse hypokinesis. - Aortic valve: Leaflets are calcified. Ther is moderate AS. There is moderate AI. Peak velocity: 309cm/s (S). Mean gradient: 18mm Hg (S). Peak gradient: 38mm Hg (S). - Mitral valve: The mitral leaflets are thickened. There is mitral stenosis that is probably moderate. - Left atrium: The atrium was moderately dilated. - Right ventricle: The cavity size was mildly dilated. Systolic function was mildly reduced. - Right atrium: The atrium was moderately dilated. - Pulmonary arteries: PA peak pressure: 32mm Hg (S). - Impressions: Many of the valvular findings were present 2009, but they are worse now. This echo is very complex. The AI interferes with the mitral inflow signal. This makes it difficult to fully assess the mitral inflow.  CXR from 08/05/11 showed: "Enlargement of cardiac silhouette with pulmonary vascular  congestion post CABG. Emphysematous and bronchitic changes with chronic interstitial lung disease changes/fibrosis question related to asbestosis by history.  New area of peripheral density in the posterior chest on the  lateral view, question progressive pleural thickening or scarring but mass/tumor not excluded; recommend CT chest with contrast to further evaluate.  These results will be called to the ordering clinician or  representative by the Radiologist Assistant, and communication documented in the PACS Dashboard." Dr. Audrie Lia office is now closed, so I am unable to confirm if he has received these results yet.  I have asked the nursing staff to call this report to Dr. Lajoyce Corners on 08/06/11 (since his CXR was ordered per Anesthesia protocol).  Labs noted.  H/H 9.3/29.6 which is up from 07/19/11.  PT 15.4, but INR and PTT WNL.    Shonna Chock, PA-C

## 2011-08-06 ENCOUNTER — Encounter (HOSPITAL_COMMUNITY): Payer: Self-pay | Admitting: Vascular Surgery

## 2011-08-06 ENCOUNTER — Encounter (HOSPITAL_COMMUNITY)
Admission: RE | Disposition: A | Discharge status: Home / Self Care | Payer: Self-pay | Source: Ambulatory Visit | Attending: Orthopedic Surgery

## 2011-08-06 ENCOUNTER — Encounter (HOSPITAL_COMMUNITY): Payer: Self-pay | Admitting: *Deleted

## 2011-08-06 ENCOUNTER — Ambulatory Visit (HOSPITAL_COMMUNITY): Payer: Medicare PPO | Admitting: Vascular Surgery

## 2011-08-06 ENCOUNTER — Inpatient Hospital Stay (HOSPITAL_COMMUNITY)
Admission: RE | Admit: 2011-08-06 | Discharge: 2011-08-10 | DRG: 904 | Disposition: A | Discharge status: Home / Self Care | Payer: Medicare PPO | Source: Ambulatory Visit | Attending: Orthopedic Surgery | Admitting: Orthopedic Surgery

## 2011-08-06 DIAGNOSIS — E785 Hyperlipidemia, unspecified: Secondary | ICD-10-CM | POA: Diagnosis present

## 2011-08-06 DIAGNOSIS — IMO0002 Reserved for concepts with insufficient information to code with codable children: Secondary | ICD-10-CM | POA: Diagnosis present

## 2011-08-06 DIAGNOSIS — Y838 Other surgical procedures as the cause of abnormal reaction of the patient, or of later complication, without mention of misadventure at the time of the procedure: Secondary | ICD-10-CM | POA: Diagnosis present

## 2011-08-06 DIAGNOSIS — Z01812 Encounter for preprocedural laboratory examination: Secondary | ICD-10-CM

## 2011-08-06 DIAGNOSIS — M79A19 Nontraumatic compartment syndrome of unspecified upper extremity: Secondary | ICD-10-CM | POA: Diagnosis present

## 2011-08-06 DIAGNOSIS — Z77090 Contact with and (suspected) exposure to asbestos: Secondary | ICD-10-CM

## 2011-08-06 DIAGNOSIS — Z7982 Long term (current) use of aspirin: Secondary | ICD-10-CM

## 2011-08-06 DIAGNOSIS — Z87891 Personal history of nicotine dependence: Secondary | ICD-10-CM

## 2011-08-06 DIAGNOSIS — I4891 Unspecified atrial fibrillation: Secondary | ICD-10-CM | POA: Diagnosis present

## 2011-08-06 DIAGNOSIS — Z01810 Encounter for preprocedural cardiovascular examination: Secondary | ICD-10-CM

## 2011-08-06 DIAGNOSIS — I10 Essential (primary) hypertension: Secondary | ICD-10-CM | POA: Diagnosis present

## 2011-08-06 DIAGNOSIS — I739 Peripheral vascular disease, unspecified: Secondary | ICD-10-CM | POA: Diagnosis present

## 2011-08-06 DIAGNOSIS — I447 Left bundle-branch block, unspecified: Secondary | ICD-10-CM | POA: Diagnosis present

## 2011-08-06 DIAGNOSIS — Z7901 Long term (current) use of anticoagulants: Secondary | ICD-10-CM

## 2011-08-06 DIAGNOSIS — I2510 Atherosclerotic heart disease of native coronary artery without angina pectoris: Secondary | ICD-10-CM | POA: Diagnosis present

## 2011-08-06 DIAGNOSIS — Z79899 Other long term (current) drug therapy: Secondary | ICD-10-CM

## 2011-08-06 DIAGNOSIS — T8130XA Disruption of wound, unspecified, initial encounter: Principal | ICD-10-CM | POA: Diagnosis present

## 2011-08-06 HISTORY — PX: I & D EXTREMITY: SHX5045

## 2011-08-06 LAB — CBC
HCT: 18.7 % — ABNORMAL LOW (ref 39.0–52.0)
Hemoglobin: 6.3 g/dL — CL (ref 13.0–17.0)
MCH: 29.7 pg (ref 26.0–34.0)
MCHC: 33.7 g/dL (ref 30.0–36.0)
MCV: 88.2 fL (ref 78.0–100.0)
RBC: 2.12 MIL/uL — ABNORMAL LOW (ref 4.22–5.81)

## 2011-08-06 LAB — POCT I-STAT 4, (NA,K, GLUC, HGB,HCT)
Glucose, Bld: 99 mg/dL (ref 70–99)
HCT: 20 % — ABNORMAL LOW (ref 39.0–52.0)
Potassium: 4 mEq/L (ref 3.5–5.1)

## 2011-08-06 LAB — PREPARE RBC (CROSSMATCH)

## 2011-08-06 SURGERY — IRRIGATION AND DEBRIDEMENT EXTREMITY
Anesthesia: General | Site: Arm Lower | Laterality: Left | Wound class: Dirty or Infected

## 2011-08-06 MED ORDER — LACTATED RINGERS IV SOLN
INTRAVENOUS | Status: DC | PRN
  Administered 2011-08-06: 11:00:00 via INTRAVENOUS

## 2011-08-06 MED ORDER — MIDAZOLAM HCL 5 MG/5ML IJ SOLN
INTRAMUSCULAR | Status: DC | PRN
  Administered 2011-08-06: 2 mg via INTRAVENOUS

## 2011-08-06 MED ORDER — METOCLOPRAMIDE HCL 5 MG PO TABS
5.0000 mg | ORAL_TABLET | Freq: Three times a day (TID) | ORAL | Status: DC | PRN
  Filled 2011-08-06: qty 2

## 2011-08-06 MED ORDER — METOCLOPRAMIDE HCL 5 MG/ML IJ SOLN
5.0000 mg | Freq: Three times a day (TID) | INTRAMUSCULAR | Status: DC | PRN
  Filled 2011-08-06: qty 2

## 2011-08-06 MED ORDER — HYDROCODONE-ACETAMINOPHEN 5-325 MG PO TABS
1.0000 | ORAL_TABLET | ORAL | Status: DC | PRN
  Administered 2011-08-07: 1 via ORAL
  Filled 2011-08-06: qty 1

## 2011-08-06 MED ORDER — DILTIAZEM HCL ER COATED BEADS 240 MG PO CP24
240.0000 mg | ORAL_CAPSULE | Freq: Every day | ORAL | Status: DC
  Administered 2011-08-07 – 2011-08-10 (×4): 240 mg via ORAL
  Filled 2011-08-06 (×4): qty 1

## 2011-08-06 MED ORDER — CEFAZOLIN SODIUM-DEXTROSE 2-3 GM-% IV SOLR
2.0000 g | Freq: Four times a day (QID) | INTRAVENOUS | Status: AC
  Administered 2011-08-06: 2 g via INTRAVENOUS
  Administered 2011-08-06: 1 g via INTRAVENOUS
  Administered 2011-08-07: 2 g via INTRAVENOUS
  Filled 2011-08-06 (×3): qty 50

## 2011-08-06 MED ORDER — SODIUM CHLORIDE 0.9 % IV SOLN
INTRAVENOUS | Status: DC
  Administered 2011-08-06: 20 mL via INTRAVENOUS

## 2011-08-06 MED ORDER — BUPIVACAINE-EPINEPHRINE PF 0.5-1:200000 % IJ SOLN
INTRAMUSCULAR | Status: DC | PRN
  Administered 2011-08-06: 30 mL

## 2011-08-06 MED ORDER — MORPHINE SULFATE 4 MG/ML IJ SOLN: 0.0500 mg/kg | INTRAMUSCULAR | Status: DC | PRN

## 2011-08-06 MED ORDER — POTASSIUM CHLORIDE CRYS ER 10 MEQ PO TBCR
10.0000 meq | EXTENDED_RELEASE_TABLET | Freq: Every day | ORAL | Status: DC
  Administered 2011-08-06 – 2011-08-10 (×5): 10 meq via ORAL
  Filled 2011-08-06 (×5): qty 1

## 2011-08-06 MED ORDER — CEFAZOLIN SODIUM 1-5 GM-% IV SOLN
INTRAVENOUS | Status: AC
  Filled 2011-08-06: qty 50

## 2011-08-06 MED ORDER — ONDANSETRON HCL 4 MG PO TABS: 4.0000 mg | ORAL_TABLET | Freq: Four times a day (QID) | ORAL | Status: DC | PRN

## 2011-08-06 MED ORDER — OXYCODONE-ACETAMINOPHEN 5-325 MG PO TABS
1.0000 | ORAL_TABLET | ORAL | Status: DC | PRN
  Administered 2011-08-06 – 2011-08-08 (×10): 1 via ORAL
  Administered 2011-08-09: 2 via ORAL
  Administered 2011-08-09 – 2011-08-10 (×4): 1 via ORAL
  Filled 2011-08-06 (×7): qty 1
  Filled 2011-08-06: qty 2
  Filled 2011-08-06 (×2): qty 1
  Filled 2011-08-06: qty 2
  Filled 2011-08-06 (×4): qty 1

## 2011-08-06 MED ORDER — ONDANSETRON HCL 4 MG/2ML IJ SOLN
4.0000 mg | Freq: Four times a day (QID) | INTRAMUSCULAR | Status: DC | PRN
  Filled 2011-08-06: qty 2

## 2011-08-06 MED ORDER — RAMIPRIL 5 MG PO CAPS
5.0000 mg | ORAL_CAPSULE | Freq: Every day | ORAL | Status: DC
  Administered 2011-08-06 – 2011-08-10 (×5): 5 mg via ORAL
  Filled 2011-08-06 (×5): qty 1

## 2011-08-06 MED ORDER — ONDANSETRON HCL 4 MG/2ML IJ SOLN
INTRAMUSCULAR | Status: DC | PRN
  Administered 2011-08-06: 4 mg via INTRAVENOUS

## 2011-08-06 MED ORDER — DIGOXIN 125 MCG PO TABS
125.0000 ug | ORAL_TABLET | Freq: Every day | ORAL | Status: DC
  Administered 2011-08-07 – 2011-08-10 (×4): 125 ug via ORAL
  Filled 2011-08-06 (×4): qty 1

## 2011-08-06 MED ORDER — ONDANSETRON HCL 4 MG/2ML IJ SOLN: 4.0000 mg | Freq: Once | INTRAMUSCULAR | Status: DC | PRN

## 2011-08-06 MED ORDER — FUROSEMIDE 40 MG PO TABS
40.0000 mg | ORAL_TABLET | Freq: Every day | ORAL | Status: DC
  Administered 2011-08-06 – 2011-08-10 (×5): 40 mg via ORAL
  Filled 2011-08-06 (×5): qty 1

## 2011-08-06 MED ORDER — HYDROMORPHONE HCL PF 1 MG/ML IJ SOLN: 0.2500 mg | INTRAMUSCULAR | Status: DC | PRN

## 2011-08-06 MED ORDER — SODIUM CHLORIDE 0.9 % IR SOLN
Status: DC | PRN
  Administered 2011-08-06: 3000 mL

## 2011-08-06 MED ORDER — FENTANYL CITRATE 0.05 MG/ML IJ SOLN
INTRAMUSCULAR | Status: DC | PRN
  Administered 2011-08-06: 100 ug via INTRAVENOUS

## 2011-08-06 MED ORDER — LIDOCAINE HCL (CARDIAC) 20 MG/ML IV SOLN
INTRAVENOUS | Status: DC | PRN
  Administered 2011-08-06: 100 mg via INTRAVENOUS

## 2011-08-06 MED ORDER — PROPOFOL 10 MG/ML IV EMUL
INTRAVENOUS | Status: DC | PRN
  Administered 2011-08-06: 120 mg via INTRAVENOUS

## 2011-08-06 MED ORDER — SODIUM CHLORIDE 0.9 % IJ SOLN
INTRAMUSCULAR | Status: AC
  Filled 2011-08-06: qty 20

## 2011-08-06 MED ORDER — HYDROMORPHONE HCL PF 1 MG/ML IJ SOLN
0.5000 mg | INTRAMUSCULAR | Status: DC | PRN
  Administered 2011-08-09: 1 mg via INTRAVENOUS
  Filled 2011-08-06 (×2): qty 1

## 2011-08-06 SURGICAL SUPPLY — 47 items
BLADE SURG 10 STRL SS (BLADE) ×2 IMPLANT
BNDG COHESIVE 4X5 TAN STRL (GAUZE/BANDAGES/DRESSINGS) ×2 IMPLANT
BNDG COHESIVE 6X5 TAN STRL LF (GAUZE/BANDAGES/DRESSINGS) ×4 IMPLANT
BNDG GAUZE STRTCH 6 (GAUZE/BANDAGES/DRESSINGS) ×6 IMPLANT
CLOTH BEACON ORANGE TIMEOUT ST (SAFETY) ×2 IMPLANT
COTTON STERILE ROLL (GAUZE/BANDAGES/DRESSINGS) ×2 IMPLANT
COVER SURGICAL LIGHT HANDLE (MISCELLANEOUS) ×2 IMPLANT
CUFF TOURNIQUET SINGLE 18IN (TOURNIQUET CUFF) ×2 IMPLANT
CUFF TOURNIQUET SINGLE 24IN (TOURNIQUET CUFF) IMPLANT
CUFF TOURNIQUET SINGLE 34IN LL (TOURNIQUET CUFF) IMPLANT
CUFF TOURNIQUET SINGLE 44IN (TOURNIQUET CUFF) IMPLANT
DRAPE U-SHAPE 47X51 STRL (DRAPES) ×2 IMPLANT
DRSG ADAPTIC 3X8 NADH LF (GAUZE/BANDAGES/DRESSINGS) ×2 IMPLANT
DRSG MEPITEL 3X4 ME34 (GAUZE/BANDAGES/DRESSINGS) ×1 IMPLANT
DRSG VAC ATS MED SENSATRAC (GAUZE/BANDAGES/DRESSINGS) ×1 IMPLANT
DURAPREP 26ML APPLICATOR (WOUND CARE) ×2 IMPLANT
ELECT CAUTERY BLADE 6.4 (BLADE) IMPLANT
ELECT REM PT RETURN 9FT ADLT (ELECTROSURGICAL)
ELECTRODE REM PT RTRN 9FT ADLT (ELECTROSURGICAL) IMPLANT
GLOVE BIOGEL PI IND STRL 9 (GLOVE) ×1 IMPLANT
GLOVE BIOGEL PI INDICATOR 9 (GLOVE) ×1
GLOVE SURG ORTHO 9.0 STRL STRW (GLOVE) ×2 IMPLANT
GOWN PREVENTION PLUS XLARGE (GOWN DISPOSABLE) ×2 IMPLANT
GOWN SRG XL XLNG 56XLVL 4 (GOWN DISPOSABLE) ×1 IMPLANT
GOWN STRL NON-REIN XL XLG LVL4 (GOWN DISPOSABLE) ×4
HANDPIECE INTERPULSE COAX TIP (DISPOSABLE)
KIT BASIN OR (CUSTOM PROCEDURE TRAY) ×2 IMPLANT
KIT ROOM TURNOVER OR (KITS) ×2 IMPLANT
MANIFOLD NEPTUNE II (INSTRUMENTS) ×2 IMPLANT
MATRIX SURGICAL PSM 10X15CM (Tissue) ×1 IMPLANT
MICROMATRIX 500MG (Tissue) ×2 IMPLANT
NS IRRIG 1000ML POUR BTL (IV SOLUTION) ×2 IMPLANT
PACK ORTHO EXTREMITY (CUSTOM PROCEDURE TRAY) ×2 IMPLANT
PAD ARMBOARD 7.5X6 YLW CONV (MISCELLANEOUS) ×4 IMPLANT
PADDING CAST COTTON 6X4 STRL (CAST SUPPLIES) ×2 IMPLANT
SET HNDPC FAN SPRY TIP SCT (DISPOSABLE) IMPLANT
SOLUTION PARTIC MCRMTRX 500MG (Tissue) IMPLANT
SPONGE GAUZE 4X4 12PLY (GAUZE/BANDAGES/DRESSINGS) ×2 IMPLANT
SPONGE LAP 18X18 X RAY DECT (DISPOSABLE) ×2 IMPLANT
STOCKINETTE IMPERVIOUS 9X36 MD (GAUZE/BANDAGES/DRESSINGS) ×2 IMPLANT
TOWEL OR 17X24 6PK STRL BLUE (TOWEL DISPOSABLE) ×2 IMPLANT
TOWEL OR 17X26 10 PK STRL BLUE (TOWEL DISPOSABLE) ×2 IMPLANT
TUBE ANAEROBIC SPECIMEN COL (MISCELLANEOUS) IMPLANT
TUBE CONNECTING 12X1/4 (SUCTIONS) ×2 IMPLANT
UNDERPAD 30X30 INCONTINENT (UNDERPADS AND DIAPERS) ×2 IMPLANT
WATER STERILE IRR 1000ML POUR (IV SOLUTION) ×2 IMPLANT
YANKAUER SUCT BULB TIP NO VENT (SUCTIONS) ×2 IMPLANT

## 2011-08-06 NOTE — H&P (Signed)
Connor Ford is an 66 y.o. male.   Chief Complaint: dehiscence of fasciotomies left forearm HPI: developed necrotic wound breakdown after fasciotomy left forearm  Past Medical History  Diagnosis Date  . Permanent atrial fibrillation     With LBBB pattern. On Chronic Coumadin  . 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 Last echo 03/2007 with mild AS, mild-mod AR, mild-mod MR, mild MS   . Asbestos exposure   . Peripheral vascular disease     a) reported hx of infrainguinal arterial occlusive disease. b) Bilateral CEA  in 2001 with RCEA operation complicated by neck hematoma and l forearm hematoma/compartment syndrome s/p fasciotomy  . Hyperlipidemia   . CAD (coronary artery disease)      followed by dr Dulce Sellar.last seen 03/2011  . HTN (hypertension)     followed by dr Wynonia Sours    Past Surgical History  Procedure Date  . Carotid endarterectomy     R&L  . Fasciotomy     for compartment syndrome of L forearm following RCEA  . Coronary artery bypass graft   . Mitral valve repair   . Fasciotomy 07/14/2011    Procedure: FASCIOTOMY;  Surgeon: Nadara Mustard, MD;  Location: [facility] OR;  Service: Orthopedics;  Laterality: Left;  . I&d extremity 07/16/2011    Procedure: IRRIGATION AND DEBRIDEMENT EXTREMITY;  Surgeon: Nadara Mustard, MD;  Location: [facility] OR;  Service: Orthopedics;  Laterality: Left;  Irrigation and Debridement Left Forearm, apply Acell and Wound VAC    Family History  Problem Relation Age of Onset  . Stroke Mother     Died at 56 of a stroke  . Lung disease Brother     Brother died of black lung disease   Social History:  reports that he quit smoking about 12 years ago. His smoking use included Cigarettes. He has a 60 pack-year smoking history. He quit smokeless tobacco use about 12 years ago. His smokeless tobacco use  included Snuff. He reports that he drinks about 8.4 ounces of alcohol per week. He reports that he does not use illicit drugs.  Allergies: No Known Allergies  No prescriptions prior to admission    Results for orders placed during the hospital encounter of 08/05/11 (from the past 48 hour(s))  SURGICAL PCR SCREEN     Status: Normal   Collection Time   08/05/11  9:41 AM      Component Value Range Comment   MRSA, PCR NEGATIVE  NEGATIVE     Staphylococcus aureus NEGATIVE  NEGATIVE    APTT     Status: Normal   Collection Time   08/05/11 10:02 AM      Component Value Range Comment   aPTT 34  24 - 37 (seconds)   CBC     Status: Abnormal   Collection Time   08/05/11 10:02 AM      Component Value Range Comment   WBC 7.2  4.0 - 10.5 (K/uL)    RBC 3.22 (*) 4.22 - 5.81 (MIL/uL)    Hemoglobin 9.3 (*) 13.0 - 17.0 (g/dL)    HCT 11.9 (*) 14.7 - 52.0 (%)    MCV 91.9  78.0 - 100.0 (fL)    MCH 28.9  26.0 - 34.0 (pg)    MCHC 31.4  30.0 - 36.0 (g/dL)    RDW 82.9 (*) 56.2 - 15.5 (%)  Platelets 373  150 - 400 (K/uL)   COMPREHENSIVE METABOLIC PANEL     Status: Abnormal   Collection Time   08/05/11 10:02 AM      Component Value Range Comment   Sodium 137  135 - 145 (mEq/L)    Potassium 4.6  3.5 - 5.1 (mEq/L)    Chloride 101  96 - 112 (mEq/L)    CO2 26  19 - 32 (mEq/L)    Glucose, Bld 97  70 - 99 (mg/dL)    BUN 15  6 - 23 (mg/dL)    Creatinine, Ser 4.78  0.50 - 1.35 (mg/dL)    Calcium 8.9  8.4 - 10.5 (mg/dL)    Total Protein 7.0  6.0 - 8.3 (g/dL)    Albumin 3.3 (*) 3.5 - 5.2 (g/dL)    AST 20  0 - 37 (U/L)    ALT 15  0 - 53 (U/L)    Alkaline Phosphatase 109  39 - 117 (U/L)    Total Bilirubin 0.5  0.3 - 1.2 (mg/dL)    GFR calc non Af Amer >90  >90 (mL/min)    GFR calc Af Amer >90  >90 (mL/min)   PROTIME-INR     Status: Abnormal   Collection Time   08/05/11 10:02 AM      Component Value Range Comment   Prothrombin Time 15.4 (*) 11.6 - 15.2 (seconds)    INR 1.19  0.00 - 1.49     Dg Chest 2  View  08/05/2011  *RADIOLOGY REPORT*  Clinical Data: Preoperative assessment, hypertension, asbestosis, coronary artery disease, peripheral vascular disease  CHEST - 2 VIEW  Comparison: 01/06/2011  Findings: Enlargement of cardiac silhouette post CABG. Calcified tortuous aorta. Slight pulmonary vascular congestion. Chronic interstitial lung disease changes, stable. Scattered areas of peripheral scarring and minimal pleural thickening. Calcified granuloma right upper lobe. No definite acute infiltrate, pleural effusion or pneumothorax. New area of peripheral density identified posteriorly in the chest on lateral view, of uncertain etiology. This could represent progressive scarring or pleural thickening but requires further assessment to determine significance and to exclude tumor. Bones appear demineralized.  IMPRESSION: Enlargement of cardiac silhouette with pulmonary vascular congestion post CABG. Emphysematous and bronchitic changes with chronic interstitial lung disease changes/fibrosis question related to asbestosis by history. New area of peripheral density in the posterior chest on the lateral view, question progressive pleural thickening or scarring but mass/tumor not excluded; recommend CT chest with contrast to further evaluate.  These results will be called to the ordering clinician or representative by the Radiologist Assistant, and communication documented in the PACS Dashboard.  Original Report Authenticated By: Lollie Marrow, M.D.    Review of Systems  All other systems reviewed and are negative.    There were no vitals taken for this visit. Physical Exam  Necrotic wound breakdown Assessment/Plan Necrotic wound dehiscence left forearm Plan for irrigation and debridement and wound closure.  Vondell Sowell V 08/06/2011, 6:00 AM

## 2011-08-06 NOTE — Transfer of Care (Signed)
Immediate Anesthesia Transfer of Care Note  Patient: Connor Ford  Procedure(s) Performed: Procedure(s) (LRB): IRRIGATION AND DEBRIDEMENT EXTREMITY (Left)  Patient Location: PACU  Anesthesia Type: General and Regional  Level of Consciousness: responds to stimulation  Airway & Oxygen Therapy: Patient Spontanous Breathing and Patient connected to face mask oxygen  Post-op Assessment: Report given to PACU RN and Post -op Vital signs reviewed and stable  Post vital signs: Reviewed and stable  Complications: No apparent anesthesia complications

## 2011-08-06 NOTE — Anesthesia Procedure Notes (Addendum)
Anesthesia Regional Block:  Supraclavicular block  Pre-Anesthetic Checklist: ,, timeout performed, Correct Patient, Correct Site, Correct Laterality, Correct Procedure, Correct Position, site marked, Risks and benefits discussed,  Surgical consent,  Pre-op evaluation,  At surgeon's request and post-op pain management  Laterality: Left  Prep: chloraprep       Needles:  Injection technique: Single-shot  Needle Type: Echogenic Stimulator Needle     Needle Length: 5cm 5 cm     Additional Needles:  Procedures: ultrasound guided and nerve stimulator Supraclavicular block  Nerve Stimulator or Paresthesia:  Response: 0.4 mA,   Additional Responses:   Narrative:  Start time: 08/06/2011 10:40 AM End time: 08/06/2011 11:00 AM Injection made incrementally with aspirations every 5 mL.  Performed by: Personally  Anesthesiologist: Arta Bruce MD  Additional Notes: Monitors applied. Patient sedated. Sterile prep and drape,hand hygiene and sterile gloves were used. Relevant anatomy identified.Needle position confirmed.Local anesthetic injected incrementally after negative aspiration. Local anesthetic spread visualized around nerve(s). Vascular puncture avoided. No complications. Image printed for medical record.The patient tolerated the procedure well.       Supraclavicular block Procedure Name: LMA Insertion Date/Time: 08/06/2011 11:14 AM Performed by: Arlice Colt B Pre-anesthesia Checklist: Patient identified, Emergency Drugs available, Suction available, Patient being monitored and Timeout performed Patient Re-evaluated:Patient Re-evaluated prior to inductionOxygen Delivery Method: Circle system utilized Preoxygenation: Pre-oxygenation with 100% oxygen Intubation Type: IV induction LMA: LMA inserted LMA Size: 5.0 Number of attempts: 1 Placement Confirmation: positive ETCO2 and breath sounds checked- equal and bilateral Tube secured with: Tape Dental Injury: Teeth and  Oropharynx as per pre-operative assessment

## 2011-08-06 NOTE — Op Note (Signed)
OPERATIVE REPORT  DATE OF SURGERY: 08/06/2011  PATIENT:  Sharyon Medicus,  66 y.o. male  PRE-OPERATIVE DIAGNOSIS:  Left Forearm Dehiscence  POST-OPERATIVE DIAGNOSIS:  Left Forearm Dehiscence  PROCEDURE:  Procedure(s): IRRIGATION AND DEBRIDEMENT EXTREMITY Application of a cell xenograft tissue graft 6 x 10" sheet.  Carpal tunnel release revision. Application of wound VAC.  SURGEON:  Surgeon(s): Nadara Mustard, MD  ANESTHESIA:   regional and general  EBL:  Minimal ML  SPECIMEN:  No Specimen  TOURNIQUET:  * No tourniquets in log *  PROCEDURE DETAILS: Patient is a 66 year old gentleman who is status post crush injury to the left arm. He is status post 2 previous surgeries and presents at this time with dehiscence of the wound increasing symptoms of median nerve neuropathy and presents for revision carpal tunnel release debridement of the forearm application of wound VAC and application of a cell xenograft. Risks and benefits were discussed patient states he understands was to proceed at this time. Patient is at risk of loss of limb. Patient was brought to or room 15 and underwent a general anesthetic after a supraclavicular block. After adequate levels of anesthesia were obtained patient's left upper extremity was prepped using DuraPrep over the nontraumatic wound prepped using Betadine paint over the traumatic wound and draped into a sterile field. The sutures were removed necrotic muscle necrotic skin and necrotic fascia was debrided. A revision carpal tunnel release was performed. The wound was irrigated with pulsatile lavage. The wound was closed with approximately 2-3 cm gap. The wound was closed using 2-0 nylon. The wound was closed over the Ace cell xenograft tissue graft. A wound VAC was applied set to -95 mm of suction. Patient was extubated taken to the PACU in stable condition.  PLAN OF CARE: Admit to inpatient   PATIENT DISPOSITION:  PACU - hemodynamically stable.     Nadara Mustard, MD 08/06/2011 11:48 AM

## 2011-08-06 NOTE — Anesthesia Postprocedure Evaluation (Signed)
Anesthesia Post Note  Patient: Connor Ford  Procedure(s) Performed: Procedure(s) (LRB): IRRIGATION AND DEBRIDEMENT EXTREMITY (Left)  Anesthesia type: general  Patient location: PACU  Post pain: Pain level controlled  Post assessment: Patient's Cardiovascular Status Stable  Last Vitals:  Filed Vitals:   08/06/11 0900  BP: 143/55  Pulse: 85  Temp: 36.2 C  Resp: 16    Post vital signs: Reviewed and stable  Level of consciousness: sedated  Complications: No apparent anesthesia complications

## 2011-08-06 NOTE — Progress Notes (Signed)
Pt. Severely oozing from Left arm.  VAC dsg redressed to obtain seal.  Dr. Michelle Piper and Dr. Lajoyce Corners at bedside to discuss management.  Multiple blood products ordered (PRBC, FFP, and platelets).  Supporting patient with additional IVF and oxygen during manual pressure and VAC dsg redressing.  Pt. Alert and cooperative during procedure.  Wife at bedside.  Suction per VAC increased to to obtain a suction of 75 mmHg.  Dr. Lajoyce Corners aware.

## 2011-08-06 NOTE — Anesthesia Preprocedure Evaluation (Addendum)
Anesthesia Evaluation  Patient identified by MRN, date of birth, ID band Patient awake    Reviewed: Allergy & Precautions, H&P , NPO status , Patient's Chart, lab work & pertinent test results, reviewed documented beta blocker date and time   History of Anesthesia Complications Negative for: history of anesthetic complications  Airway Mallampati: II TM Distance: >3 FB Neck ROM: Full    Dental  (+) Teeth Intact   Pulmonary          Cardiovascular hypertension, Pt. on medications + CAD and + CABG + dysrhythmias Atrial Fibrillation Rhythm:Irregular     Neuro/Psych    GI/Hepatic   Endo/Other    Renal/GU      Musculoskeletal   Abdominal   Peds  Hematology   Anesthesia Other Findings   Reproductive/Obstetrics                          Anesthesia Physical Anesthesia Plan  ASA: III  Anesthesia Plan: General   Post-op Pain Management: MAC Combined w/ Regional for Post-op pain   Induction: Intravenous  Airway Management Planned: LMA  Additional Equipment:   Intra-op Plan:   Post-operative Plan: Extubation in OR  Informed Consent: I have reviewed the patients History and Physical, chart, labs and discussed the procedure including the risks, benefits and alternatives for the proposed anesthesia with the patient or authorized representative who has indicated his/her understanding and acceptance.   Dental advisory given  Plan Discussed with: Surgeon and CRNA  Anesthesia Plan Comments: (INR 1.19. OK for Supraclavicular block. Arta Bruce MD)       Anesthesia Quick Evaluation

## 2011-08-06 NOTE — Progress Notes (Signed)
Dr. Michelle Piper notified of patient drinking two cups of black coffee at 6am this morning. No new orders. Connor Ford

## 2011-08-07 ENCOUNTER — Encounter (HOSPITAL_COMMUNITY): Payer: Self-pay | Admitting: Radiology

## 2011-08-07 ENCOUNTER — Inpatient Hospital Stay (HOSPITAL_COMMUNITY): Payer: Medicare PPO

## 2011-08-07 LAB — CBC
HCT: 23.1 % — ABNORMAL LOW (ref 39.0–52.0)
MCH: 29.2 pg (ref 26.0–34.0)
MCHC: 33.8 g/dL (ref 30.0–36.0)
MCV: 86.5 fL (ref 78.0–100.0)
Platelets: 227 10*3/uL (ref 150–400)
RDW: 15.6 % — ABNORMAL HIGH (ref 11.5–15.5)

## 2011-08-07 LAB — PREPARE FRESH FROZEN PLASMA: Unit division: 0

## 2011-08-07 LAB — PREPARE PLATELET PHERESIS: Unit division: 0

## 2011-08-07 MED ORDER — FERROUS SULFATE 325 (65 FE) MG PO TABS
325.0000 mg | ORAL_TABLET | Freq: Two times a day (BID) | ORAL | Status: DC
  Administered 2011-08-07 – 2011-08-10 (×6): 325 mg via ORAL
  Filled 2011-08-07 (×8): qty 1

## 2011-08-07 MED ORDER — IOHEXOL 300 MG/ML  SOLN
80.0000 mL | Freq: Once | INTRAMUSCULAR | Status: AC | PRN
  Administered 2011-08-07: 80 mL via INTRAVENOUS

## 2011-08-07 NOTE — Progress Notes (Signed)
Subjective: 1 Day Post-Op Procedure(s) (LRB): IRRIGATION AND DEBRIDEMENT EXTREMITY (Left) Sitting upright in bed left arm elevated above heart. Pain Levels are mild. Patient reports pain as mild.    Objective:   VITALS:  Temp:  [97 F (36.1 C)-99.4 F (37.4 C)] 98 F (36.7 C) (05/11 0800) Pulse Rate:  [54-130] 85  (05/11 1200) Resp:  [13-23] 20  (05/11 1200) BP: (103-137)/(31-58) 115/56 mmHg (05/11 1200) SpO2:  [97 %-100 %] 97 % (05/11 1200) Arterial Line BP: (111-164)/(31-43) 129/41 mmHg (05/11 0800) Weight:  [86.1 kg (189 lb 13.1 oz)] 86.1 kg (189 lb 13.1 oz) (05/10 1834)  Transfused total of 4 units of blood last 24 hours.  Hgb is more stable. A-line discontinued.  Neurologically intact ABD soft Neurovascular intact left circumferential Vac of forearm. left fingers are warm  and pink, VAC in place and functioning.   LABS  Basename 08/07/11 0600 08/06/11 2000 08/06/11 1245 08/05/11 1002  HGB 7.8* 6.3* 6.8* 9.3*  WBC 10.2 8.7 -- --  PLT 227 227 -- --    Basename 08/06/11 1245 08/05/11 1002  NA 141 137  K 4.0 4.6  CL -- 101  CO2 -- 26  BUN -- 15  CREATININE -- 0.80  GLUCOSE 99 97    Basename 08/05/11 1002  LABPT --  INR 1.19     Assessment/Plan: 1 Day Post-Op Procedure(s) (LRB): IRRIGATION AND DEBRIDEMENT EXTREMITY (Left)  Advance diet Up with therapy Scheduled for a Chest CT scan by Dr. Lajoyce Corners. Enhanced is okay with creatinine 0.82 Russ Looper E 08/07/2011, 3:14 PM

## 2011-08-08 LAB — TYPE AND SCREEN
Unit division: 0
Unit division: 0

## 2011-08-08 LAB — CBC
MCH: 29.2 pg (ref 26.0–34.0)
MCHC: 32.9 g/dL (ref 30.0–36.0)
RDW: 15.7 % — ABNORMAL HIGH (ref 11.5–15.5)

## 2011-08-08 NOTE — Progress Notes (Addendum)
Subjective: 2 Days Post-Op Procedure(s) (LRB): IRRIGATION AND DEBRIDEMENT EXTREMITY (Left) Awake alert and Oriented x 4 Patient reports pain as mild.    Objective:   VITALS:  Temp:  [98 F (36.7 C)-98.7 F (37.1 C)] 98.1 F (36.7 C) (05/12 0800) Pulse Rate:  [67-85] 73  (05/12 0800) Resp:  [17-21] 18  (05/12 0800) BP: (103-127)/(40-58) 104/45 mmHg (05/12 0800) SpO2:  [95 %-98 %] 95 % (05/12 0800)  ABD soft Intact pulses distally Incision: dressing C/D/I No cellulitis present Compartment soft   LABS  Basename 08/07/11 0600 08/06/11 2000 08/06/11 1245  HGB 7.8* 6.3* 6.8*  WBC 10.2 8.7 --  PLT 227 227 --    Basename 08/06/11 1245  NA 141  K 4.0  CL --  CO2 --  BUN --  CREATININE --  GLUCOSE 99   No results found for this basename: LABPT:2,INR:2 in the last 72 hours   Assessment/Plan: 2 Days Post-Op Procedure(s) (LRB): IRRIGATION AND DEBRIDEMENT EXTREMITY (Left)  Advance diet Up with therapy Will order OT for AROM and PROM may benefit from a left UE splint or functional brace. CHeck CBC today and if stable will consider transfer to floor  Davey Limas E 08/08/2011, 11:49 AM

## 2011-08-08 NOTE — Progress Notes (Signed)
Pt transferred to [id number]. VSS. Report called to nurse. Belongings sent with pt.

## 2011-08-08 NOTE — Progress Notes (Signed)
Received call from nursing re Hgb and Hct Hgb is 8.3. Improved from last H/H. Started ferrous sulfate. Stable to  Transfer to regular floor. OT consult requested.

## 2011-08-09 ENCOUNTER — Encounter (HOSPITAL_COMMUNITY): Payer: Self-pay | Admitting: Orthopedic Surgery

## 2011-08-09 MED ORDER — SURGILUBE EX GEL
Freq: Every day | CUTANEOUS | Status: DC
  Administered 2011-08-09: 23:00:00 via TOPICAL
  Filled 2011-08-09 (×10): qty 3

## 2011-08-09 NOTE — Progress Notes (Signed)
Utilization review completed. Permelia Bamba, RN, BSN. 08/09/11  

## 2011-08-09 NOTE — Progress Notes (Signed)
Patient ID: Connor Ford, male   DOB: Apr 21, 1945, 66 y.o.   MRN: [id number] Wound care nursing to change wound VAC left arm today. Anticipate discharge to home on Wednesday. Occupational therapy to start working on range of motion of his fingers. Patient has no complaints.

## 2011-08-09 NOTE — Clinical Documentation Improvement (Signed)
Anemia Blood Loss Clarification  THIS DOCUMENT IS NOT A PERMANENT PART OF THE MEDICAL RECORD  RESPOND TO THE THIS QUERY, FOLLOW THE INSTRUCTIONS BELOW:  1. If needed, update documentation for the patient's encounter via the notes activity.  2. Access this query again and click edit on the In [employer].  3. After updating, or not, click F2 to complete all highlighted (required) fields concerning your review. Select "additional documentation in the medical record" OR "no additional documentation provided".  4. Click Sign note button.  5. The deficiency will fall out of your In Basket *Please let us know if you are not able to complete this workflow by phone or e-mail (listed below).        08/09/11  Dear Dr. Jonathon Bellows  Marton Redwood  In an effort to better capture your patient's severity of illness, reflect appropriate length of stay and utilization of resources, a review of the patient medical record has revealed the following indicators.    Based on your clinical judgment, please clarify and document in a progress note and/or discharge summary the clinical condition associated with the following supporting information:  In responding to this query please exercise your independent judgment.  The fact that a query is asked, does not imply that any particular answer is desired or expected.  Noted patient had transfusion of 2 units of PRBC  on 5/11 and started on Ferrous Sulfate po bid,and Hgb down to 6.3 and 6.8 on 5/10.  Please clarify secondary diagnosis if appropriate. Thank you     Possible Clinical Conditions?   " Expected Acute Blood Loss Anemia  " Acute Blood Loss Anemia  " Acute on chronic blood loss anemia  " Other Condition________________  " Cannot Clinically Determine     Risk Factors: s/p I + D of Left ext and  s/p necrotic wound breakdown after fasciotomy //EBL minimal   Pre-OP H&H: 5/09 H/H 9.3/29.6  Post OP H&H: 5/10 H/H 6.3/18.7  H&H after TX: 5/12 H/H  8.1/24.6  Transfusion: 5/11 2 units PRBC  Plasma expanders: , Transfused 2 units  of FFP and 1 unit of Platelets  Serial H&H monitoring: cbc daily 5/10-5/13  Medications  Ferrous Sulfate 325 mg bid po  Reviewed: additional documentation in the medical record  Thank You,  Leonette Most Addison  Clinical Documentation Specialist RN, BSN :  Pager [phone]/  [place] # [phone]  Health Information Management [place]
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
## 2011-08-09 NOTE — Clinical Documentation Improvement (Signed)
EXCISIONAL DEBRIDEMENT DOCUMENTATION CLARIFICATION  THIS DOCUMENT IS NOT A PERMANENT PART OF THE MEDICAL RECORD  TO RESPOND TO THE THIS QUERY, FOLLOW THE INSTRUCTIONS BELOW:  1. If needed, update documentation for the patient's encounter via the notes activity.  2. Access this query again and click edit on the In [employer].  3. After updating, or not, click F2 to complete all highlighted (required) fields concerning your review. Select "additional documentation in the medical record" OR "no additional documentation provided".  4. Click Sign note button.  5. The deficiency will fall out of your In Basket *Please let us know if you are not able to complete this workflow by phone or e-mail (listed below).  Please update your documentation within the medical record to reflect your response to this query.                                                                                        08/09/11   Dear Dr.M. Lajoyce Corners / Associates,  In a better effort to capture your patient's severity of illness, reflect appropriate length of stay and utilization of resources, a review of the patient medical record has revealed the following indicators.    Based on your clinical judgment, please clarify and document in a progress note and/or discharge summary the clinical condition associated with the following supporting information:  In responding to this query please exercise your independent judgment.  The fact that a query is asked, does not imply that any particular answer is desired or expected. Because that is documentation in the medical record of "debridement" clarification is needed.    Per Op Note "The sutures were removed necrotic muscle necrotic skin and necrotic fascia was debrided",  for accuracy of charting could you please be more specific regarding areas listed below: Excisional or non, type of instrument used, size of area debrided, which are not listed in op note.Thank you   Please  document the below (4) key elements in a progress note:  1.  Type of Debridement: (recommended that use of the word excisional or non excisional is documented in note for greater specificity of type of debridement)            Excisional Debridement-  Cutting away necrotic, devitalized tissue or slough to  Level of viable tissue using a sharp instrument (example, scalpel, scissors, etc).           NON Excisional Debridement-  The removal of necrotic, devitalized tissue or slough by means of scraping, mechanical brushing, flushing, or washing. (example: irrigation, whirlpool);  Minor removal of loose fragments  2.  Please indicate instrument used:  Scissors, Scalpel, Curette, or other  3.  Document the size of the debridement.  You may use possible, probable, or suspect with inpatient documentation. possible, probable, suspected diagnoses MUST be documented at the time of discharge  Reviewed: additional documentation in the medical record  Thank You,  Lavonda Jumbo  Clinical Documentation Specialist RN, BSN:  Pager [phone], HIM off # [phone]  Health Information Management [place]

## 2011-08-10 MED ORDER — DOXYCYCLINE HYCLATE 100 MG PO TABS: 100.0000 mg | ORAL_TABLET | Freq: Two times a day (BID) | ORAL | Status: AC

## 2011-08-10 MED FILL — Hetastarch (HES /0.7 or /0.75) 6% in Electrolytes IV Soln: INTRAVENOUS | Qty: 500 | Status: AC

## 2011-08-10 NOTE — Care Management Note (Addendum)
    Page 1 of 1   08/10/2011     10:05:34 AM   CARE MANAGEMENT NOTE 08/10/2011  Patient:  Connor Ford, Connor Ford   Account Number:  [id number]  Date Initiated:  08/10/2011  Documentation initiated by:  Anette Guarneri  Subjective/Objective Assessment:   Wound dehiscence  VAC  VAC d/cd  Wife is retired Engineer, civil (consulting) per patient and will be able to do dressing changes.     Action/Plan:   follow for home VAC need  MD removed VAC 08/10/11,   Anticipated DC Date:  08/13/2011   Anticipated DC Plan:  HOME/SELF CARE         Choice offered to / List presented to:             Status of service:  Completed, signed off Medicare Important Message given?  NO (If response is "NO", the following Medicare IM given date fields will be blank) Date Medicare IM given:   Date Additional Medicare IM given:    Discharge Disposition:  HOME/SELF CARE  Per UR Regulation:  Reviewed for med. necessity/level of care/duration of stay  If discussed at Long Length of Stay Meetings, dates discussed:    Comments:

## 2011-08-10 NOTE — Discharge Instructions (Signed)
Hydrogel ointment dressing changes to the left arm wound daily. Leave the Adaptic in place apply hydrogel and cover with gauze and Kerlix wrap change daily.

## 2011-08-10 NOTE — Discharge Summary (Signed)
Physician Discharge Summary  Patient ID: Connor Ford MRN: [id number] DOB/AGE: [birth year] 66 y.o.  Admit date: 08/06/2011 Discharge date: 08/10/2011  Admission Diagnoses: Wound dehiscence and necrosis left arm status post crush injury with compartment syndrome  Discharge Diagnoses: Same Active Problems:  * No active hospital problems. *    Discharged Condition: stable  Hospital Course: Patient's hospital course was essentially unremarkable. He underwent irrigation debridement carpal tunnel release application of xenograft tissue graft and application of a wound VAC. The wound VAC was removed yesterday. The xenograft tissue graft is stable and intact. We will have him discharged to home with starting dressing changes and doxycycline antibiotics.  Consults: None  Significant Diagnostic Studies: labs: Routine labs  Treatments: surgery: Please see operative note  Discharge Exam: Blood pressure 121/61, pulse 77, temperature 97.7 F (36.5 C), temperature source Oral, resp. rate 18, height 6\' 4"  (1.93 m), weight 86.1 kg (189 lb 13.1 oz), SpO2 100.00%. Incision/Wound: incision clean dry and stable at time of discharge  Disposition: 01-Home or Self Care   Medication List  As of 08/10/2011  6:55 AM   STOP taking these medications         warfarin 5 MG tablet         TAKE these medications         aspirin EC 81 MG tablet   Take 81 mg by mouth daily.      atorvastatin 10 MG tablet   Commonly known as: LIPITOR   Take 10 mg by mouth daily.      digoxin 0.125 MG tablet   Commonly known as: LANOXIN   Take 125 mcg by mouth daily.      diltiazem 240 MG 24 hr capsule   Commonly known as: CARDIZEM CD   Take 240 mg by mouth daily.      doxycycline 100 MG tablet   Commonly known as: VIBRA-TABS   Take 1 tablet (100 mg total) by mouth 2 (two) times daily.      furosemide 40 MG tablet   Commonly known as: LASIX   Take 40 mg by mouth daily.      potassium chloride 10 MEQ tablet    Commonly known as: K-DUR,KLOR-CON   Take 10 mEq by mouth daily.      ramipril 5 MG capsule   Commonly known as: ALTACE   Take 5 mg by mouth daily.           Follow-up Information    Follow up with Valari Taylor V, MD in 1 week.   Contact information:   [address] [place] [address] [phone]          Signed: Nadara Mustard 08/10/2011, 6:55 AM

## 2011-08-17 NOTE — Progress Notes (Signed)
Surgery on 08/06/2011 included excisional debridement. 21 blade scalpel was used for debridement of nonviable tissue. Wound measurement approximately 10 x 6".

## 2011-08-17 NOTE — Progress Notes (Signed)
Expected blood loss anemia postoperatively do to bleeding secondary to patient's anticoagulation therapy.

## 2011-08-31 ENCOUNTER — Encounter (HOSPITAL_COMMUNITY): Payer: Self-pay | Admitting: Pharmacy Technician

## 2011-08-31 ENCOUNTER — Other Ambulatory Visit (HOSPITAL_COMMUNITY): Payer: Self-pay | Admitting: Orthopedic Surgery

## 2011-09-02 ENCOUNTER — Encounter (HOSPITAL_COMMUNITY): Payer: Self-pay | Admitting: *Deleted

## 2011-09-02 NOTE — Progress Notes (Signed)
CONFIRMED LAB APPOINTMENT WITH PATIENT FOR 09/03/11  1600.  REQUESTED LAST OV AND STRESS TEST FROM DR BRIAN MUNLEY. (LAST ECHO AND CARDIAC CATH ON CHART).  PATIENT STATES HE SPOKE WITH DR DUDA RE COUMADIN AND HE WILL ONLY DECREASE HIS DOSE PRIOR TO SURGERY.

## 2011-09-02 NOTE — Pre-Procedure Instructions (Signed)
20 Connor Ford  09/02/2011   Your procedure is scheduled on: Wednesday 09/08/11    Report to [facility] at 930 AM.  Call this number if you have problems the morning of surgery: [phone]   Remember:   Do not eat food:After Midnight.  May have clear liquids: up to 4 Hours before arrival.(UNTIL 530AM)  Clear liquids include soda, tea, black coffee, apple or grape juice, broth.  Take these medicines the morning of surgery with A SIP OF WATER: DIGOXIN(LANOXIN), DILTIAZEM, POTASSIUM   Do not wear jewelry, make-up or nail polish.  Do not wear lotions, powders, or perfumes. You may wear deodorant.  Do not shave 48 hours prior to surgery. Men may shave face and neck.  Do not bring valuables to the hospital.  Contacts, dentures or bridgework may not be worn into surgery.  Leave suitcase in the car. After surgery it may be brought to your room.  For patients admitted to the hospital, checkout time is 11:00 AM the day of discharge.   Patients discharged the day of surgery will not be allowed to drive home.  Name and phone number of your driver:   Special Instructions: CHG Shower Use Special Wash: 1/2 bottle night before surgery and 1/2 bottle morning of surgery.   Please read over the following fact sheets that you were given: Pain Booklet, Coughing and Deep Breathing, MRSA Information and Surgical Site Infection Prevention

## 2011-09-03 ENCOUNTER — Ambulatory Visit (HOSPITAL_COMMUNITY)
Admission: RE | Admit: 2011-09-03 | Discharge: 2011-09-03 | Disposition: A | Discharge status: Home / Self Care | Payer: Medicare PPO | Source: Ambulatory Visit | Attending: Orthopedic Surgery | Admitting: Orthopedic Surgery

## 2011-09-03 ENCOUNTER — Encounter (HOSPITAL_COMMUNITY)
Admission: RE | Admit: 2011-09-03 | Discharge: 2011-09-03 | Disposition: A | Discharge status: Home / Self Care | Payer: Medicare PPO | Source: Ambulatory Visit | Attending: Orthopedic Surgery | Admitting: Orthopedic Surgery

## 2011-09-03 DIAGNOSIS — L980 Pyogenic granuloma: Secondary | ICD-10-CM | POA: Insufficient documentation

## 2011-09-03 DIAGNOSIS — Z951 Presence of aortocoronary bypass graft: Secondary | ICD-10-CM | POA: Insufficient documentation

## 2011-09-03 DIAGNOSIS — J8410 Pulmonary fibrosis, unspecified: Secondary | ICD-10-CM | POA: Insufficient documentation

## 2011-09-03 DIAGNOSIS — Z01818 Encounter for other preprocedural examination: Secondary | ICD-10-CM | POA: Insufficient documentation

## 2011-09-03 DIAGNOSIS — I517 Cardiomegaly: Secondary | ICD-10-CM | POA: Insufficient documentation

## 2011-09-03 DIAGNOSIS — Z9889 Other specified postprocedural states: Secondary | ICD-10-CM | POA: Insufficient documentation

## 2011-09-03 LAB — CBC
MCH: 26 pg (ref 26.0–34.0)
MCV: 83.9 fL (ref 78.0–100.0)
Platelets: 222 10*3/uL (ref 150–400)
RDW: 15.9 % — ABNORMAL HIGH (ref 11.5–15.5)
WBC: 7 10*3/uL (ref 4.0–10.5)

## 2011-09-03 LAB — SURGICAL PCR SCREEN
MRSA, PCR: NEGATIVE
Staphylococcus aureus: NEGATIVE

## 2011-09-03 LAB — COMPREHENSIVE METABOLIC PANEL
ALT: 11 U/L (ref 0–53)
AST: 16 U/L (ref 0–37)
Albumin: 3.4 g/dL — ABNORMAL LOW (ref 3.5–5.2)
Calcium: 8.8 mg/dL (ref 8.4–10.5)
Chloride: 102 mEq/L (ref 96–112)
Creatinine, Ser: 0.78 mg/dL (ref 0.50–1.35)
Sodium: 138 mEq/L (ref 135–145)

## 2011-09-03 LAB — PROTIME-INR: INR: 1.38 (ref 0.00–1.49)

## 2011-09-03 NOTE — Pre-Procedure Instructions (Signed)
20 Connor Ford  09/03/2011   Your procedure is scheduled on:  Wednesday September 08, 2011.   Report to [facility] at 0930 AM.  Call this number if you have problems the morning of surgery: [phone]   Remember:   Do not eat food:After Midnight.  May have clear liquids: up to 4 Hours before arrival until 0530 am.  Clear liquids include soda, tea, black coffee, apple or grape juice, broth.  Take these medicines the morning of surgery with A SIP OF WATER: Digoxin (Lanoxin), and Diltiazem (Dilacor).   Do not wear jewelry.  Do not wear lotions or colognes.  Men may shave face and neck.  Do not bring valuables to the hospital.  Contacts, dentures or bridgework may not be worn into surgery.  Leave suitcase in the car. After surgery it may be brought to your room.  For patients admitted to the hospital, checkout time is 11:00 AM the day of discharge.   Patients discharged the day of surgery will not be allowed to drive home.  Name and phone number of your driver:   Special Instructions: CHG Shower Use Special Wash: 1/2 bottle night before surgery and 1/2 bottle morning of surgery.   Please read over the following fact sheets that you were given: Pain Booklet, Coughing and Deep Breathing, MRSA Information and Surgical Site Infection Prevention

## 2011-09-06 NOTE — Consult Note (Signed)
Anesthesia Chart Review:  Patient is a 67 year old male scheduled for split thickness skin graft from left thigh to left forearm on 09/08/11.  He is s/p I&D left forearm and application of a cell xenograft tissue graft with wound VAC placement on 08/06/11, and he is s/p fasciotomy and extensive debridement for a left FA crush injury on 07/14/11 and I&D on 07/16/11.  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 His Cardiologist is Dr. Dulce Sellar of [facility]), last visit 04/02/11. He was evaluated by [facility] Cardiologist Dr. Johney Frame during his hospitalization in April 2013.   EKG from 07/15/11 showed afib with RVR at 108 bpm, nonspecific IVC block, nonspecific T wave abnormality.   Echo from 07/15/11 showed: - Left ventricle: Septal dyssynergy (IVCD) The cavity size was at the upper limits of normal. Wall thickness was increased in a pattern of moderate LVH. The estimated ejection fraction was 40%. Diffuse hypokinesis. - Aortic valve: Leaflets are calcified. Ther is moderate AS. There is moderate AI. Peak velocity: 309cm/s (S). Mean gradient: 18mm Hg (S). Peak gradient: 38mm Hg (S). - Mitral valve: The mitral leaflets are thickened. There is mitral stenosis that is probably moderate. - Left atrium: The atrium was moderately dilated. - Right ventricle: The cavity size was mildly dilated. Systolic function was mildly reduced. - Right atrium: The atrium was moderately dilated. - Pulmonary arteries: PA peak pressure: 32mm Hg (S). - Impressions: Many of the valvular findings were present 2009, but they are worse now. This echo is very complex. The AI interferes with the mitral inflow signal. This makes it difficult to fully assess the mitral inflow.  Lexiscan stress test [facility]) on 04/30/08 showed no evidence of ischemia, dilated LV cavity,  EFl 47%.  He had an abnormal CXR on 08/05/11 so a chest CT was done on 08/07/11 which showed: 1. Negative for pulmonary mass.  2. Calcified pleural plaques.  3. Findings consistent with old granulomatous disease.  4. Emphysema.  5. Very small bilateral pleural effusions.   CXR on 09/03/11 showed: 1. Cardiac enlargement without heart failure.  2. Prior granulomatous disease.  3. Chronic interstitial lung disease.   Labs noted.  By notes, Dr. Lajoyce Corners is only having him lower his Coumadin dose, so will repeat coags pre-operatively.  His H/H has increased to 9.5/30.7.  Will go ahead and order a T&S as well.  Shonna Chock, PA-C

## 2011-09-07 MED ORDER — CEFAZOLIN SODIUM-DEXTROSE 2-3 GM-% IV SOLR
2.0000 g | INTRAVENOUS | Status: AC
  Administered 2011-09-08: 2 g via INTRAVENOUS
  Filled 2011-09-07: qty 50

## 2011-09-08 ENCOUNTER — Inpatient Hospital Stay (HOSPITAL_COMMUNITY)
Admission: RE | Admit: 2011-09-08 | Discharge: 2011-09-09 | DRG: 905 | Disposition: A | Discharge status: Home / Self Care | Payer: Medicare PPO | Source: Ambulatory Visit | Attending: Orthopedic Surgery | Admitting: Orthopedic Surgery

## 2011-09-08 ENCOUNTER — Ambulatory Visit (HOSPITAL_COMMUNITY): Payer: Medicare PPO | Admitting: Vascular Surgery

## 2011-09-08 ENCOUNTER — Encounter (HOSPITAL_COMMUNITY): Payer: Self-pay | Admitting: Vascular Surgery

## 2011-09-08 ENCOUNTER — Encounter (HOSPITAL_COMMUNITY)
Admission: RE | Disposition: A | Discharge status: Home / Self Care | Payer: Self-pay | Source: Ambulatory Visit | Attending: Orthopedic Surgery

## 2011-09-08 ENCOUNTER — Encounter (HOSPITAL_COMMUNITY): Payer: Self-pay | Admitting: Surgery

## 2011-09-08 DIAGNOSIS — Z85828 Personal history of other malignant neoplasm of skin: Secondary | ICD-10-CM

## 2011-09-08 DIAGNOSIS — Z77090 Contact with and (suspected) exposure to asbestos: Secondary | ICD-10-CM

## 2011-09-08 DIAGNOSIS — T81329A Deep disruption or dehiscence of operation wound, unspecified, initial encounter: Principal | ICD-10-CM | POA: Diagnosis present

## 2011-09-08 DIAGNOSIS — I739 Peripheral vascular disease, unspecified: Secondary | ICD-10-CM | POA: Diagnosis present

## 2011-09-08 DIAGNOSIS — Y92009 Unspecified place in unspecified non-institutional (private) residence as the place of occurrence of the external cause: Secondary | ICD-10-CM

## 2011-09-08 DIAGNOSIS — I10 Essential (primary) hypertension: Secondary | ICD-10-CM | POA: Diagnosis present

## 2011-09-08 DIAGNOSIS — Y834 Other reconstructive surgery as the cause of abnormal reaction of the patient, or of later complication, without mention of misadventure at the time of the procedure: Secondary | ICD-10-CM | POA: Diagnosis present

## 2011-09-08 DIAGNOSIS — I2510 Atherosclerotic heart disease of native coronary artery without angina pectoris: Secondary | ICD-10-CM | POA: Diagnosis present

## 2011-09-08 DIAGNOSIS — Z7901 Long term (current) use of anticoagulants: Secondary | ICD-10-CM

## 2011-09-08 DIAGNOSIS — Z87891 Personal history of nicotine dependence: Secondary | ICD-10-CM

## 2011-09-08 DIAGNOSIS — E785 Hyperlipidemia, unspecified: Secondary | ICD-10-CM | POA: Diagnosis present

## 2011-09-08 DIAGNOSIS — Z79899 Other long term (current) drug therapy: Secondary | ICD-10-CM

## 2011-09-08 DIAGNOSIS — S41109A Unspecified open wound of unspecified upper arm, initial encounter: Secondary | ICD-10-CM

## 2011-09-08 DIAGNOSIS — Z7982 Long term (current) use of aspirin: Secondary | ICD-10-CM

## 2011-09-08 DIAGNOSIS — Z951 Presence of aortocoronary bypass graft: Secondary | ICD-10-CM

## 2011-09-08 DIAGNOSIS — T8132XA Disruption of internal operation (surgical) wound, not elsewhere classified, initial encounter: Principal | ICD-10-CM | POA: Diagnosis present

## 2011-09-08 HISTORY — DX: Cardiac murmur, unspecified: R01.1

## 2011-09-08 HISTORY — PX: SKIN SPLIT GRAFT: SHX444

## 2011-09-08 LAB — TYPE AND SCREEN
ABO/RH(D): O POS
Antibody Screen: NEGATIVE

## 2011-09-08 SURGERY — APPLICATION, GRAFT, SKIN, SPLIT-THICKNESS
Anesthesia: General | Site: Arm Lower | Laterality: Left | Wound class: Clean

## 2011-09-08 MED ORDER — ONDANSETRON HCL 4 MG PO TABS: 4.0000 mg | ORAL_TABLET | Freq: Four times a day (QID) | ORAL | Status: DC | PRN

## 2011-09-08 MED ORDER — RAMIPRIL 5 MG PO CAPS
5.0000 mg | ORAL_CAPSULE | Freq: Every day | ORAL | Status: DC
  Administered 2011-09-09: 5 mg via ORAL
  Filled 2011-09-08 (×2): qty 1

## 2011-09-08 MED ORDER — FUROSEMIDE 40 MG PO TABS
40.0000 mg | ORAL_TABLET | Freq: Every day | ORAL | Status: DC
  Administered 2011-09-09: 40 mg via ORAL
  Filled 2011-09-08 (×3): qty 1

## 2011-09-08 MED ORDER — LACTATED RINGERS IV SOLN
INTRAVENOUS | Status: DC | PRN
  Administered 2011-09-08: 10:00:00 via INTRAVENOUS

## 2011-09-08 MED ORDER — HYDROMORPHONE HCL PF 1 MG/ML IJ SOLN
INTRAMUSCULAR | Status: AC
  Filled 2011-09-08: qty 1

## 2011-09-08 MED ORDER — SODIUM CHLORIDE 0.9 % IV SOLN
INTRAVENOUS | Status: DC
  Administered 2011-09-08: 1000 mL via INTRAVENOUS

## 2011-09-08 MED ORDER — METOCLOPRAMIDE HCL 5 MG/ML IJ SOLN: 5.0000 mg | Freq: Three times a day (TID) | INTRAMUSCULAR | Status: DC | PRN

## 2011-09-08 MED ORDER — ONDANSETRON HCL 4 MG/2ML IJ SOLN
INTRAMUSCULAR | Status: DC | PRN
  Administered 2011-09-08: 4 mg via INTRAVENOUS

## 2011-09-08 MED ORDER — ONDANSETRON HCL 4 MG/2ML IJ SOLN: 4.0000 mg | Freq: Four times a day (QID) | INTRAMUSCULAR | Status: DC | PRN

## 2011-09-08 MED ORDER — HYDROMORPHONE HCL PF 1 MG/ML IJ SOLN
0.2500 mg | INTRAMUSCULAR | Status: DC | PRN
  Administered 2011-09-08: 0.5 mg via INTRAVENOUS

## 2011-09-08 MED ORDER — CEFAZOLIN SODIUM 1-5 GM-% IV SOLN
1.0000 g | Freq: Four times a day (QID) | INTRAVENOUS | Status: AC
  Administered 2011-09-08 – 2011-09-09 (×3): 1 g via INTRAVENOUS
  Filled 2011-09-08 (×3): qty 50

## 2011-09-08 MED ORDER — HYDROCODONE-ACETAMINOPHEN 5-325 MG PO TABS: 1.0000 | ORAL_TABLET | ORAL | Status: DC | PRN

## 2011-09-08 MED ORDER — WARFARIN SODIUM 2.5 MG PO TABS
2.5000 mg | ORAL_TABLET | Freq: Every day | ORAL | Status: DC
  Administered 2011-09-08: 2.5 mg via ORAL
  Filled 2011-09-08 (×2): qty 1

## 2011-09-08 MED ORDER — POTASSIUM CHLORIDE CRYS ER 10 MEQ PO TBCR
10.0000 meq | EXTENDED_RELEASE_TABLET | Freq: Every day | ORAL | Status: DC
  Administered 2011-09-08 – 2011-09-09 (×2): 10 meq via ORAL
  Filled 2011-09-08 (×2): qty 1

## 2011-09-08 MED ORDER — DILTIAZEM HCL ER 180 MG PO CP24
180.0000 mg | ORAL_CAPSULE | Freq: Every day | ORAL | Status: DC
  Administered 2011-09-09: 180 mg via ORAL
  Filled 2011-09-08 (×2): qty 1

## 2011-09-08 MED ORDER — 0.9 % SODIUM CHLORIDE (POUR BTL) OPTIME
TOPICAL | Status: DC | PRN
  Administered 2011-09-08: 1000 mL

## 2011-09-08 MED ORDER — HYDROMORPHONE HCL PF 1 MG/ML IJ SOLN: 0.5000 mg | INTRAMUSCULAR | Status: DC | PRN

## 2011-09-08 MED ORDER — WARFARIN - PHYSICIAN DOSING INPATIENT: Freq: Every day | Status: DC

## 2011-09-08 MED ORDER — FENTANYL CITRATE 0.05 MG/ML IJ SOLN
INTRAMUSCULAR | Status: DC | PRN
  Administered 2011-09-08: 100 ug via INTRAVENOUS
  Administered 2011-09-08: 50 ug via INTRAVENOUS

## 2011-09-08 MED ORDER — ATORVASTATIN CALCIUM 20 MG PO TABS
20.0000 mg | ORAL_TABLET | Freq: Every day | ORAL | Status: DC
  Administered 2011-09-08: 20 mg via ORAL
  Filled 2011-09-08 (×2): qty 1

## 2011-09-08 MED ORDER — LIDOCAINE HCL (CARDIAC) 20 MG/ML IV SOLN
INTRAVENOUS | Status: DC | PRN
  Administered 2011-09-08: 100 mg via INTRAVENOUS

## 2011-09-08 MED ORDER — ASPIRIN EC 81 MG PO TBEC
81.0000 mg | DELAYED_RELEASE_TABLET | Freq: Every day | ORAL | Status: DC
  Administered 2011-09-08 – 2011-09-09 (×2): 81 mg via ORAL
  Filled 2011-09-08 (×2): qty 1

## 2011-09-08 MED ORDER — DIGOXIN 125 MCG PO TABS
125.0000 ug | ORAL_TABLET | Freq: Every day | ORAL | Status: DC
  Administered 2011-09-09: 125 ug via ORAL
  Filled 2011-09-08 (×2): qty 1

## 2011-09-08 MED ORDER — DEXTROSE 5 % IV SOLN
INTRAVENOUS | Status: DC | PRN
  Administered 2011-09-08: 11:00:00 via INTRAVENOUS

## 2011-09-08 MED ORDER — METOCLOPRAMIDE HCL 10 MG PO TABS: 5.0000 mg | ORAL_TABLET | Freq: Three times a day (TID) | ORAL | Status: DC | PRN

## 2011-09-08 MED ORDER — ONDANSETRON HCL 4 MG/2ML IJ SOLN: 4.0000 mg | Freq: Once | INTRAMUSCULAR | Status: DC | PRN

## 2011-09-08 MED ORDER — LACTATED RINGERS IV SOLN
INTRAVENOUS | Status: DC
  Administered 2011-09-08: 10:00:00 via INTRAVENOUS

## 2011-09-08 MED ORDER — PROPOFOL 10 MG/ML IV EMUL
INTRAVENOUS | Status: DC | PRN
  Administered 2011-09-08: 200 mg via INTRAVENOUS

## 2011-09-08 MED ORDER — OXYCODONE-ACETAMINOPHEN 5-325 MG PO TABS
1.0000 | ORAL_TABLET | ORAL | Status: DC | PRN
  Administered 2011-09-08 (×2): 2 via ORAL
  Filled 2011-09-08 (×2): qty 2

## 2011-09-08 MED ORDER — MINERAL OIL LIGHT 100 % EX OIL
TOPICAL_OIL | CUTANEOUS | Status: DC | PRN
  Administered 2011-09-08: 1 via TOPICAL

## 2011-09-08 SURGICAL SUPPLY — 46 items
BNDG CMPR 9X4 STRL LF SNTH (GAUZE/BANDAGES/DRESSINGS)
BNDG COHESIVE 6X5 TAN STRL LF (GAUZE/BANDAGES/DRESSINGS) IMPLANT
BNDG ESMARK 4X9 LF (GAUZE/BANDAGES/DRESSINGS) ×1 IMPLANT
BNDG GAUZE STRTCH 6 (GAUZE/BANDAGES/DRESSINGS) IMPLANT
CLOTH BEACON ORANGE TIMEOUT ST (SAFETY) ×2 IMPLANT
COVER SURGICAL LIGHT HANDLE (MISCELLANEOUS) ×2 IMPLANT
CUFF TOURNIQUET SINGLE 18IN (TOURNIQUET CUFF) IMPLANT
CUFF TOURNIQUET SINGLE 24IN (TOURNIQUET CUFF) IMPLANT
DEPRESSOR TONGUE BLADE STERILE (MISCELLANEOUS) ×2 IMPLANT
DERMACARRIERS GRAFT 1 TO 1.5 (DISPOSABLE) ×2
DRAPE EXTREMITY T 121X128X90 (DRAPE) ×1 IMPLANT
DRAPE U-SHAPE 47X51 STRL (DRAPES) ×2 IMPLANT
DRSG ADAPTIC 3X8 NADH LF (GAUZE/BANDAGES/DRESSINGS) IMPLANT
DRSG MEPITEL 4X7.2 (GAUZE/BANDAGES/DRESSINGS) ×5 IMPLANT
DURAPREP 26ML APPLICATOR (WOUND CARE) ×2 IMPLANT
ELECT REM PT RETURN 9FT ADLT (ELECTROSURGICAL) ×2
ELECTRODE REM PT RTRN 9FT ADLT (ELECTROSURGICAL) ×1 IMPLANT
GLOVE BIOGEL PI IND STRL 9 (GLOVE) ×1 IMPLANT
GLOVE BIOGEL PI INDICATOR 9 (GLOVE) ×1
GLOVE SURG ORTHO 9.0 STRL STRW (GLOVE) ×2 IMPLANT
GOWN PREVENTION PLUS XLARGE (GOWN DISPOSABLE) ×2 IMPLANT
GOWN SRG XL XLNG 56XLVL 4 (GOWN DISPOSABLE) ×1 IMPLANT
GOWN STRL NON-REIN XL XLG LVL4 (GOWN DISPOSABLE) ×2
GRAFT DERMACARRIERS 1 TO 1.5 (DISPOSABLE) IMPLANT
KIT BASIN OR (CUSTOM PROCEDURE TRAY) ×2 IMPLANT
KIT ROOM TURNOVER OR (KITS) ×2 IMPLANT
MANIFOLD NEPTUNE II (INSTRUMENTS) ×2 IMPLANT
MATRISTERN SURG MATRIX RS10X15 (Tissue) ×1 IMPLANT
MICROMATRIX 500MG (Tissue) ×2 IMPLANT
NDL HYPO 25GX1X1/2 BEV (NEEDLE) IMPLANT
NEEDLE HYPO 25GX1X1/2 BEV (NEEDLE) IMPLANT
NS IRRIG 1000ML POUR BTL (IV SOLUTION) ×2 IMPLANT
PACK GENERAL/GYN (CUSTOM PROCEDURE TRAY) ×1 IMPLANT
PACK ORTHO EXTREMITY (CUSTOM PROCEDURE TRAY) ×1 IMPLANT
PAD ARMBOARD 7.5X6 YLW CONV (MISCELLANEOUS) ×4 IMPLANT
PAD CAST 4YDX4 CTTN HI CHSV (CAST SUPPLIES) IMPLANT
PADDING CAST COTTON 4X4 STRL (CAST SUPPLIES)
SOLUTION PARTIC MCRMTRX 500MG (Tissue) IMPLANT
SPONGE GAUZE 4X4 12PLY (GAUZE/BANDAGES/DRESSINGS) IMPLANT
SUCTION FRAZIER TIP 10 FR DISP (SUCTIONS) IMPLANT
SUT ETHILON 4 0 PS 2 18 (SUTURE) IMPLANT
SYR CONTROL 10ML LL (SYRINGE) IMPLANT
TOWEL OR 17X24 6PK STRL BLUE (TOWEL DISPOSABLE) ×2 IMPLANT
TOWEL OR 17X26 10 PK STRL BLUE (TOWEL DISPOSABLE) ×2 IMPLANT
TUBE CONNECTING 12X1/4 (SUCTIONS) IMPLANT
WATER STERILE IRR 1000ML POUR (IV SOLUTION) ×2 IMPLANT

## 2011-09-08 NOTE — Transfer of Care (Signed)
Immediate Anesthesia Transfer of Care Note  Patient: Connor Ford  Procedure(s) Performed: Procedure(s) (LRB): SKIN GRAFT SPLIT THICKNESS (Left)  Patient Location: PACU  Anesthesia Type: General  Level of Consciousness: awake, alert  and patient cooperative  Airway & Oxygen Therapy: Patient Spontanous Breathing and Patient connected to nasal cannula oxygen  Post-op Assessment: Report given to PACU RN, Post -op Vital signs reviewed and stable and Patient moving all extremities  Post vital signs: Reviewed and stable  Complications: No apparent anesthesia complications

## 2011-09-08 NOTE — Anesthesia Procedure Notes (Signed)
Procedure Name: LMA Insertion Date/Time: 09/08/2011 10:52 AM Performed by: Darcey Nora B Pre-anesthesia Checklist: Patient identified, Emergency Drugs available, Suction available and Patient being monitored Patient Re-evaluated:Patient Re-evaluated prior to inductionOxygen Delivery Method: Circle system utilized Preoxygenation: Pre-oxygenation with 100% oxygen Intubation Type: IV induction Ventilation: Mask ventilation without difficulty LMA: LMA inserted LMA Size: 5.0 Number of attempts: 1 ETT to lip (cm): taped to mandible; gauze roll b/t teeth. Tube secured with: Tape

## 2011-09-08 NOTE — Anesthesia Postprocedure Evaluation (Signed)
  Anesthesia Post-op Note  Patient: Connor Ford  Procedure(s) Performed: Procedure(s) (LRB): SKIN GRAFT SPLIT THICKNESS (Left)  Patient Location: PACU  Anesthesia Type: General  Level of Consciousness: awake, alert , oriented and patient cooperative  Airway and Oxygen Therapy: Patient Spontanous Breathing and Patient connected to nasal cannula oxygen  Post-op Pain: mild  Post-op Assessment: Post-op Vital signs reviewed, Patient's Cardiovascular Status Stable, Respiratory Function Stable, Patent Airway, No signs of Nausea or vomiting and Pain level controlled  Post-op Vital Signs: stable  Complications: No apparent anesthesia complications

## 2011-09-08 NOTE — Anesthesia Preprocedure Evaluation (Addendum)
Anesthesia Evaluation  Patient identified by MRN, date of birth, ID band Patient awake    Reviewed: Allergy & Precautions, H&P , NPO status , Patient's Chart, lab work & pertinent test results, reviewed documented beta blocker date and time   Airway Mallampati: I TM Distance: >3 FB Neck ROM: full    Dental  (+) Teeth Intact and Dental Advisory Given   Pulmonary shortness of breath,          Cardiovascular hypertension, Pt. on medications + CAD + dysrhythmias Atrial Fibrillation + Valvular Problems/Murmurs AI Rhythm:regular Rate:Normal     Neuro/Psych    GI/Hepatic   Endo/Other    Renal/GU      Musculoskeletal   Abdominal   Peds  Hematology   Anesthesia Other Findings Pt states he took the most recent dose of low dose ASA and a coumadin 09/06/2011, not 06/11.  Reproductive/Obstetrics                         Anesthesia Physical Anesthesia Plan  ASA: III  Anesthesia Plan: General   Post-op Pain Management:    Induction: Intravenous  Airway Management Planned: LMA and Oral ETT  Additional Equipment:   Intra-op Plan:   Post-operative Plan: Extubation in OR  Informed Consent: I have reviewed the patients History and Physical, chart, labs and discussed the procedure including the risks, benefits and alternatives for the proposed anesthesia with the patient or authorized representative who has indicated his/her understanding and acceptance.     Plan Discussed with: CRNA, Anesthesiologist and Surgeon  Anesthesia Plan Comments:         Anesthesia Quick Evaluation

## 2011-09-08 NOTE — Preoperative (Signed)
Beta Blockers   Reason not to administer Beta Blockers:Not Applicable 

## 2011-09-08 NOTE — Op Note (Addendum)
OPERATIVE REPORT  DATE OF SURGERY: 09/08/2011  PATIENT:  Sharyon Medicus,  66 y.o. male  PRE-OPERATIVE DIAGNOSIS:  Open Wound Left Forearm  POST-OPERATIVE DIAGNOSIS:  open wound left forearm  PROCEDURE:  Procedure(s): SKIN GRAFT SPLIT THICKNESS from left thigh to left forearm 70 cm Excisional debridement left forearm using 21 blade knife and Ronjair 2 debridement skin soft tissue muscle and tendon. Application of a cell xenograft with a surface area of 50 cm.  SURGEON:  Surgeon(s): Nadara Mustard, MD  ANESTHESIA:   general  EBL:  Minimal ML  SPECIMEN:  No Specimen  TOURNIQUET:  * No tourniquets in log *  PROCEDURE DETAILS: Patient is a 66 year old gentleman who is status post multiple surgical interventions for crush injury to left forearm. Patient has massive nonhealing wound over the volar aspect the forearm and presents at this time for irrigation and debridement of skin soft tissue muscle and tendon and placement of split thickness skin graft. Risks and benefits of surgery were discussed patient states he understands and wished to proceed at this time. Description of procedure patient's left upper extremity was first scrubbed with Hibiclens and then prepped with Betadine paint and Betadine scrub and draped into a sterile field. The left thigh was also draped into a sterile field. Attention was first focused on the forearm. Patient wound that extended from the cubital fossa to the wrist which was 5 cm in width. Using a 21 blade knife the skin soft tissue muscle and tendon was debrided back to bleeding viable healthy tissue. A split thickness skin graft 3 inches wide was then obtained x2 from the left thigh set at 0.014 inches. This was then meshed 1-1.5 applied to the wound and stapled in place. The donor site was then grafted with a cell xenograft tissue graft with a surface area of the graft being greater than 50 cm.. Area of coverage was greater than 70 cm. Both wounds were then  covered with Mepitel Bactroban cream 4 x 4's ABDs Kerlix and Coban. Hypafix tape was used on the left thigh. A cell powder was placed deep to the split thickness skin graft on the forearm. Patient was extubated taken to the PACU in stable condition plan for admission IV antibiotics discharge to home tomorrow.  PLAN OF CARE: Admit to inpatient   PATIENT DISPOSITION:  PACU - hemodynamically stable.   Nadara Mustard, MD 09/08/2011 11:51 AM

## 2011-09-08 NOTE — H&P (Signed)
Connor Ford is an 66 y.o. male.   Chief Complaint: Open fasciotomy wound left forearm HPI: Patient is a 66 year old gentleman status post crush injury to left forearm who has undergone multiple irrigation and debridements to the forearm use of a wound VAC use of Ace cell xenograft tissue graft who has improved granulation base of the wound but has had progressive episodes of dehiscence of the wound and presents at this time for application of split thickness skin graft.  Past Medical History  Diagnosis Date  . 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 Last echo 03/2007 with mild AS, mild-mod AR, mild-mod MR, mild MS   . Asbestos exposure   . Peripheral vascular disease     a) reported hx of infrainguinal arterial occlusive disease. b) Bilateral CEA  in 2001 with RCEA operation complicated by neck hematoma and l forearm hematoma/compartment syndrome s/p fasciotomy  . Hyperlipidemia   . CAD (coronary artery disease)      followed by dr Dulce Sellar.last seen 03/2011  . HTN (hypertension)     followed by dr Wynonia Sours  . Permanent atrial fibrillation     With LBBB pattern. On Chronic Coumadin  . Shortness of breath   . Heart murmur   . Cancer     HX BASAL CELL SKIN CA    Past Surgical History  Procedure Date  . Carotid endarterectomy     R&L  . Fasciotomy     for compartment syndrome of L forearm following RCEA  . Coronary artery bypass graft   . Mitral valve repair   . Fasciotomy 07/14/2011    Procedure: FASCIOTOMY;  Surgeon: Nadara Mustard, MD;  Location[facility] OR;  Service: Orthopedics;  Laterality: Left;  . I&d extremity 07/16/2011    Procedure: IRRIGATION AND DEBRIDEMENT EXTREMITY;  Surgeon: Nadara Mustard, MD;  Location: [facility] OR;  Service: Orthopedics;  Laterality: Left;  Irrigation and Debridement Left Forearm, apply Acell and Wound  VAC  . I&d extremity 08/06/2011    Procedure: IRRIGATION AND DEBRIDEMENT EXTREMITY;  Surgeon: Nadara Mustard, MD;  Location: [facility] OR;  Service: Orthopedics;  Laterality: Left;  Irrigation and Debridement/Wound Closure Left Forearm  . Leg surgery     2009  . Lung surgery     LEFT RUPTURED LESION  2008 [facility]    Family History  Problem Relation Age of Onset  . Stroke Mother     Died at 26 of a stroke  . Lung disease Brother     Brother died of black lung disease   Social History:  reports that he quit smoking about 12 years ago. His smoking use included Cigarettes. He has a 60 pack-year smoking history. He quit smokeless tobacco use about 12 years ago. His smokeless tobacco use included Snuff. He reports that he drinks about 8.4 ounces of alcohol per week. He reports that he does not use illicit drugs.  Allergies: No Known Allergies  No prescriptions prior to admission    No results found for this or any previous visit (from the past 48 hour(s)). No results found.  Review of Systems  All other systems reviewed and are negative.    There were no vitals taken for this visit. Physical Exam  On examination the extensile incision on the forearm shows necrotic tissue around the wound edges. There is good healthy granulation  tissue at the base of the wound. Patient's carpal tunnel release incision has healed his wound is centralized over the volar aspect of the forearm. Assessment/Plan Assessment: Dehiscence left forearm extensile fasciotomy wound status post multiple surgical interventions for crush injury to the left forearm.  Plan: We'll plan for repeat irrigation and debridement of the wound and application of split thickness skin graft from the left side to the left forearm. Risks and benefits of surgery were discussed patient states he understands and wished to proceed at this time plan for overnight observation.  [name] 09/08/2011, 6:28 AM

## 2011-09-09 LAB — PROTIME-INR: Prothrombin Time: 16.9 seconds — ABNORMAL HIGH (ref 11.6–15.2)

## 2011-09-09 NOTE — Progress Notes (Signed)
Utilization review completed. Novalyn Lajara, RN, BSN. 09/09/11  

## 2011-09-09 NOTE — Discharge Summary (Signed)
Physician Discharge Summary  Patient ID: Connor Ford MRN: [id number] DOB/AGE: 1946/03/21 66 y.o.  Admit date: 09/08/2011 Discharge date: 09/09/2011  Admission Diagnoses: Open wound left forearm status post fasciotomies Discharge Diagnoses: Same Active Problems:  * No active hospital problems. *    Discharged Condition: stable  Hospital Course: Patient's hospital course was essentially unremarkable he underwent split thickness skin graft from the left thigh to the left forearm. Dressing was changed this morning.  Consults: None  Significant Diagnostic Studies: labs: Routine labs  Treatments: surgery: Please see operative note  Discharge Exam: Blood pressure 114/47, pulse 62, temperature 98 F (36.7 C), temperature source Oral, resp. rate 18, SpO2 97.00%. Incision/Wound: incision clean and dry at time of discharge  Disposition: 01-Home or Self Care  Discharge Orders    Future Orders Please Complete By Expires   Diet - low sodium heart healthy      Call MD / Call 911      Comments:   If you experience chest pain or shortness of breath, CALL 911 and be transported to the hospital emergency room.  If you develope a fever above 101 F, pus (white drainage) or increased drainage or redness at the wound, or calf pain, call your surgeon's office.   Constipation Prevention      Comments:   Drink plenty of fluids.  Prune juice may be helpful.  You may use a stool softener, such as Colace (over the counter) 100 mg twice a day.  Use MiraLax (over the counter) for constipation as needed.   Increase activity slowly as tolerated        Medication List  As of 09/09/2011  6:53 AM   TAKE these medications         aspirin EC 81 MG tablet   Take 81 mg by mouth daily.      atorvastatin 20 MG tablet   Commonly known as: LIPITOR   Take 20 mg by mouth daily.      digoxin 0.125 MG tablet   Commonly known as: LANOXIN   Take 125 mcg by mouth daily.      diltiazem 180 MG 24 hr capsule   Commonly known as: DILACOR XR   Take 180 mg by mouth daily.      doxycycline 100 MG tablet   Commonly known as: VIBRA-TABS   Take 100 mg by mouth 2 (two) times daily.      furosemide 40 MG tablet   Commonly known as: LASIX   Take 40 mg by mouth daily.      potassium chloride 10 MEQ tablet   Commonly known as: K-DUR,KLOR-CON   Take 10 mEq by mouth daily.      ramipril 5 MG capsule   Commonly known as: ALTACE   Take 5 mg by mouth daily.      VITAMIN E PO   Take 1 tablet by mouth daily.      warfarin 5 MG tablet   Commonly known as: COUMADIN   Take 2.5 mg by mouth daily.         ASK your doctor about these medications         warfarin 2.5 MG tablet   Commonly known as: COUMADIN   Take 2.5 mg by mouth daily.           Follow-up Information    Follow up with Dasiah Hooley V, MD in 1 week. (Call office today for appointment on Monday.)    Contact information:   [address] [place] [address] [phone]          Signed: Nadara Mustard 09/09/2011, 6:53 AM

## 2011-09-09 NOTE — Discharge Instructions (Signed)
Keep dressing dry and arm elevated above the heart.

## 2011-09-10 ENCOUNTER — Encounter (HOSPITAL_COMMUNITY): Payer: Self-pay | Admitting: Orthopedic Surgery

## 2013-03-29 DIAGNOSIS — J189 Pneumonia, unspecified organism: Secondary | ICD-10-CM

## 2013-03-29 HISTORY — DX: Pneumonia, unspecified organism: J18.9

## 2013-04-09 ENCOUNTER — Ambulatory Visit (INDEPENDENT_AMBULATORY_CARE_PROVIDER_SITE_OTHER): Payer: Medicare PPO | Admitting: Family Medicine

## 2013-04-09 ENCOUNTER — Ambulatory Visit: Payer: Medicare PPO

## 2013-04-09 VITALS — BP 122/58 | HR 72 | Temp 98.7°F | Resp 18 | Ht 76.0 in | Wt 205.0 lb

## 2013-04-09 DIAGNOSIS — R05 Cough: Secondary | ICD-10-CM

## 2013-04-09 DIAGNOSIS — R059 Cough, unspecified: Secondary | ICD-10-CM

## 2013-04-09 DIAGNOSIS — R509 Fever, unspecified: Secondary | ICD-10-CM

## 2013-04-09 MED ORDER — LEVOFLOXACIN 500 MG PO TABS: 500.0000 mg | ORAL_TABLET | Freq: Every day | ORAL | Status: AC

## 2013-04-09 NOTE — Progress Notes (Signed)
Connor Ford is a 68 y.o. male who presents to Urgent Care today with complaints of fever and cough:  1.  Fever and cough:  Started 1 week ago as URI.  Moved to chest 5 days ago.  Wife improved, she was sick at same time.  His cough became much deepened, productive of thick green sputum, keeping him up at night.  Some pleuritic pain only when coughing.  Fever to 102 two separate days at home, relieved with Acetaminophen,    PMH reviewed.  Past Medical History  Diagnosis Date  . 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 Last echo 03/2007 with mild AS, mild-mod AR, mild-mod MR, mild MS   . Asbestos exposure   . Peripheral vascular disease     a) reported hx of infrainguinal arterial occlusive disease. b) Bilateral CEA  in 2001 with RCEA operation complicated by neck hematoma and l forearm hematoma/compartment syndrome s/p fasciotomy  . Hyperlipidemia   . CAD (coronary artery disease)      followed by dr Bettina Gavia.last seen 03/2011  . HTN (hypertension)     followed by dr Lelon Frohlich  . Permanent atrial fibrillation     With LBBB pattern. On Chronic Coumadin  . Shortness of breath   . Heart murmur   . Cancer     HX BASAL CELL SKIN CA  . Blood transfusion without reported diagnosis   . CHF (congestive heart failure)    Past Surgical History  Procedure Laterality Date  . Carotid endarterectomy      R&L  . Fasciotomy      for compartment syndrome of L forearm following RCEA  . Coronary artery bypass graft    . Mitral valve repair    . Fasciotomy  07/14/2011    Procedure: FASCIOTOMY;  Surgeon: Newt Minion, MD;  Location: [place];  Service: Orthopedics;  Laterality: Left;  . I&d extremity  07/16/2011    Procedure: IRRIGATION AND DEBRIDEMENT EXTREMITY;  Surgeon: Newt Minion, MD;  Location: [place];  Service: Orthopedics;  Laterality:  Left;  Irrigation and Debridement Left Forearm, apply Acell and Wound VAC  . I&d extremity  08/06/2011    Procedure: IRRIGATION AND DEBRIDEMENT EXTREMITY;  Surgeon: Newt Minion, MD;  Location: [place];  Service: Orthopedics;  Laterality: Left;  Irrigation and Debridement/Wound Closure Left Forearm  . Leg surgery      2009  . Lung surgery      LEFT RUPTURED LESION  2008 [facility]  . Skin split graft  09/08/2011    Procedure: SKIN GRAFT SPLIT THICKNESS;  Surgeon: Newt Minion, MD;  Location: [place];  Service: Orthopedics;  Laterality: Left;  Split Thickness Skin Graft from Left Thigh to Left Forearm   . Eye surgery    . Vasectomy      Medications reviewed. Current Outpatient Prescriptions  Medication Sig Dispense Refill  . aspirin EC 81 MG tablet Take 81 mg by mouth daily.      Marland Kitchen atorvastatin (LIPITOR) 20 MG tablet Take 20 mg by mouth daily.      . digoxin (LANOXIN) 0.125 MG tablet Take 125 mcg by mouth daily.      Marland Kitchen diltiazem (DILACOR XR) 180 MG 24 hr capsule Take 180 mg by mouth daily.      . furosemide (LASIX) 40 MG tablet Take 40 mg  by mouth daily.      . potassium chloride (K-DUR,KLOR-CON) 10 MEQ tablet Take 10 mEq by mouth daily.      . ramipril (ALTACE) 5 MG capsule Take 5 mg by mouth daily.      Marland Kitchen warfarin (COUMADIN) 5 MG tablet Take 2.5 mg by mouth daily.      Marland Kitchen doxycycline (VIBRA-TABS) 100 MG tablet Take 100 mg by mouth 2 (two) times daily.      Marland Kitchen VITAMIN E PO Take 1 tablet by mouth daily.       No current facility-administered medications for this visit.    ROS as above otherwise neg.  No chest pain, palpitations, SOB, Fever, Chills, Abd pain, N/V/D.   Physical Exam:  BP 122/58  Pulse 72  Temp(Src) 98.7 F (37.1 C) (Oral)  Resp 18  Ht 6\' 4"  (1.93 m)  Wt 205 lb (92.987 kg)  BMI 24.96 kg/m2  SpO2 90% Gen:  Alert, cooperative patient who appears stated age in no acute distress.  Vital signs reviewed. HEENT: EOMI,  MMM Pulm:  Minimal wheezing throughout.  Some  crackles over Right mid-chest.  Rhonchi BL bases.   Cardiac:  Regular rate.  Grade III SEM noted bilateral upper sternal borders.   Abd:  Soft/nondistended/nontender.   Exts: No edema  [facility] reading (PRIMARY) by  Dr. Mingo Amber:  Increased opacity Right middle lobe.  Chronic scarring.  Chronic Left pleural effusion.  Assessment and Plan:  1.  Questionable PNA: - Has chronic Right middle lobe scarring but this looks like worsened opacity superimposed over scarring.  - cough with fever plus radiographic changes = PNA.  Treat with Levaquin.  I have looked at his most recent EKG, no prolonged QTc.  Warned about changes in coumadin on this medicine.   - Return precautions provided.

## 2013-04-09 NOTE — Patient Instructions (Signed)
It looks like you might have a pneumonia.  I'm sending in an antibiotic right now.  Pick it up and take the first dose tonight.  Once a day.  The radiologist will read it to be sure, but I want you taking this anyway.    If you start having trouble breathing, worse cough, fevers despite the antibiotic, you need to come back.   It was good to meet you!

## 2013-04-10 ENCOUNTER — Telehealth: Payer: Self-pay | Admitting: Family Medicine

## 2013-04-10 NOTE — Telephone Encounter (Signed)
Called and relayed message regarding large PNA on cxr.  Should have FU CXR to assess for clearing in several weeks.  He is planning on going out of town this coming Friday but he should be seen Thursday to ensure he's well enough to travel and to ensure that he's had any improvement.  Had to leave message regarding this b/c no answer.

## 2013-04-12 ENCOUNTER — Encounter (HOSPITAL_COMMUNITY): Payer: Self-pay | Admitting: Emergency Medicine

## 2013-04-12 ENCOUNTER — Inpatient Hospital Stay (HOSPITAL_COMMUNITY)
Admission: EM | Admit: 2013-04-12 | Discharge: 2013-05-27 | DRG: 208 | Disposition: E | Payer: Medicare PPO | Attending: Pulmonary Disease | Admitting: Pulmonary Disease

## 2013-04-12 ENCOUNTER — Emergency Department (HOSPITAL_COMMUNITY): Payer: Medicare PPO

## 2013-04-12 DIAGNOSIS — R6521 Severe sepsis with septic shock: Secondary | ICD-10-CM

## 2013-04-12 DIAGNOSIS — K560 Paralytic ileus: Secondary | ICD-10-CM | POA: Diagnosis not present

## 2013-04-12 DIAGNOSIS — E873 Alkalosis: Secondary | ICD-10-CM | POA: Diagnosis not present

## 2013-04-12 DIAGNOSIS — R740 Nonspecific elevation of levels of transaminase and lactic acid dehydrogenase [LDH]: Secondary | ICD-10-CM

## 2013-04-12 DIAGNOSIS — Z85828 Personal history of other malignant neoplasm of skin: Secondary | ICD-10-CM

## 2013-04-12 DIAGNOSIS — R6520 Severe sepsis without septic shock: Secondary | ICD-10-CM

## 2013-04-12 DIAGNOSIS — I5023 Acute on chronic systolic (congestive) heart failure: Secondary | ICD-10-CM | POA: Diagnosis present

## 2013-04-12 DIAGNOSIS — T380X5A Adverse effect of glucocorticoids and synthetic analogues, initial encounter: Secondary | ICD-10-CM | POA: Diagnosis not present

## 2013-04-12 DIAGNOSIS — I4891 Unspecified atrial fibrillation: Secondary | ICD-10-CM

## 2013-04-12 DIAGNOSIS — E875 Hyperkalemia: Secondary | ICD-10-CM | POA: Diagnosis not present

## 2013-04-12 DIAGNOSIS — F411 Generalized anxiety disorder: Secondary | ICD-10-CM | POA: Diagnosis not present

## 2013-04-12 DIAGNOSIS — Z951 Presence of aortocoronary bypass graft: Secondary | ICD-10-CM

## 2013-04-12 DIAGNOSIS — Z515 Encounter for palliative care: Secondary | ICD-10-CM

## 2013-04-12 DIAGNOSIS — I214 Non-ST elevation (NSTEMI) myocardial infarction: Secondary | ICD-10-CM | POA: Diagnosis not present

## 2013-04-12 DIAGNOSIS — J189 Pneumonia, unspecified organism: Secondary | ICD-10-CM

## 2013-04-12 DIAGNOSIS — R34 Anuria and oliguria: Secondary | ICD-10-CM | POA: Diagnosis not present

## 2013-04-12 DIAGNOSIS — D72829 Elevated white blood cell count, unspecified: Secondary | ICD-10-CM

## 2013-04-12 DIAGNOSIS — D649 Anemia, unspecified: Secondary | ICD-10-CM | POA: Diagnosis not present

## 2013-04-12 DIAGNOSIS — J101 Influenza due to other identified influenza virus with other respiratory manifestations: Secondary | ICD-10-CM | POA: Diagnosis present

## 2013-04-12 DIAGNOSIS — E872 Acidosis, unspecified: Secondary | ICD-10-CM | POA: Diagnosis not present

## 2013-04-12 DIAGNOSIS — Z7901 Long term (current) use of anticoagulants: Secondary | ICD-10-CM

## 2013-04-12 DIAGNOSIS — G4733 Obstructive sleep apnea (adult) (pediatric): Secondary | ICD-10-CM | POA: Diagnosis present

## 2013-04-12 DIAGNOSIS — R0902 Hypoxemia: Secondary | ICD-10-CM

## 2013-04-12 DIAGNOSIS — I2789 Other specified pulmonary heart diseases: Secondary | ICD-10-CM | POA: Diagnosis present

## 2013-04-12 DIAGNOSIS — J1100 Influenza due to unidentified influenza virus with unspecified type of pneumonia: Principal | ICD-10-CM | POA: Diagnosis present

## 2013-04-12 DIAGNOSIS — J8410 Pulmonary fibrosis, unspecified: Secondary | ICD-10-CM | POA: Diagnosis present

## 2013-04-12 DIAGNOSIS — N179 Acute kidney failure, unspecified: Secondary | ICD-10-CM

## 2013-04-12 DIAGNOSIS — N170 Acute kidney failure with tubular necrosis: Secondary | ICD-10-CM | POA: Diagnosis not present

## 2013-04-12 DIAGNOSIS — R791 Abnormal coagulation profile: Secondary | ICD-10-CM | POA: Diagnosis not present

## 2013-04-12 DIAGNOSIS — I509 Heart failure, unspecified: Secondary | ICD-10-CM | POA: Diagnosis present

## 2013-04-12 DIAGNOSIS — E785 Hyperlipidemia, unspecified: Secondary | ICD-10-CM | POA: Diagnosis present

## 2013-04-12 DIAGNOSIS — K7200 Acute and subacute hepatic failure without coma: Secondary | ICD-10-CM | POA: Diagnosis present

## 2013-04-12 DIAGNOSIS — J9600 Acute respiratory failure, unspecified whether with hypoxia or hypercapnia: Secondary | ICD-10-CM

## 2013-04-12 DIAGNOSIS — Z87891 Personal history of nicotine dependence: Secondary | ICD-10-CM

## 2013-04-12 DIAGNOSIS — I10 Essential (primary) hypertension: Secondary | ICD-10-CM | POA: Diagnosis present

## 2013-04-12 DIAGNOSIS — R7401 Elevation of levels of liver transaminase levels: Secondary | ICD-10-CM | POA: Diagnosis present

## 2013-04-12 DIAGNOSIS — Z66 Do not resuscitate: Secondary | ICD-10-CM | POA: Diagnosis not present

## 2013-04-12 DIAGNOSIS — F10939 Alcohol use, unspecified with withdrawal, unspecified: Secondary | ICD-10-CM | POA: Diagnosis not present

## 2013-04-12 DIAGNOSIS — Z79899 Other long term (current) drug therapy: Secondary | ICD-10-CM

## 2013-04-12 DIAGNOSIS — J80 Acute respiratory distress syndrome: Secondary | ICD-10-CM

## 2013-04-12 DIAGNOSIS — A419 Sepsis, unspecified organism: Secondary | ICD-10-CM | POA: Diagnosis not present

## 2013-04-12 DIAGNOSIS — Z7982 Long term (current) use of aspirin: Secondary | ICD-10-CM

## 2013-04-12 DIAGNOSIS — Z77090 Contact with and (suspected) exposure to asbestos: Secondary | ICD-10-CM

## 2013-04-12 DIAGNOSIS — I050 Rheumatic mitral stenosis: Secondary | ICD-10-CM | POA: Diagnosis present

## 2013-04-12 DIAGNOSIS — I2510 Atherosclerotic heart disease of native coronary artery without angina pectoris: Secondary | ICD-10-CM | POA: Diagnosis present

## 2013-04-12 DIAGNOSIS — E870 Hyperosmolality and hypernatremia: Secondary | ICD-10-CM | POA: Diagnosis not present

## 2013-04-12 DIAGNOSIS — F10239 Alcohol dependence with withdrawal, unspecified: Secondary | ICD-10-CM | POA: Diagnosis not present

## 2013-04-12 DIAGNOSIS — R7309 Other abnormal glucose: Secondary | ICD-10-CM | POA: Diagnosis not present

## 2013-04-12 DIAGNOSIS — F1010 Alcohol abuse, uncomplicated: Secondary | ICD-10-CM | POA: Diagnosis present

## 2013-04-12 HISTORY — DX: Sleep apnea, unspecified: G47.30

## 2013-04-12 HISTORY — DX: Personal history of other medical treatment: Z92.89

## 2013-04-12 HISTORY — DX: Pneumonia, unspecified organism: J18.9

## 2013-04-12 HISTORY — DX: Basal cell carcinoma of skin, unspecified: C44.91

## 2013-04-12 HISTORY — DX: Shortness of breath: R06.02

## 2013-04-12 LAB — BASIC METABOLIC PANEL
BUN: 40 mg/dL — ABNORMAL HIGH (ref 6–23)
CHLORIDE: 102 meq/L (ref 96–112)
CO2: 21 meq/L (ref 19–32)
Calcium: 8.5 mg/dL (ref 8.4–10.5)
Creatinine, Ser: 1.96 mg/dL — ABNORMAL HIGH (ref 0.50–1.35)
GFR calc Af Amer: 39 mL/min — ABNORMAL LOW (ref >90)
GFR calc non Af Amer: 34 mL/min — ABNORMAL LOW (ref >90)
GLUCOSE: 132 mg/dL — AB (ref 70–99)
POTASSIUM: 4.9 meq/L (ref 3.7–5.3)
SODIUM: 140 meq/L (ref 137–147)

## 2013-04-12 LAB — CBC
HCT: 38.8 % — ABNORMAL LOW (ref 39.0–52.0)
HEMOGLOBIN: 13.2 g/dL (ref 13.0–17.0)
MCH: 32.5 pg (ref 26.0–34.0)
MCHC: 34 g/dL (ref 30.0–36.0)
MCV: 95.6 fL (ref 78.0–100.0)
Platelets: 255 10*3/uL (ref 150–400)
RBC: 4.06 MIL/uL — AB (ref 4.22–5.81)
RDW: 14.6 % (ref 11.5–15.5)
WBC: 17.6 10*3/uL — AB (ref 4.0–10.5)

## 2013-04-12 LAB — HEPATIC FUNCTION PANEL
ALK PHOS: 93 U/L (ref 39–117)
ALT: 61 U/L — ABNORMAL HIGH (ref 0–53)
AST: 84 U/L — ABNORMAL HIGH (ref 0–37)
Albumin: 2.5 g/dL — ABNORMAL LOW (ref 3.5–5.2)
BILIRUBIN DIRECT: 0.3 mg/dL (ref 0.0–0.3)
BILIRUBIN TOTAL: 0.9 mg/dL (ref 0.3–1.2)
Indirect Bilirubin: 0.6 mg/dL (ref 0.3–0.9)
Total Protein: 7 g/dL (ref 6.0–8.3)

## 2013-04-12 LAB — URINE MICROSCOPIC-ADD ON

## 2013-04-12 LAB — URINALYSIS, ROUTINE W REFLEX MICROSCOPIC
Glucose, UA: NEGATIVE mg/dL
HGB URINE DIPSTICK: NEGATIVE
KETONES UR: NEGATIVE mg/dL
Nitrite: NEGATIVE
PROTEIN: NEGATIVE mg/dL
Specific Gravity, Urine: 1.021 (ref 1.005–1.030)
UROBILINOGEN UA: 1 mg/dL (ref 0.0–1.0)
pH: 5 (ref 5.0–8.0)

## 2013-04-12 LAB — POCT I-STAT TROPONIN I: TROPONIN I, POC: 0 ng/mL (ref 0.00–0.08)

## 2013-04-12 LAB — ETHANOL: Alcohol, Ethyl (B): <11 mg/dL (ref 0–11)

## 2013-04-12 LAB — PROTIME-INR
INR: 1.54 — ABNORMAL HIGH (ref 0.00–1.49)
PROTHROMBIN TIME: 18.1 s — AB (ref 11.6–15.2)

## 2013-04-12 LAB — TROPONIN I: Troponin I: <0.3 ng/mL (ref <0.30)

## 2013-04-12 LAB — CG4 I-STAT (LACTIC ACID): LACTIC ACID, VENOUS: 2.22 mmol/L — AB (ref 0.5–2.2)

## 2013-04-12 LAB — PRO B NATRIURETIC PEPTIDE: Pro B Natriuretic peptide (BNP): 4075 pg/mL — ABNORMAL HIGH (ref 0–125)

## 2013-04-12 MED ORDER — ATORVASTATIN CALCIUM 20 MG PO TABS
20.0000 mg | ORAL_TABLET | Freq: Every day | ORAL | Status: DC
  Filled 2013-04-12: qty 1

## 2013-04-12 MED ORDER — DEXTROSE 5 % IV SOLN
1.0000 g | Freq: Once | INTRAVENOUS | Status: AC
  Administered 2013-04-12: 1 g via INTRAVENOUS
  Filled 2013-04-12: qty 10

## 2013-04-12 MED ORDER — SODIUM CHLORIDE 0.9 % IJ SOLN: 3.0000 mL | INTRAMUSCULAR | Status: DC | PRN

## 2013-04-12 MED ORDER — ASPIRIN EC 81 MG PO TBEC
81.0000 mg | DELAYED_RELEASE_TABLET | Freq: Every day | ORAL | Status: DC
  Administered 2013-04-12: 81 mg via ORAL
  Filled 2013-04-12 (×3): qty 1

## 2013-04-12 MED ORDER — INFLUENZA VAC SPLIT QUAD 0.5 ML IM SUSP
0.5000 mL | INTRAMUSCULAR | Status: DC
  Filled 2013-04-12 (×2): qty 0.5

## 2013-04-12 MED ORDER — FUROSEMIDE 40 MG PO TABS
40.0000 mg | ORAL_TABLET | Freq: Every day | ORAL | Status: DC
  Filled 2013-04-12: qty 1

## 2013-04-12 MED ORDER — WARFARIN SODIUM 2.5 MG PO TABS
2.5000 mg | ORAL_TABLET | Freq: Once | ORAL | Status: AC
  Administered 2013-04-12: 2.5 mg via ORAL
  Filled 2013-04-12: qty 1

## 2013-04-12 MED ORDER — SODIUM CHLORIDE 0.9 % IV SOLN: INTRAVENOUS | Status: DC

## 2013-04-12 MED ORDER — AMIODARONE HCL 200 MG PO TABS: 200.0000 mg | ORAL_TABLET | ORAL | Status: DC

## 2013-04-12 MED ORDER — AMIODARONE HCL 200 MG PO TABS
400.0000 mg | ORAL_TABLET | ORAL | Status: DC
  Administered 2013-04-13: 10:00:00 400 mg via ORAL
  Filled 2013-04-12: qty 2

## 2013-04-12 MED ORDER — SODIUM CHLORIDE 0.9 % IV BOLUS (SEPSIS)
500.0000 mL | Freq: Once | INTRAVENOUS | Status: AC
  Administered 2013-04-12: 500 mL via INTRAVENOUS

## 2013-04-12 MED ORDER — AZITHROMYCIN 500 MG PO TABS
500.0000 mg | ORAL_TABLET | ORAL | Status: DC
  Administered 2013-04-12: 500 mg via ORAL
  Filled 2013-04-12 (×2): qty 1

## 2013-04-12 MED ORDER — SODIUM CHLORIDE 0.9 % IJ SOLN
3.0000 mL | Freq: Two times a day (BID) | INTRAMUSCULAR | Status: DC
  Administered 2013-04-12 – 2013-04-29 (×31): 3 mL via INTRAVENOUS

## 2013-04-12 MED ORDER — DEXTROSE 5 % IV SOLN: 500.0000 mg | Freq: Once | INTRAVENOUS | Status: DC

## 2013-04-12 MED ORDER — POTASSIUM CHLORIDE CRYS ER 10 MEQ PO TBCR
10.0000 meq | EXTENDED_RELEASE_TABLET | Freq: Every day | ORAL | Status: DC
  Filled 2013-04-12: qty 1

## 2013-04-12 MED ORDER — PNEUMOCOCCAL VAC POLYVALENT 25 MCG/0.5ML IJ INJ
0.5000 mL | INJECTION | INTRAMUSCULAR | Status: DC
  Filled 2013-04-12 (×2): qty 0.5

## 2013-04-12 MED ORDER — DILTIAZEM HCL ER 180 MG PO CP24
180.0000 mg | ORAL_CAPSULE | Freq: Every day | ORAL | Status: DC
  Administered 2013-04-13: 180 mg via ORAL
  Filled 2013-04-12 (×2): qty 1

## 2013-04-12 MED ORDER — DIGOXIN 125 MCG PO TABS
125.0000 ug | ORAL_TABLET | Freq: Every day | ORAL | Status: DC
  Administered 2013-04-13 – 2013-04-16 (×4): 125 ug via ORAL
  Filled 2013-04-12 (×5): qty 1

## 2013-04-12 MED ORDER — SODIUM CHLORIDE 0.9 % IV SOLN: 250.0000 mL | INTRAVENOUS | Status: DC | PRN

## 2013-04-12 MED ORDER — WARFARIN - PHARMACIST DOSING INPATIENT: Freq: Every day | Status: DC

## 2013-04-12 MED ORDER — CEFTRIAXONE SODIUM 1 G IJ SOLR
1.0000 g | INTRAMUSCULAR | Status: DC
  Administered 2013-04-13: 1 g via INTRAVENOUS
  Filled 2013-04-12 (×2): qty 10

## 2013-04-12 NOTE — Progress Notes (Signed)
ANTICOAGULATION CONSULT NOTE - Initial Consult  Pharmacy Consult for Coumadin Indication: atrial fibrillation  No Known Allergies  Patient Measurements: Height: 6\' 4"  (193 cm) Weight: 202 lb 14.4 oz (92.035 kg) IBW/kg (Calculated) : 86.8 Heparin Dosing Weight:   Vital Signs: Temp: 98.7 F (37.1 C) (01/15 2115) Temp src: Oral (01/15 2115) BP: 118/41 mmHg (01/15 2115) Pulse Rate: 59 (01/15 2115)  Labs:  Recent Labs  04/22/2013 1724 04/23/2013 1840 04/22/2013 1848  HGB 13.2  --   --   HCT 38.8*  --   --   PLT 255  --   --   LABPROT  --   --  18.1*  INR  --   --  1.54*  CREATININE 1.96*  --   --   TROPONINI  --  <0.30  --     Estimated Creatinine Clearance: 44.9 ml/min (by C-G formula based on Cr of 1.96).   Medical History: Past Medical History  Diagnosis Date  . 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 Last echo 03/2007 with mild AS, mild-mod AR, mild-mod MR, mild MS   . Asbestos exposure   . Peripheral vascular disease     a) reported hx of infrainguinal arterial occlusive disease. b) Bilateral CEA  in 2001 with RCEA operation complicated by neck hematoma and l forearm hematoma/compartment syndrome s/p fasciotomy  . Hyperlipidemia   . CAD (coronary artery disease)      followed by dr Bettina Gavia.last seen 03/2011  . HTN (hypertension)     followed by dr Lelon Frohlich  . Permanent atrial fibrillation     With LBBB pattern. On Chronic Coumadin  . Shortness of breath   . Heart murmur   . Cancer     HX BASAL CELL SKIN CA  . Blood transfusion without reported diagnosis   . CHF (congestive heart failure)     Medications:  Scheduled:  . [START ON 04/14/2013] amiodarone  200 mg Oral Custom  . [START ON 04/13/2013] amiodarone  400 mg Oral Custom  . aspirin EC  81 mg Oral QHS  . [START ON 04/13/2013] atorvastatin  20 mg  Oral q1800  . azithromycin  500 mg Oral Q24H  . [START ON 04/13/2013] cefTRIAXone (ROCEPHIN)  IV  1 g Intravenous Q24H  . [START ON 04/13/2013] digoxin  125 mcg Oral Daily  . [START ON 04/13/2013] diltiazem  180 mg Oral Daily  . [START ON 04/13/2013] furosemide  40 mg Oral Daily  . [START ON 04/13/2013] influenza vac split quadrivalent PF  0.5 mL Intramuscular Tomorrow-1000  . [START ON 04/13/2013] pneumococcal 23 valent vaccine  0.5 mL Intramuscular Tomorrow-1000  . [START ON 04/13/2013] potassium chloride  10 mEq Oral Daily  . sodium chloride  3 mL Intravenous Q12H    Assessment: Pt was seen at an urgent care on Tues where he was diagnosed with CAP and given Levaquin. He has not improved so he came to the ED for further treatment. The pt in now being treated with Rocephin and Azithromycin. He also suffers from HTN, HLD, systolic CHF. He takes Coumadin, amiodarone, digoxin and diltiazem to control his a.fib. Pharmacy is to dose his coumadin while in the hospital. His INR is 1.54. He alternates his coumadin doses 5mg , 2.5 mg , 5 mg , 2.5 mg , etc. He has taken 5 mg today. Goal of Therapy:  INR 2-3 Monitor platelets by anticoagulation protocol: Yes   Plan:  Pt has taken his usual 5 mg of coumadin today. However, because his INR is subtherapeutic at 1.54, another 2.5 mg has been ordered for him. Daily INR  Minta Balsam 04/18/2013,9:55 PM

## 2013-04-12 NOTE — ED Notes (Addendum)
Pt reports going to [facility] on 1/12 and was diagnosed with pneumonia, has been taking antibiotics as prescribed but reports no relief. Still having productive cough, fever and sob. bp 92/49 at triage. Hx of chf, denies any swelling to extremities.

## 2013-04-12 NOTE — ED Notes (Signed)
Pt report to [facility] on 3 E.  To floor with EDT on monitor.

## 2013-04-12 NOTE — Progress Notes (Signed)
Unit CM UR Completed by [facility] ED CM  W. Tristian Bouska RN  

## 2013-04-12 NOTE — H&P (Signed)
[facility] History and Physical  Connor Ford GHW:[id number] DOB: 23-Feb-1946 DOA: 04/21/13  Referring physician: ER physician PCP: Enid Skeens., MD   Chief Complaint: shortness of breath   HPI:  Pt is 68 yo male with complex medical history including atrial fibrillation on Coumadin and amiodarone, digoxin and Diltiazem, HTN (on Ramipril), HLD, systolic CHF with EF 40 % per last 2 D ECHO in 2013, now presenting to [facility] ED with main concern of several days duration of progressively worsening shortness of breath that initially started with exertion and now present at rest. Pt explains he has seen his PCP and was diagnosed with PNA, and was started on Levaquin but his symptoms have not improved. He now has fevers, chills, malaise, poor oral intake. Pt denies similar events in the past, denies known sick contacts or exposures, no specific alleviating or aggravating factors, no specific abdominal or urinary concerns.   In ED, pt found to have oxygen saturation 90% on RA, coughing and with Tmax 101 F. CXR worrisome for PNA and vascular congestion. Pt started on Zithromax and Rocephin. TRH asked to admit for further evaluation. Pt also given total of 500 cc IVF NS.  Principal Problem:   Acute respiratory failure - this is most likely secondary to PNA and possible developing vascular congestion - will admit to telemetry bed - place on empiric ABX Zithromax and Rocephin - obtain sputum analysis, culture, gram stain, urine legionella and strep pneumo - order influenza panel - also place on home dose Lasix and stop IVF - 2 D ECHO ordered  Active Problems:   CAP (community acquired pneumonia) - management with ABX as noted above    Systolic CHF - last 2 D ECHO in 2013, EF 40% - will repeat 2D ECHO - place on Lasix as per home medical regimen as noted above, 40 mg PO QD  - daily weights, I's and O's - weight on admission 205 lbs   Atrial fibrillation  - continue Amiodarone and  Digoxin  - Coumadin per pharmacy    Acute renal failure - hold Ramipril due to ARF - continue Lasix as noted above with close monitoring of renal function - BMP in AM   Leukocytosis - secondary to CAP, ABX as noted above, CBC in AM   Transaminitis - unclear etiology, will check alcohol level  - repeat CMET in AM   HYPERLIPIDEMIA - continue statin   HYPERTENSION - hold Ramipril but continue Lasix for CHF   Radiological Exams on Admission: Dg Chest 2 View   04-21-13   Marked worsening in right worse than left airspace disease consistent with pneumonia and/or asymmetric edema.     EKG: Normal sinus rhythm, no ST/T wave changes  Code Status: Full Family Communication: Pt at bedside Disposition Plan: Admit for further evaluation  Leisa Lenz, MD  [facility] Pager [phone]  Review of Systems:  Constitutional: Negative for diaphoresis.  HENT: Negative for hearing loss, ear pain, nosebleeds, congestion, sore throat, neck pain, tinnitus and ear discharge.   Eyes: Negative for blurred vision, double vision, photophobia, pain, discharge and redness.  Respiratory: Negative for wheezing and stridor.   Cardiovascular: Negative for chest pain, palpitations, orthopnea, claudication and leg swelling.  Gastrointestinal: Negative for heartburn, constipation, blood in stool and melena.  Genitourinary: Negative for dysuria, urgency, frequency, hematuria and flank pain.  Musculoskeletal: Negative for myalgias, back pain, joint pain and falls.  Skin: Negative for itching and rash.  Neurological: Negative for tingling, tremors, sensory change, speech  change, focal weakness, loss of consciousness and headaches.  Endo/Heme/Allergies: Negative for environmental allergies and polydipsia. Does not bruise/bleed easily.  Psychiatric/Behavioral: Negative for suicidal ideas. The patient is not nervous/anxious.      Past Medical History  Diagnosis Date  . Valvular heart disease     Idiopathic  hypertrophic subaortic stenosis & mitral regurgitation s/p MV repair (66mm St Jude posterior annuloplasty) and septal myomectomy in 02/1999 at time of CABG. Last echo 03/2007 with mild AS, mild-mod AR, mild-mod MR, mild MS   . Asbestos exposure   . Peripheral vascular disease     a) reported hx of infrainguinal arterial occlusive disease. b) Bilateral CEA  in 2001 with RCEA operation complicated by neck hematoma and l forearm hematoma/compartment syndrome s/p fasciotomy  . Hyperlipidemia   . CAD (coronary artery disease)      followed by dr Bettina Gavia.last seen 03/2011  . HTN (hypertension)     followed by dr Lelon Frohlich  . Permanent atrial fibrillation     With LBBB pattern. On Chronic Coumadin  . Shortness of breath   . Heart murmur   . Cancer     HX BASAL CELL SKIN CA  . Blood transfusion without reported diagnosis   . CHF (congestive heart failure)    Past Surgical History  Procedure Laterality Date  . Carotid endarterectomy      R&L  . Fasciotomy      for compartment syndrome of L forearm following RCEA  . Coronary artery bypass graft    . Mitral valve repair    . Fasciotomy  07/14/2011    Procedure: FASCIOTOMY;  Surgeon: Newt Minion, MD;  Location: [place];  Service: Orthopedics;  Laterality: Left;  . I&d extremity  07/16/2011    Procedure: IRRIGATION AND DEBRIDEMENT EXTREMITY;  Surgeon: Newt Minion, MD;  Location: [place];  Service: Orthopedics;  Laterality: Left;  Irrigation and Debridement Left Forearm, apply Acell and Wound VAC  . I&d extremity  08/06/2011    Procedure: IRRIGATION AND DEBRIDEMENT EXTREMITY;  Surgeon: Newt Minion, MD;  Location: [place];  Service: Orthopedics;  Laterality: Left;  Irrigation and Debridement/Wound Closure Left Forearm  . Leg surgery      2009  . Lung surgery      LEFT RUPTURED LESION  2008 [facility]  . Skin split graft  09/08/2011    Procedure: SKIN GRAFT SPLIT THICKNESS;  Surgeon: Newt Minion, MD;  Location: [place];  Service: Orthopedics;   Laterality: Left;  Split Thickness Skin Graft from Left Thigh to Left Forearm   . Eye surgery    . Vasectomy     Social History:  reports that he quit smoking about 14 years ago. His smoking use included Cigarettes. He has a 60 pack-year smoking history. He quit smokeless tobacco use about 14 years ago. His smokeless tobacco use included Snuff. He reports that he drinks about 8.4 ounces of alcohol per week. He reports that he does not use illicit drugs.  No Known Allergies  Family History: no history of cancers, no cardiovascular diseases on mother or father side  Medication Sig  amiodarone 200 MG tablet Take 200-400 mg by mouth  aspirin EC 81 MG tablet Take 81 mg by mouth at bedtime.   atorvastatin (LIPITOR) 20 MG tablet Take 20 mg by mouth daily at 6 PM.   digoxin (LANOXIN) 0.125 MG tablet Take 125 mcg by mouth daily.  diltiazem 180 MG 24 hr capsule Take 180 mg by  mouth daily.  furosemide (LASIX) 40 MG tablet Take 40 mg by mouth daily.  potassium chloride  10 MEQ tablet Take 10 mEq by mouth daily.  ramipril (ALTACE) 5 MG capsule Take 5 mg by mouth daily.  warfarin (COUMADIN) 5 MG tablet Take 2.5-5 mg by mouth    Physical Exam: Filed Vitals:   04/26/2013 1715 04/15/2013 1815 04/16/2013 1823  BP: 92/49 116/70   Pulse: 79 76   Temp: 99.2 F (37.3 C)    TempSrc: Oral    Resp: 16    SpO2: 98% 89% 91%    Physical Exam  Constitutional: Appears well-developed and well-nourished. No distress.  HENT: Normocephalic. External right and left ear normal. Oropharynx is clear and moist.  Eyes: Conjunctivae and EOM are normal. PERRLA, no scleral icterus.  Neck: Normal ROM. Neck supple. No JVD. No tracheal deviation. No thyromegaly.  CVS: Irregular rate and rhythm, no gallops, no carotid bruit.  Pulmonary: Bilateral scattered rhonchi and bibasilar crackles  Abdominal: Soft. BS +,  no distension, tenderness, rebound or guarding.  Musculoskeletal: Normal range of motion. +1 bilateral pitting  edema Lymphadenopathy: No lymphadenopathy noted, cervical, inguinal. Neuro: Alert. Normal reflexes, muscle tone coordination. No cranial nerve deficit. Skin: Skin is warm and dry. No rash noted. Not diaphoretic. No erythema. No pallor.  Psychiatric: Normal mood and affect. Behavior, judgment, thought content normal.   Labs on Admission:  Basic Metabolic Panel:  Recent Labs Lab 04/19/2013 1724  NA 140  K 4.9  CL 102  CO2 21  GLUCOSE 132*  BUN 40*  CREATININE 1.96*  CALCIUM 8.5   Liver Function Tests:  Recent Labs Lab 04/04/2013 1848  AST 84*  ALT 61*  ALKPHOS 93  BILITOT 0.9  PROT 7.0  ALBUMIN 2.5*   CBC:  Recent Labs Lab 04/11/2013 1724  WBC 17.6*  HGB 13.2  HCT 38.8*  MCV 95.6  PLT 255   Cardiac Enzymes:  Recent Labs Lab 04/13/2013 1840  TROPONINI <0.30   If 7PM-7AM, please contact night-coverage [email or web address] Password [facility] 04/16/2013, 7:52 PM

## 2013-04-12 NOTE — ED Notes (Signed)
Family at bedside. 

## 2013-04-12 NOTE — ED Provider Notes (Signed)
CSN: [id number]     Arrival date & time 04/01/2013  1632 History   First MD Initiated Contact with Patient 04/09/2013 1802     No chief complaint on file.  (Consider location/radiation/quality/duration/timing/severity/associated sxs/prior Treatment) The history is provided by the patient and the spouse.   68 year old male. Patient primary care Dr. is in the [place] area. Patient diagnosed on Tuesday with pneumonia he has gotten worse since then. Patient has been taking Levaquin without any improvement. Patient does not use oxygen at home. Here patient was hypoxic with room air sats below 90%. Patient had an upper respiratory infection in December got over that this illness just started on Monday. Patient denies any nausea vomiting sore throat.  Past Medical History  Diagnosis Date  . 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 Last echo 03/2007 with mild AS, mild-mod AR, mild-mod MR, mild MS   . Asbestos exposure   . Peripheral vascular disease     a) reported hx of infrainguinal arterial occlusive disease. b) Bilateral CEA  in 2001 with RCEA operation complicated by neck hematoma and l forearm hematoma/compartment syndrome s/p fasciotomy  . Hyperlipidemia   . CAD (coronary artery disease)      followed by dr Bettina Gavia.last seen 03/2011  . HTN (hypertension)     followed by dr Lelon Frohlich  . Permanent atrial fibrillation     With LBBB pattern. On Chronic Coumadin  . Shortness of breath   . Heart murmur   . Cancer     HX BASAL CELL SKIN CA  . Blood transfusion without reported diagnosis   . CHF (congestive heart failure)    Past Surgical History  Procedure Laterality Date  . Carotid endarterectomy      R&L  . Fasciotomy      for compartment syndrome of L forearm following RCEA  . Coronary artery bypass graft    . Mitral valve repair     . Fasciotomy  07/14/2011    Procedure: FASCIOTOMY;  Surgeon: Newt Minion, MD;  Location: [place];  Service: Orthopedics;  Laterality: Left;  . I&d extremity  07/16/2011    Procedure: IRRIGATION AND DEBRIDEMENT EXTREMITY;  Surgeon: Newt Minion, MD;  Location: [place];  Service: Orthopedics;  Laterality: Left;  Irrigation and Debridement Left Forearm, apply Acell and Wound VAC  . I&d extremity  08/06/2011    Procedure: IRRIGATION AND DEBRIDEMENT EXTREMITY;  Surgeon: Newt Minion, MD;  Location: [place];  Service: Orthopedics;  Laterality: Left;  Irrigation and Debridement/Wound Closure Left Forearm  . Leg surgery      2009  . Lung surgery      LEFT RUPTURED LESION  2008 [facility]  . Skin split graft  09/08/2011    Procedure: SKIN GRAFT SPLIT THICKNESS;  Surgeon: Newt Minion, MD;  Location: [place];  Service: Orthopedics;  Laterality: Left;  Split Thickness Skin Graft from Left Thigh to Left Forearm   . Eye surgery    . Vasectomy     Family History  Problem Relation Age of Onset  . Stroke Mother     Died at 70 of a stroke  . Lung disease Brother     Brother died of black lung disease   History  Substance Use Topics  . Smoking status: Former Smoker -- 2.00 packs/day for 30 years  Types: Cigarettes    Quit date: 02/27/1999  . Smokeless tobacco: Former Systems developer    Types: Snuff    Quit date: 02/27/1999  . Alcohol Use: 8.4 oz/week    14 Cans of beer per week     Comment: Drinks 2 12-oz beers nightly    Review of Systems  Constitutional: Positive for fever and fatigue.  HENT: Negative for congestion.   Eyes: Negative for redness.  Respiratory: Positive for cough.   Cardiovascular: Negative for chest pain.  Gastrointestinal: Negative for nausea, vomiting and abdominal pain.  Genitourinary: Negative for dysuria.  Musculoskeletal: Positive for myalgias.  Skin: Negative for rash.  Neurological: Negative for headaches.  Hematological: Does not bruise/bleed easily.   Psychiatric/Behavioral: Negative for confusion.    Allergies  Review of patient's allergies indicates no known allergies.  Home Medications   Current Outpatient Rx  Name  Route  Sig  Dispense  Refill  . amiodarone (PACERONE) 200 MG tablet   Oral   Take 200-400 mg by mouth See admin instructions. Takes 400mg  on M,W,F each week All other days takes 200mg          . aspirin EC 81 MG tablet   Oral   Take 81 mg by mouth at bedtime.          Marland Kitchen atorvastatin (LIPITOR) 20 MG tablet   Oral   Take 20 mg by mouth daily at 6 PM.          . digoxin (LANOXIN) 0.125 MG tablet   Oral   Take 125 mcg by mouth daily.         Marland Kitchen diltiazem (DILACOR XR) 180 MG 24 hr capsule   Oral   Take 180 mg by mouth daily.         . furosemide (LASIX) 40 MG tablet   Oral   Take 40 mg by mouth daily.         Marland Kitchen levofloxacin (LEVAQUIN) 500 MG tablet   Oral   Take 1 tablet (500 mg total) by mouth daily.   7 tablet   0   . PE-DM-APAP & Doxylamin-DM-APAP (VICKS DAYQUIL/NYQUIL CLD & FLU) (LIQUID) MISC   Oral   Take 2 capsules by mouth 2 (two) times daily as needed (for cold).         . potassium chloride (K-DUR,KLOR-CON) 10 MEQ tablet   Oral   Take 10 mEq by mouth daily.         . ramipril (ALTACE) 5 MG capsule   Oral   Take 5 mg by mouth daily.         Marland Kitchen VITAMIN E PO   Oral   Take 1 tablet by mouth daily.         Marland Kitchen warfarin (COUMADIN) 5 MG tablet   Oral   Take 2.5-5 mg by mouth See admin instructions. Alternating dosage 2.5mg , 5mg , 2.5mg , etc          BP 116/70  Pulse 76  Temp(Src) 99.2 F (37.3 C) (Oral)  Resp 16  SpO2 91% Physical Exam  Nursing note and vitals reviewed. Constitutional: He is oriented to person, place, and time. He appears well-developed and well-nourished. No distress.  HENT:  Head: Normocephalic and atraumatic.  Mouth/Throat: Oropharynx is clear and moist.  Eyes: Conjunctivae and EOM are normal. Pupils are equal, round, and reactive to light.   Neck: Normal range of motion.  Cardiovascular: Normal rate, regular rhythm and normal heart sounds.   No murmur heard. Pulmonary/Chest: Effort  normal. No respiratory distress.  Abdominal: Soft. Bowel sounds are normal. There is no tenderness.  Musculoskeletal: Normal range of motion. He exhibits no edema.  Neurological: He is alert and oriented to person, place, and time. No cranial nerve deficit. He exhibits normal muscle tone. Coordination normal.  Skin: Skin is warm. No rash noted.    ED Course  Procedures (including critical care time) Labs Review Labs Reviewed  CBC - Abnormal; Notable for the following:    WBC 17.6 (*)    RBC 4.06 (*)    HCT 38.8 (*)    All other components within normal limits  BASIC METABOLIC PANEL - Abnormal; Notable for the following:    Glucose, Bld 132 (*)    BUN 40 (*)    Creatinine, Ser 1.96 (*)    GFR calc non Af Amer 34 (*)    GFR calc Af Amer 39 (*)    All other components within normal limits  PRO B NATRIURETIC PEPTIDE - Abnormal; Notable for the following:    Pro B Natriuretic peptide (BNP) 4075.0 (*)    All other components within normal limits  PROTIME-INR - Abnormal; Notable for the following:    Prothrombin Time 18.1 (*)    INR 1.54 (*)    All other components within normal limits  CG4 I-STAT (LACTIC ACID) - Abnormal; Notable for the following:    Lactic Acid, Venous 2.22 (*)    All other components within normal limits  CULTURE, BLOOD (ROUTINE X 2)  CULTURE, BLOOD (ROUTINE X 2)  URINALYSIS, ROUTINE W REFLEX MICROSCOPIC  HEPATIC FUNCTION PANEL  TROPONIN I  POCT I-STAT TROPONIN I   Results for orders placed during the hospital encounter of 04/17/2013  CBC      Result Value Range   WBC 17.6 (*) 4.0 - 10.5 K/uL   RBC 4.06 (*) 4.22 - 5.81 MIL/uL   Hemoglobin 13.2  13.0 - 17.0 g/dL   HCT 38.8 (*) 39.0 - 52.0 %   MCV 95.6  78.0 - 100.0 fL   MCH 32.5  26.0 - 34.0 pg   MCHC 34.0  30.0 - 36.0 g/dL   RDW 14.6  11.5 - 15.5 %    Platelets 255  150 - 400 K/uL  BASIC METABOLIC PANEL      Result Value Range   Sodium 140  137 - 147 mEq/L   Potassium 4.9  3.7 - 5.3 mEq/L   Chloride 102  96 - 112 mEq/L   CO2 21  19 - 32 mEq/L   Glucose, Bld 132 (*) 70 - 99 mg/dL   BUN 40 (*) 6 - 23 mg/dL   Creatinine, Ser 1.96 (*) 0.50 - 1.35 mg/dL   Calcium 8.5  8.4 - 10.5 mg/dL   GFR calc non Af Amer 34 (*) >90 mL/min   GFR calc Af Amer 39 (*) >90 mL/min  PRO B NATRIURETIC PEPTIDE      Result Value Range   Pro B Natriuretic peptide (BNP) 4075.0 (*) 0 - 125 pg/mL  PROTIME-INR      Result Value Range   Prothrombin Time 18.1 (*) 11.6 - 15.2 seconds   INR 1.54 (*) 0.00 - 1.49  CG4 I-STAT (LACTIC ACID)      Result Value Range   Lactic Acid, Venous 2.22 (*) 0.5 - 2.2 mmol/L  POCT I-STAT TROPONIN I      Result Value Range   Troponin i, poc 0.00  0.00 - 0.08 ng/mL   Comment 3  Imaging Review Dg Chest 2 View  May 05, 2013   CLINICAL DATA:  Shortness of breath and cough. Recent diagnosis of pneumonia.  EXAM: CHEST  2 VIEW  COMPARISON:  PA and lateral chest 04/09/2013.  FINDINGS: Bilateral airspace disease, worse on the right, shows marked progression since the prior examination. There is marked cardiomegaly. No pneumothorax or pleural effusion is identified. No focal bony abnormality.  IMPRESSION: Marked worsening in right worse than left airspace disease consistent with pneumonia and/or asymmetric edema.   Electronically Signed   By: Inge Rise M.D.   On: May 05, 2013 19:09    EKG Interpretation    Date/Time:  2013-05-05 17:25:38 EST Ventricular Rate:  80 PR Interval:    QRS Duration: 152 QT Interval:  494 QTC Calculation: 569 R Axis:   119 Text Interpretation:  Sinus tachycardia with 2nd degree A-V block ([place] I) Right axis deviation Non-specific intra-ventricular conduction block Abnormal ECG Changes from 2013 Confirmed by Oyindamola Key  MD, [place] ([id number]) on 05-May-2013 6:46:11 PM             MDM   1. CAP (community acquired pneumonia)   2. Hypoxia    Patient with pneumonia clinically worsening pneumonia. Patient with hypoxia oxygen requirement on 4 L of oxygen oxygen sats are 92-95%. That's at rest if he exerts himself at all they will drop. Patient's lactic acid is elevated but it is less than 4. Patient not hypotensive bolus blood pressure we had was a systolic of 92 but most of it is above 100. Patient's symptoms are consistent with pneumonia. Patient will require admission. Patient started on community-acquired pneumonia antibiotics cultures were done. Patient has been on Levaquin the last few days.    Mervin Kung, MD 05-May-2013 1945

## 2013-04-12 NOTE — ED Notes (Signed)
Patient transported to X-ray 

## 2013-04-13 ENCOUNTER — Inpatient Hospital Stay (HOSPITAL_COMMUNITY): Payer: Medicare PPO

## 2013-04-13 DIAGNOSIS — R740 Nonspecific elevation of levels of transaminase and lactic acid dehydrogenase [LDH]: Secondary | ICD-10-CM

## 2013-04-13 DIAGNOSIS — J189 Pneumonia, unspecified organism: Secondary | ICD-10-CM

## 2013-04-13 DIAGNOSIS — R7402 Elevation of levels of lactic acid dehydrogenase (LDH): Secondary | ICD-10-CM

## 2013-04-13 DIAGNOSIS — I359 Nonrheumatic aortic valve disorder, unspecified: Secondary | ICD-10-CM

## 2013-04-13 DIAGNOSIS — R7401 Elevation of levels of liver transaminase levels: Secondary | ICD-10-CM

## 2013-04-13 DIAGNOSIS — J9600 Acute respiratory failure, unspecified whether with hypoxia or hypercapnia: Secondary | ICD-10-CM

## 2013-04-13 LAB — POCT I-STAT 3, ART BLOOD GAS (G3+)
Bicarbonate: 22.3 mEq/L (ref 20.0–24.0)
O2 Saturation: 89 %
PO2 ART: 51 mmHg — AB (ref 80.0–100.0)
Patient temperature: 98.7
TCO2: 23 mmol/L (ref 0–100)
pCO2 arterial: 29.5 mmHg — ABNORMAL LOW (ref 35.0–45.0)
pH, Arterial: 7.486 — ABNORMAL HIGH (ref 7.350–7.450)

## 2013-04-13 LAB — COMPREHENSIVE METABOLIC PANEL
ALBUMIN: 2.2 g/dL — AB (ref 3.5–5.2)
ALT: 71 U/L — AB (ref 0–53)
AST: 110 U/L — ABNORMAL HIGH (ref 0–37)
Alkaline Phosphatase: 100 U/L (ref 39–117)
BILIRUBIN TOTAL: 0.8 mg/dL (ref 0.3–1.2)
BUN: 38 mg/dL — ABNORMAL HIGH (ref 6–23)
CHLORIDE: 105 meq/L (ref 96–112)
CO2: 23 mEq/L (ref 19–32)
Calcium: 8.5 mg/dL (ref 8.4–10.5)
Creatinine, Ser: 1.6 mg/dL — ABNORMAL HIGH (ref 0.50–1.35)
GFR calc Af Amer: 50 mL/min — ABNORMAL LOW (ref >90)
GFR calc non Af Amer: 43 mL/min — ABNORMAL LOW (ref >90)
Glucose, Bld: 109 mg/dL — ABNORMAL HIGH (ref 70–99)
POTASSIUM: 5.3 meq/L (ref 3.7–5.3)
Sodium: 141 mEq/L (ref 137–147)
Total Protein: 6.3 g/dL (ref 6.0–8.3)

## 2013-04-13 LAB — RESPIRATORY VIRUS PANEL
ADENOVIRUS: NOT DETECTED
INFLUENZA A H3: NOT DETECTED
INFLUENZA A: NOT DETECTED
INFLUENZA B 1: NOT DETECTED
Influenza A H1: NOT DETECTED
Metapneumovirus: NOT DETECTED
Parainfluenza 1: NOT DETECTED
Parainfluenza 2: NOT DETECTED
Parainfluenza 3: NOT DETECTED
Respiratory Syncytial Virus A: NOT DETECTED
Respiratory Syncytial Virus B: NOT DETECTED
Rhinovirus: NOT DETECTED

## 2013-04-13 LAB — CBC
HEMATOCRIT: 37.1 % — AB (ref 39.0–52.0)
Hemoglobin: 12.4 g/dL — ABNORMAL LOW (ref 13.0–17.0)
MCH: 32 pg (ref 26.0–34.0)
MCHC: 33.4 g/dL (ref 30.0–36.0)
MCV: 95.6 fL (ref 78.0–100.0)
Platelets: 230 10*3/uL (ref 150–400)
RBC: 3.88 MIL/uL — ABNORMAL LOW (ref 4.22–5.81)
RDW: 14.7 % (ref 11.5–15.5)
WBC: 13.7 10*3/uL — AB (ref 4.0–10.5)

## 2013-04-13 LAB — LIPID PANEL
Cholesterol: 94 mg/dL (ref 0–200)
HDL: 17 mg/dL — AB (ref >39)
LDL CALC: 62 mg/dL (ref 0–99)
Total CHOL/HDL Ratio: 5.5 RATIO
Triglycerides: 73 mg/dL (ref <150)
VLDL: 15 mg/dL (ref 0–40)

## 2013-04-13 LAB — LEGIONELLA ANTIGEN, URINE: LEGIONELLA ANTIGEN, URINE: NEGATIVE

## 2013-04-13 LAB — HEMOGLOBIN A1C
Hgb A1c MFr Bld: 5.8 % — ABNORMAL HIGH (ref <5.7)
MEAN PLASMA GLUCOSE: 120 mg/dL — AB (ref <117)

## 2013-04-13 LAB — TROPONIN I: Troponin I: <0.3 ng/mL (ref <0.30)

## 2013-04-13 LAB — INFLUENZA PANEL BY PCR (TYPE A & B)
H1N1 flu by pcr: NOT DETECTED
INFLBPCR: NEGATIVE
Influenza A By PCR: NEGATIVE

## 2013-04-13 LAB — LACTIC ACID, PLASMA: Lactic Acid, Venous: 1.5 mmol/L (ref 0.5–2.2)

## 2013-04-13 LAB — DIGOXIN LEVEL: Digoxin Level: <0.3 ng/mL — ABNORMAL LOW (ref 0.8–2.0)

## 2013-04-13 LAB — PHOSPHORUS: Phosphorus: 4 mg/dL (ref 2.3–4.6)

## 2013-04-13 LAB — PROTIME-INR
INR: 1.52 — ABNORMAL HIGH (ref 0.00–1.49)
Prothrombin Time: 17.9 seconds — ABNORMAL HIGH (ref 11.6–15.2)

## 2013-04-13 LAB — GLUCOSE, CAPILLARY
GLUCOSE-CAPILLARY: 128 mg/dL — AB (ref 70–99)
Glucose-Capillary: 145 mg/dL — ABNORMAL HIGH (ref 70–99)

## 2013-04-13 LAB — MAGNESIUM: Magnesium: 2 mg/dL (ref 1.5–2.5)

## 2013-04-13 LAB — PRO B NATRIURETIC PEPTIDE: Pro B Natriuretic peptide (BNP): 2568 pg/mL — ABNORMAL HIGH (ref 0–125)

## 2013-04-13 LAB — HIV ANTIBODY (ROUTINE TESTING W REFLEX): HIV: NONREACTIVE

## 2013-04-13 LAB — TSH: TSH: 22.71 u[IU]/mL — AB (ref 0.350–4.500)

## 2013-04-13 LAB — STREP PNEUMONIAE URINARY ANTIGEN: Strep Pneumo Urinary Antigen: NEGATIVE

## 2013-04-13 MED ORDER — PROPOFOL 10 MG/ML IV EMUL
INTRAVENOUS | Status: AC
  Administered 2013-04-13: 20 mL
  Filled 2013-04-13: qty 100

## 2013-04-13 MED ORDER — FUROSEMIDE 10 MG/ML IJ SOLN
40.0000 mg | Freq: Two times a day (BID) | INTRAMUSCULAR | Status: AC
  Administered 2013-04-13 – 2013-04-15 (×4): 40 mg via INTRAVENOUS
  Filled 2013-04-13 (×4): qty 4

## 2013-04-13 MED ORDER — PROPOFOL 10 MG/ML IV EMUL
0.0000 ug/kg/min | INTRAVENOUS | Status: DC
  Administered 2013-04-13: 30 ug/kg/min via INTRAVENOUS
  Filled 2013-04-13: qty 100

## 2013-04-13 MED ORDER — HEPARIN SODIUM (PORCINE) 5000 UNIT/ML IJ SOLN
5000.0000 [IU] | Freq: Three times a day (TID) | INTRAMUSCULAR | Status: DC
  Administered 2013-04-13 – 2013-04-17 (×11): 5000 [IU] via SUBCUTANEOUS
  Filled 2013-04-13 (×14): qty 1

## 2013-04-13 MED ORDER — LEVOFLOXACIN IN D5W 750 MG/150ML IV SOLN
750.0000 mg | INTRAVENOUS | Status: DC
  Administered 2013-04-13: 750 mg via INTRAVENOUS
  Filled 2013-04-13: qty 150

## 2013-04-13 MED ORDER — VANCOMYCIN HCL IN DEXTROSE 750-5 MG/150ML-% IV SOLN
750.0000 mg | Freq: Two times a day (BID) | INTRAVENOUS | Status: DC
  Administered 2013-04-13 – 2013-04-15 (×4): 750 mg via INTRAVENOUS
  Filled 2013-04-13 (×6): qty 150

## 2013-04-13 MED ORDER — FENTANYL CITRATE 0.05 MG/ML IJ SOLN
INTRAMUSCULAR | Status: AC
  Administered 2013-04-13: 100 ug
  Filled 2013-04-13: qty 2

## 2013-04-13 MED ORDER — BIOTENE DRY MOUTH MT LIQD
15.0000 mL | Freq: Four times a day (QID) | OROMUCOSAL | Status: DC
  Administered 2013-04-14 – 2013-04-16 (×9): 15 mL via OROMUCOSAL

## 2013-04-13 MED ORDER — PIPERACILLIN-TAZOBACTAM 3.375 G IVPB
3.3750 g | Freq: Three times a day (TID) | INTRAVENOUS | Status: DC
  Administered 2013-04-14 – 2013-04-17 (×11): 3.375 g via INTRAVENOUS
  Filled 2013-04-13 (×13): qty 50

## 2013-04-13 MED ORDER — OSELTAMIVIR PHOSPHATE 30 MG PO CAPS
30.0000 mg | ORAL_CAPSULE | Freq: Two times a day (BID) | ORAL | Status: DC
  Administered 2013-04-13 – 2013-04-16 (×6): 30 mg via ORAL
  Filled 2013-04-13 (×7): qty 1

## 2013-04-13 MED ORDER — CHLORHEXIDINE GLUCONATE 0.12 % MT SOLN
15.0000 mL | Freq: Two times a day (BID) | OROMUCOSAL | Status: DC
  Administered 2013-04-14 – 2013-04-16 (×6): 15 mL via OROMUCOSAL
  Filled 2013-04-13 (×6): qty 15

## 2013-04-13 MED ORDER — WARFARIN SODIUM 5 MG PO TABS
5.0000 mg | ORAL_TABLET | Freq: Once | ORAL | Status: DC
  Filled 2013-04-13: qty 1

## 2013-04-13 MED ORDER — VECURONIUM BROMIDE 10 MG IV SOLR
1.0000 mg | Freq: Once | INTRAVENOUS | Status: AC
  Administered 2013-04-13: 1 mg via INTRAVENOUS
  Filled 2013-04-13: qty 10

## 2013-04-13 MED ORDER — FUROSEMIDE 40 MG PO TABS: 40.0000 mg | ORAL_TABLET | Freq: Every day | ORAL | Status: DC

## 2013-04-13 MED ORDER — LEVALBUTEROL HCL 0.63 MG/3ML IN NEBU
0.6300 mg | INHALATION_SOLUTION | Freq: Four times a day (QID) | RESPIRATORY_TRACT | Status: DC | PRN
Start: 2013-04-13 — End: 2013-04-30
  Administered 2013-04-13 – 2013-04-29 (×2): 0.63 mg via RESPIRATORY_TRACT
  Filled 2013-04-13: qty 3

## 2013-04-13 MED ORDER — GUAIFENESIN ER 600 MG PO TB12
600.0000 mg | ORAL_TABLET | Freq: Two times a day (BID) | ORAL | Status: DC
  Filled 2013-04-13 (×4): qty 1

## 2013-04-13 MED ORDER — PANTOPRAZOLE SODIUM 40 MG IV SOLR
40.0000 mg | Freq: Every day | INTRAVENOUS | Status: DC
  Administered 2013-04-14 – 2013-04-16 (×4): 40 mg via INTRAVENOUS
  Filled 2013-04-13 (×4): qty 40

## 2013-04-13 MED ORDER — SODIUM CHLORIDE 0.9 % IV SOLN
20.0000 ug/h | INTRAVENOUS | Status: DC
  Administered 2013-04-14: 20 ug/h via INTRAVENOUS
  Filled 2013-04-13: qty 50

## 2013-04-13 MED ORDER — OSELTAMIVIR PHOSPHATE 75 MG PO CAPS: 75.0000 mg | ORAL_CAPSULE | Freq: Two times a day (BID) | ORAL | Status: DC

## 2013-04-13 MED ORDER — ALBUTEROL SULFATE (2.5 MG/3ML) 0.083% IN NEBU
2.5000 mg | INHALATION_SOLUTION | RESPIRATORY_TRACT | Status: DC
  Administered 2013-04-14 – 2013-04-16 (×16): 2.5 mg via RESPIRATORY_TRACT
  Filled 2013-04-13 (×17): qty 3

## 2013-04-13 NOTE — Progress Notes (Signed)
Echocardiogram 2D Echocardiogram has been performed.  Connor Ford 04/13/2013, 1:34 PM

## 2013-04-13 NOTE — Progress Notes (Signed)
Chart review complete.  Patient is not eligible for [facility] Care Management services because his/her PCP is not a [facility] primary care provider or is not [facility] affiliated.  For any additional questions or new referrals please contact Tim Henderson BSN RN [facility] Liaison at [phone] °

## 2013-04-13 NOTE — Consult Note (Signed)
PULMONARY / CRITICAL CARE MEDICINE  Name: Connor Ford MRN: [id number] DOB: 22-Nov-1945    ADMISSION DATE:  04/03/2013 CONSULTATION DATE:  04/13/2013  REFERRING MD :  [facility] PRIMARY SERVICE:  PCCM  CHIEF COMPLAINT:  Acute respiratory failure  BRIEF PATIENT DESCRIPTION: 68 yo with past medical history of systolic CHF and sleep apnea admitted 1/15 with dyspnea, fever and pulmonary infiltrates.  On 1/16 developed acute distress and CXR demonstrated worsening bilateral airspace disease.  PCCM was consulted.  SIGNIFICANT EVENTS / STUDIES:  1/15   Admitted for pneumonia 1/16  Transferred to ICU  LINES / TUBES:  CULTURES: 1/15 Blood >>> 1/15 Urine >>> 1/15 Respiratory viral panel >>>  ANTIBIOTICS: Ceftriaxone 1/15 >>> Azithromycin 1/15 >>> 1/16  Vancomycin 1/16 >>> Levaquin 1/16 >>> Tamiflu 1/16 >>>  HISTORY OF PRESENT ILLNESS:  68 yo with past medical history of systolic CHF and sleep apnea admitted 1/15 with dyspnea, fever and pulmonary infiltrates.  On 1/16 developed acute distress and CXR demonstrated worsening bilateral airspace disease.  PCCM was consulted.  PAST MEDICAL HISTORY :  Past Medical History  Diagnosis Date  . 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 Last echo 03/2007 with mild AS, mild-mod AR, mild-mod MR, mild MS   . Asbestos exposure     "when I was a young man"  . Peripheral vascular disease     a) reported hx of infrainguinal arterial occlusive disease. b) Bilateral CEA  in 2001 with RCEA operation complicated by neck hematoma and l forearm hematoma/compartment syndrome s/p fasciotomy  . Hyperlipidemia   . CAD (coronary artery disease)      followed by dr Bettina Gavia.last seen 03/2011  . HTN (hypertension)     followed by dr Lelon Frohlich  . Permanent atrial fibrillation     With LBBB pattern. On Chronic  Coumadin  . Heart murmur   . CHF (congestive heart failure)   . Pneumonia 03/2013    "this is my first bout w/pneumonia" (03/30/2013)  . Exertional shortness of breath   . History of blood transfusion     "related to LUE ORs"   . Sleep apnea     "mild; doesn''t wear mask (04/22/2013)  . Basal cell carcinoma     "all over my body; arms, face, head" (03/31/2013)   Past Surgical History  Procedure Laterality Date  . Carotid endarterectomy Bilateral   . Fasciotomy      for compartment syndrome of L forearm following RCEA  . Mitral valve repair  02/1999  . Fasciotomy  07/14/2011    Procedure: FASCIOTOMY;  Surgeon: Newt Minion, MD;  Location: [place];  Service: Orthopedics;  Laterality: Left;  . I&d extremity  07/16/2011    Procedure: IRRIGATION AND DEBRIDEMENT EXTREMITY;  Surgeon: Newt Minion, MD;  Location: [place];  Service: Orthopedics;  Laterality: Left;  Irrigation and Debridement Left Forearm, apply Acell and Wound VAC  . I&d extremity  08/06/2011    Procedure: IRRIGATION AND DEBRIDEMENT EXTREMITY;  Surgeon: Newt Minion, MD;  Location: [place];  Service: Orthopedics;  Laterality: Left;  Irrigation and Debridement/Wound Closure Left Forearm  . Femur fracture surgery Right 2009    "put rod in"  . Lung surgery Left 2000    LEFT RUPTURED LESION  2008 [facility]  . Skin split graft  09/08/2011    Procedure: SKIN GRAFT  SPLIT THICKNESS;  Surgeon: Newt Minion, MD;  Location: [place];  Service: Orthopedics;  Laterality: Left;  Split Thickness Skin Graft from Left Thigh to Left Forearm   . Eye surgery    . Vasectomy    . Coronary artery bypass graft  02/1999    "CABG X3"  . Cardiac catheterization  02/1999  . Cataract extraction w/ intraocular lens  implant, bilateral Bilateral 2011-2014   Prior to Admission medications   Medication Sig Start Date End Date Taking? Authorizing Provider  amiodarone (PACERONE) 200 MG tablet Take 200-400 mg by mouth See admin instructions. Takes 400mg  on M,W,F  each week All other days takes 200mg    Yes Historical Provider, MD  aspirin EC 81 MG tablet Take 81 mg by mouth at bedtime.    Yes Historical Provider, MD  atorvastatin (LIPITOR) 20 MG tablet Take 20 mg by mouth daily at 6 PM.    Yes Historical Provider, MD  digoxin (LANOXIN) 0.125 MG tablet Take 125 mcg by mouth daily.   Yes Historical Provider, MD  diltiazem (DILACOR XR) 180 MG 24 hr capsule Take 180 mg by mouth daily.   Yes Historical Provider, MD  furosemide (LASIX) 40 MG tablet Take 40 mg by mouth daily.   Yes Historical Provider, MD  levofloxacin (LEVAQUIN) 500 MG tablet Take 1 tablet (500 mg total) by mouth daily. 04/09/13  Yes Alveda Reasons, MD  PE-DM-APAP & Doxylamin-DM-APAP (VICKS DAYQUIL/NYQUIL CLD & FLU) (LIQUID) MISC Take 2 capsules by mouth 2 (two) times daily as needed (for cold).   Yes Historical Provider, MD  potassium chloride (K-DUR,KLOR-CON) 10 MEQ tablet Take 10 mEq by mouth daily.   Yes Historical Provider, MD  ramipril (ALTACE) 5 MG capsule Take 5 mg by mouth daily.   Yes Historical Provider, MD  VITAMIN E PO Take 1 tablet by mouth daily.   Yes Historical Provider, MD  warfarin (COUMADIN) 5 MG tablet Take 2.5-5 mg by mouth See admin instructions. Alternating dosage 2.5mg , 5mg , 2.5mg , etc    Historical Provider, MD   No Known Allergies  FAMILY HISTORY:  Family History  Problem Relation Age of Onset  . Stroke Mother     Died at 18 of a stroke  . Lung disease Brother     Brother died of black lung disease   SOCIAL HISTORY:  reports that he quit smoking about 14 years ago. His smoking use included Cigarettes. He has a 60 pack-year smoking history. He quit smokeless tobacco use about 14 years ago. His smokeless tobacco use included Snuff. He reports that he drinks about 8.4 ounces of alcohol per week. He reports that he does not use illicit drugs.  REVIEW OF SYSTEMS:  Unable to provide.  INTERVAL HISTORY:  VITAL SIGNS: Temp:  [98.6 F (37 C)-100.2 F (37.9 C)]  100.2 F (37.9 C) (01/16 1517) Pulse Rate:  [52-94] 94 (01/16 1443) Resp:  [16-29] 20 (01/16 1448) BP: (92-127)/(37-84) 122/47 mmHg (01/16 1517) SpO2:  [89 %-98 %] 90 % (01/16 0155) Weight:  [91.5 kg (201 lb 11.5 oz)-92.035 kg (202 lb 14.4 oz)] 91.5 kg (201 lb 11.5 oz) (01/16 1600) HEMODYNAMICS:   VENTILATOR SETTINGS:   INTAKE / OUTPUT: Intake/Output     01/15 0701 - 01/16 0700 01/16 0701 - 01/17 0700   P.O.  1200   Total Intake(mL/kg)  1200 (13.1)   Urine (mL/kg/hr)  1050 (1.2)   Total Output   1050   Net   +150  PHYSICAL EXAMINATION: General:  Appears acutely ill, in respiratory distress Neuro:  Awake, alert, oriented HEENT:  PERRL Cardiovascular:  Tachycardic, irregular  Lungs:  Bilateral diminished air entry L > R, rales Abdomen:  Soft, nontender, bowel sounds diminished Musculoskeletal:  Moves all extremities, trace edema Skin:  Intact  LABS: CBC  Recent Labs Lab 04/16/2013 1724 04/13/13 0501  WBC 17.6* 13.7*  HGB 13.2 12.4*  HCT 38.8* 37.1*  PLT 255 230   Coag's  Recent Labs Lab 04/18/2013 1848 04/13/13 0501  INR 1.54* 1.52*   BMET  Recent Labs Lab 04/08/2013 1724 04/13/13 0501  NA 140 141  K 4.9 5.3  CL 102 105  CO2 21 23  BUN 40* 38*  CREATININE 1.96* 1.60*  GLUCOSE 132* 109*   Electrolytes  Recent Labs Lab 04/05/2013 1724 04/07/2013 2245 04/13/13 0501  CALCIUM 8.5  --  8.5  MG  --  2.0  --   PHOS  --  4.0  --    Sepsis Markers  Recent Labs Lab 04/19/2013 1746  LATICACIDVEN 2.22*   ABG No results found for this basename: PHART, PCO2ART, PO2ART,  in the last 168 hours Liver Enzymes  Recent Labs Lab 04/23/2013 1848 04/13/13 0501  AST 84* 110*  ALT 61* 71*  ALKPHOS 93 100  BILITOT 0.9 0.8  ALBUMIN 2.5* 2.2*   Cardiac Enzymes  Recent Labs Lab 04/06/2013 1724 04/09/2013 1840 04/08/2013 2245  TROPONINI  --  <0.30  --   PROBNP 4075.0*  --  2568.0*   Glucose  Recent Labs Lab 04/13/13 1605  GLUCAP 145*   CXR:  1/16  >>> Dense bilateral airspace disease, worsening  ASSESSMENT / PLAN:  PULMONARY A:   Acute hypoxemic respiratory failure. CAP. Possible influenza pneumonitis. P:   Goal SpO2>92, pH>7.30 Supplemental oxygen  May use BiPAP to temporize, but likely need intubation  CARDIOVASCULAR A:  Acute on chronic systolic ( last known EF 91% ) congestive heart failure. SIRS / sepsis. AF. P:  Goal MAP > 65 Trend troponin / lactate TTE ASA, Digoxin, Cardizem as preadmission Consider BB Hold Lipitor as elevated transaminases Hold Altace as AKI Avoid Amiodarone for now  RENAL A:   AKI. P:   Trend BMP Lasix increase to 40 q12h x 48  GASTROINTESTINAL A:   Elevated transaminases, shocked liver? GI Px is not indicated. P:   NPO as at risk for intubation Trend LFT  HEMATOLOGIC A:   Mild anemia. Therapeutic anticoagulation for AF. VTE Px. P:  Trend CBC Hold Warfarin for now as may need procedures, restart later Heparin [place]  INFECTIOUS A:   Suspected CAP. Suspected influenza. P:   Abx / cx as above  ENDOCRINE  A:   Normoglycemia.   P:   Monitor glucose on BMP  NEUROLOGIC A:   No active issues. P:   No intervention required  I have personally obtained history, examined patient, evaluated and interpreted laboratory and imaging results, reviewed medical records, formulated assessment / plan and placed orders.  CRITICAL CARE:  The patient is critically ill with multiple organ systems failure and requires high complexity decision making for assessment and support, frequent evaluation and titration of therapies, application of advanced monitoring technologies and extensive interpretation of multiple databases. Critical Care Time devoted to patient care services described in this note is 40 minutes.   Doree Fudge, MD Pulmonary and [place] Pager: [phone]  04/13/2013, 4:16 PM

## 2013-04-13 NOTE — Progress Notes (Signed)
ANTICOAGULATION CONSULT NOTE - Follow Up Consult  Pharmacy Consult:  Coumadin Indication: atrial fibrillation  No Known Allergies  Patient Measurements: Height: 6\' 4"  (193 cm) Weight: 202 lb 14.4 oz (92.035 kg) IBW/kg (Calculated) : 86.8  Vital Signs: Temp: 98.6 F (37 C) (01/16 0155) Temp src: Oral (01/16 0155) BP: 120/84 mmHg (01/16 0155) Pulse Rate: 68 (01/16 0155)  Labs:  Recent Labs  03/30/2013 1724 04/25/2013 1840 04/11/2013 1848 04/13/13 0501  HGB 13.2  --   --  12.4*  HCT 38.8*  --   --  37.1*  PLT 255  --   --  230  LABPROT  --   --  18.1* 17.9*  INR  --   --  1.54* 1.52*  CREATININE 1.96*  --   --  1.60*  TROPONINI  --  <0.30  --   --     Estimated Creatinine Clearance: 55 ml/min (by C-G formula based on Cr of 1.6).      Assessment: 81 YOM to continue on Coumadin for history of AFib.  INR sub-therapeutic on admit and remains low.  No bleeding reported.  Noted mild elevation in AST/ALT.   Goal of Therapy:  INR 2-3 Monitor platelets by anticoagulation protocol: Yes    Plan:  - Coumadin 5mg  PO today - Daily PT / INR    Tarence Searcy D. Mina Marble, PharmD, BCPS Pager:  [phone] 04/13/2013, 11:18 AM

## 2013-04-13 NOTE — Procedures (Signed)
Central Venous Catheter Insertion Procedure Note  Connor Ford [id number] 04/13/2013  Procedure: Insertion of Central Venous Catheter   Indications: Drug and/or fluid administration   Procedure Details  Consent: Risks of procedure as well as the alternatives and risks of each were explained to the (patient/caregiver). Consent for procedure obtained.   Time Out: Verified patient identification, verified procedure, site/side was marked, verified correct patient position, special equipment/implants available, medications/allergies/relevent history reviewed, required imaging and test results available. Performed   Maximum sterile technique was used including antiseptics, cap, gloves, gown, hand hygiene, mask and sheet.  Skin prep: Chlorhexidine; local anesthetic administered  An antimicrobial bonded/coated triple lumen catheter was placed in the Right IJ vein, and sutured in place, with overlying bandage placed.  Evaluation  Blood flow good  Complications: No apparent complications  Patient did tolerate procedure well.  Chest X-ray ordered to verify placement. CXR: pending.  Signed, Elnora Morrison, MD PGY-3, Internal Medicine Pgr: [phone] 04/13/2013, 11:41 PM  I was present during the entire procedure.  Waynetta Pean, MD [place]

## 2013-04-13 NOTE — Procedures (Signed)
Arterial Catheter Insertion Procedure Note Connor Ford [id number] [birth year]  Procedure: Insertion of Arterial Catheter  Indications: Blood pressure monitoring  Procedure Details Consent: Risks of procedure as well as the alternatives and risks of each were explained to the (patient/caregiver).  Consent for procedure obtained. Time Out: Verified patient identification, verified procedure, site/side was marked, verified correct patient position, special equipment/implants available, medications/allergies/relevent history reviewed, required imaging and test results available.  Performed  Maximum sterile technique was used including . Skin prep: Chlorhexidine; local anesthetic administered 20 gauge catheter was inserted into right radial artery using the Seldinger technique.  Evaluation Blood flow good; BP tracing good. Complications: No apparent complications.   Chriss Driver [facility] 04/13/2013

## 2013-04-13 NOTE — Progress Notes (Addendum)
ANTIBIOTIC CONSULT NOTE - INITIAL  Pharmacy Consult for Vancomycin and Levaquin Indication: pneumonia  No Known Allergies  Patient Measurements: Height: 6\' 4"  (193 cm) Weight: 201 lb 11.5 oz (91.5 kg) IBW/kg (Calculated) : 86.8  Vital Signs: Temp: 100.2 F (37.9 C) (01/16 1517) Temp src: Oral (01/16 1517) BP: 129/53 mmHg (01/16 1615) Pulse Rate: 80 (01/16 1615) Intake/Output from previous day:   Intake/Output from this shift: Total I/O In: 1200 [P.O.:1200] Out: 1050 [Urine:1050]  Labs:  Recent Labs  Apr 30, 2013 1724 04/13/13 0501  WBC 17.6* 13.7*  HGB 13.2 12.4*  PLT 255 230  CREATININE 1.96* 1.60*   Estimated Creatinine Clearance: 55 ml/min (by C-G formula based on Cr of 1.6). No results found for this basename: VANCOTROUGH, VANCOPEAK, VANCORANDOM, GENTTROUGH, GENTPEAK, GENTRANDOM, TOBRATROUGH, TOBRAPEAK, TOBRARND, AMIKACINPEAK, AMIKACINTROU, AMIKACIN,  in the last 72 hours   Microbiology: No results found for this or any previous visit (from the past 720 hour(s)).  Medical History: Past Medical History  Diagnosis Date  . 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 Last echo 03/2007 with mild AS, mild-mod AR, mild-mod MR, mild MS   . Asbestos exposure     "when I was a young man"  . Peripheral vascular disease     a) reported hx of infrainguinal arterial occlusive disease. b) Bilateral CEA  in 2001 with RCEA operation complicated by neck hematoma and l forearm hematoma/compartment syndrome s/p fasciotomy  . Hyperlipidemia   . CAD (coronary artery disease)      followed by dr Bettina Gavia.last seen 03/2011  . HTN (hypertension)     followed by dr Lelon Frohlich  . Permanent atrial fibrillation     With LBBB pattern. On Chronic Coumadin  . Heart murmur   . CHF (congestive heart failure)   . Pneumonia 03/2013    "this is  my first bout w/pneumonia" (30-Apr-2013)  . Exertional shortness of breath   . History of blood transfusion     "related to LUE ORs"   . Sleep apnea     "mild; doesn''t wear mask (2013/04/30)  . Basal cell carcinoma     "all over my body; arms, face, head" (04-30-2013)    Medications:  Anti-infectives   Start     Dose/Rate Route Frequency Ordered Stop   04/13/13 2200  oseltamivir (TAMIFLU) capsule 75 mg  Status:  Discontinued     75 mg Oral 2 times daily 04/13/13 1620 04/13/13 1632   04/13/13 2000  cefTRIAXone (ROCEPHIN) 1 g in dextrose 5 % 50 mL IVPB     1 g 100 mL/hr over 30 Minutes Intravenous Every 24 hours 04/30/13 2114 04/20/13 1959   04/13/13 1730  oseltamivir (TAMIFLU) capsule 30 mg     30 mg Oral 2 times daily 04/13/13 1635 04/18/13 2159   April 30, 2013 2200  azithromycin (ZITHROMAX) tablet 500 mg  Status:  Discontinued     500 mg Oral Every 24 hours 30-Apr-2013 2114 04/13/13 1626   2013/04/30 1930  cefTRIAXone (ROCEPHIN) 1 g in dextrose 5 % 50 mL IVPB     1 g 100 mL/hr over 30 Minutes Intravenous  Once 2013-04-30 1915 2013-04-30 2032   April 30, 2013 1930  azithromycin (ZITHROMAX) 500 mg in dextrose 5 % 250 mL IVPB  Status:  Discontinued     500 mg 250 mL/hr over 60 Minutes Intravenous  Once 30-Apr-2013 1915 Apr 30, 2013  2114     Assessment: 68 year old male admitted with PNA and suspected influenza now to broaden therapy with Vancomycin, Levaquin, and Ceftriaxone due to worsening respiratory status.  He has acute renal insufficiency which requires adjustment of Vancomycin.  Goal of Therapy:  Vancomycin trough level 15-20 mcg/ml  Plan:  Vancomycin 750mg  IV q12h Levaquin 750mg  IV q24h Monitor renal function closely Follow available micro data  Legrand Como, Pharm.D., BCPS, AAHIVP Clinical Pharmacist Phone: [phone] or [phone] 04/13/2013, 4:48 PM  Addendum: Ceftriaxone to be changed to Zosyn.  MD stopped levofloxacin. Plan: Zosyn 3.375g IV q8h (infuse over 4 hours)  Heide Guile,  PharmD, BCPS Clinical Pharmacist Pager [phone]

## 2013-04-13 NOTE — Procedures (Signed)
Intubation Procedure Note Connor Ford [id number] 1945-04-24  Procedure: Intubation Indications: Respiratory insufficiency  Procedure Details Consent: Risks of procedure as well as the alternatives and risks of each were explained to the (patient/caregiver).  Consent for procedure obtained. Time Out: Verified patient identification, verified procedure, site/side was marked, verified correct patient position, special equipment/implants available, medications/allergies/relevent history reviewed, required imaging and test results available.  Performed  Medications used: Propofol and fentanyl Equipment used: Glidescope Grade I View Correct placement confirmed by by auscultation, by CXR and ETCO2 monitor Tube secured at 24 cm at the lip  Evaluation Hemodynamic Status: BP stable throughout; O2 sats: transiently fell during during procedure Patient's Current Condition: stable Complications: No apparent complications Patient did tolerate procedure well. Chest X-ray ordered to verify placement.  CXR: tube position high-repostitioned.   Connor Ford, M.D. Pulmonary and Critical Care Medicine Call [place] with questions [phone] 04/13/2013

## 2013-04-13 NOTE — Progress Notes (Signed)
[facility] PROGRESS NOTE  LARSON LIMONES MWN:[id number] DOB: Jul 17, 1945 DOA: 04/04/2013  PCP: Enid Skeens., MD  Brief HPI: Pt is 68 yo male with complex medical history including atrial fibrillation on Coumadin and amiodarone, digoxin and Diltiazem, HTN (on Ramipril), HLD, systolic CHF with EF 40 % per last 2 D ECHO in 2013, presenting to [facility] ED with main concern of several days duration of progressively worsening shortness of breath that initially started with exertion and then present at rest. Pt explains he saw his PCP and was diagnosed with PNA, and was started on Levaquin but his symptoms did not improved. He was noted to have worsening of his infiltrate and was admitted.  Past medical history:  Past Medical History  Diagnosis Date  . 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 Last echo 03/2007 with mild AS, mild-mod AR, mild-mod MR, mild MS   . Asbestos exposure     "when I was a young man"  . Peripheral vascular disease     a) reported hx of infrainguinal arterial occlusive disease. b) Bilateral CEA  in 2001 with RCEA operation complicated by neck hematoma and l forearm hematoma/compartment syndrome s/p fasciotomy  . Hyperlipidemia   . CAD (coronary artery disease)      followed by dr Bettina Gavia.last seen 03/2011  . HTN (hypertension)     followed by dr Lelon Frohlich  . Permanent atrial fibrillation     With LBBB pattern. On Chronic Coumadin  . Heart murmur   . CHF (congestive heart failure)   . Pneumonia 03/2013    "this is my first bout w/pneumonia" (04/07/2013)  . Exertional shortness of breath   . History of blood transfusion     "related to LUE ORs"   . Sleep apnea     "mild; doesn''t wear mask (04/20/2013)  . Basal cell carcinoma     "all over my body; arms, face, head" (04/15/2013)    Consultants:  None  Procedures: None  Antibiotics: Cef 1/15--> Zith 1/15-->  Subjective: Patient feels better compared to 2 days ago but still short of breath and coughing up blood tinged sputum. Denis any chest pain. No nausea or vomiting or abdominal pain. Denies any history of liver disease. Consumes 2 drinks every day.  Objective: Vital Signs  Filed Vitals:   04/13/2013 2000 04/24/2013 2015 04/07/2013 2115 04/13/13 0155  BP: 124/44 103/37 118/41 120/84  Pulse: 70  59 68  Temp:   98.7 F (37.1 C) 98.6 F (37 C)  TempSrc:   Oral Oral  Resp: 24 29 26 22   Height:   6\' 4"  (1.93 m)   Weight:   92.035 kg (202 lb 14.4 oz)   SpO2: 90%  89% 90%    Intake/Output Summary (Last 24 hours) at 04/13/13 4034 Last data filed at 04/13/13 7425  Gross per 24 hour  Intake      0 ml  Output    400 ml  Net   -400 ml   Filed Weights   03/29/2013 2115  Weight: 92.035 kg (202 lb 14.4 oz)    General appearance: alert, cooperative, appears stated age, no distress and tachypneic Head: Normocephalic, without obvious abnormality, atraumatic Resp: crackles and rhonchi right lung. No wheezing. Cardio: s1s2 irreg irreg, no s3 s4. no rubs murmurs or bruits. no pedal edema Extremities: extremities normal, atraumatic, no cyanosis or  edema Pulses: 2+ and symmetric Skin: Skin color, texture, turgor normal. No rashes or lesions Lymph nodes: Cervical, supraclavicular, and axillary nodes normal. Neurologic: Alert and oriented X 3, normal strength and tone. No focal deficits  Lab Results:  Basic Metabolic Panel:  Recent Labs Lab 04/14/2013 1724 04/19/2013 2245 04/13/13 0501  NA 140  --  141  K 4.9  --  5.3  CL 102  --  105  CO2 21  --  23  GLUCOSE 132*  --  109*  BUN 40*  --  38*  CREATININE 1.96*  --  1.60*  CALCIUM 8.5  --  8.5  MG  --  2.0  --   PHOS  --  4.0  --    Liver Function Tests:  Recent Labs Lab 04/26/2013 1848 04/13/13 0501  AST 84* 110*  ALT 61* 71*  ALKPHOS 93 100  BILITOT 0.9 0.8  PROT  7.0 6.3  ALBUMIN 2.5* 2.2*   CBC:  Recent Labs Lab 04/27/2013 1724 04/13/13 0501  WBC 17.6* 13.7*  HGB 13.2 12.4*  HCT 38.8* 37.1*  MCV 95.6 95.6  PLT 255 230   Cardiac Enzymes:  Recent Labs Lab 04/17/2013 1840  TROPONINI <0.30   BNP (last 3 results)  Recent Labs  04/01/2013 1724 04/23/2013 2245  PROBNP 4075.0* 2568.0*    Studies/Results: Dg Chest 2 View  04/20/2013   CLINICAL DATA:  Shortness of breath and cough. Recent diagnosis of pneumonia.  EXAM: CHEST  2 VIEW  COMPARISON:  PA and lateral chest 04/09/2013.  FINDINGS: Bilateral airspace disease, worse on the right, shows marked progression since the prior examination. There is marked cardiomegaly. No pneumothorax or pleural effusion is identified. No focal bony abnormality.  IMPRESSION: Marked worsening in right worse than left airspace disease consistent with pneumonia and/or asymmetric edema.   Electronically Signed   By: Inge Rise M.D.   On: 04/15/2013 19:09    Medications:  Scheduled: . [START ON 04/14/2013] amiodarone  200 mg Oral Custom  . amiodarone  400 mg Oral Custom  . aspirin EC  81 mg Oral QHS  . atorvastatin  20 mg Oral q1800  . azithromycin  500 mg Oral Q24H  . cefTRIAXone (ROCEPHIN)  IV  1 g Intravenous Q24H  . digoxin  125 mcg Oral Daily  . diltiazem  180 mg Oral Daily  . [START ON 04/14/2013] furosemide  40 mg Oral Daily  . influenza vac split quadrivalent PF  0.5 mL Intramuscular Tomorrow-1000  . pneumococcal 23 valent vaccine  0.5 mL Intramuscular Tomorrow-1000  . sodium chloride  3 mL Intravenous Q12H  . Warfarin - Pharmacist Dosing Inpatient   Does not apply q1800   Continuous:  RJJ:OACZYS chloride, sodium chloride  Assessment/Plan:  Principal Problem:   Acute respiratory failure Active Problems:   HYPERLIPIDEMIA   HYPERTENSION   CAP (community acquired pneumonia)   Leukocytosis   Transaminitis   Acute renal failure   PNA (pneumonia)    Acute respiratory failure with  Hypoxia Secondary to PNA. Continue O2. Currently at 5lpm. States he feels better. He is tachypneic but no accessory muscle use is noted. Continue to monitor on floor. No indication for nebs currently. Influenza PCR was negative. Lactic acid was mildly elevated yesterday.  CAP (community acquired pneumonia)  Continue Cef and Zith for now. As above.  History of Chronic Systolic CHF with Moderate AS Last 2 D ECHO in 2013, EF 40%. No evidence for fluid overload currently. If anything, he appears  to be volume depleted. Will hold Lasix for today. Allow him to hydrate orally as creatinine has improved. Await ECHO. Weight on admission 205 lbs. I/O.  Chronic Atrial fibrillation  Continue Amiodarone and Digoxin. Check Dig level due to renal failure. Coumadin per pharmacy. Cardiologist is in [place].  Acute renal failure  Creatinine is better. Hold Ramipril. Resume lasix from 1/17 if creatinine continues to improve. Hold off on IVF and allow oral hydration.   Transaminitis  Likely related to sepsis. He does have 2 drinks daily but unlikely to be due to ETOH. Check hepatitis panel and [place]. Repeat CMET in AM   HYPERLIPIDEMIA  Continue statin   HYPERTENSION  Holding Ramipril due to ARF. Monitor BP closely.   Code Status: Full Code  DVT Prophylaxis: On warfarin    Family Communication: Discussed with patient  Disposition Plan: Not ready for discharge. Lives with wife.    LOS: 1 day   [facility] Pager [phone] 04/13/2013, 8:24 AM  If 8PM-8AM, please contact night-coverage at [email or web address], password [facility]

## 2013-04-13 NOTE — Significant Event (Signed)
Rapid Response Event Note  Overview: Time Called: 2947 Arrival Time: 6546 Event Type: Respiratory  Initial Focused Assessment: Patient in acute respiratory distress using accessory muscles.  Increased WOB, RR 35 94% O2 sats on 100% NRB Crackles through out on right Patient states this acute distress started while he was in bed and has not been OOB recently.  Interventions: PCXR done MD at bedside to assess patient.  MD notified significant other of patient status and pending transfer Transferred to 2M14 via bed with O2 via NRB and heart monitor. RN at bedside upon transfer.  Event Summary: Name of Physician Notified: [place] Pink at 32  Name of Consulting Physician Notified: Dr Joya Gaskins, Dr Oliver Pila at 1540  Outcome: Transferred (Comment) [phone])  Event End Time: [place]  Raliegh Ip

## 2013-04-13 NOTE — Progress Notes (Signed)
Pt o2 sats dropped to 72% on 4 Liters [place], pt placed on 100% NRB sats 93%.  MD notified and ordered breathing tx and stat cxr.  Breathing tx given with no improvement.  Rapid response notified, MD at bedside ordered to transfer pt.

## 2013-04-13 NOTE — Progress Notes (Signed)
  Called by Rn as patient was more hypoxic than this morning with sats in 80's despite ventimask.  On arrival to bedside patient was noted to be on 100% NRB saturating about 92%.  He was using accessory muscles. He denied any dizziness or chest pains. There was no exertion prior to this episode of hypoxia.  Stat CXr was obtained which revealed significant worsening in his infiltrates on right. Neb treatment ordered.  Discussed with Dr. Asencion Noble and patient to be transferred to ICU under PCCM care.  Patient aware of findings. He is a full code and agreeable to intubation if needed.  At his request I spoke to his significant other Ms. Johnson at [phone] and updated her as well. She will notify other family and will come to the hospital.  Care handed over to PCCM team.  Bonnielee Haff 3:45 PM

## 2013-04-13 NOTE — Progress Notes (Signed)
   PHARMACIST - PHYSICIAN COMMUNICATION  DR: Oliver Pila CONCERNING: Oseltamivir 30 mg capsule shortage  DESCRIPTION:   This patient has an order for Oseltamivir (Tamiflu) 75 mg po BID x 5 days and has an @CRCL @ 55 ml/min The package insert for Oseltamivir recommends 30mg  BID x 5 days for patients with CrCl 30-60 ml/min.   RECOMMENDATION:  Oseltamivir 30 mg PO BID x 5 days has been substituted for your patient.   Nicole Cella, RPh Clinical Pharmacist Pager: [phone] 04/13/2013, 16:43 PM

## 2013-04-14 ENCOUNTER — Inpatient Hospital Stay (HOSPITAL_COMMUNITY): Payer: Medicare PPO

## 2013-04-14 DIAGNOSIS — J80 Acute respiratory distress syndrome: Secondary | ICD-10-CM | POA: Diagnosis present

## 2013-04-14 DIAGNOSIS — J9589 Other postprocedural complications and disorders of respiratory system, not elsewhere classified: Secondary | ICD-10-CM

## 2013-04-14 LAB — GLUCOSE, CAPILLARY
GLUCOSE-CAPILLARY: 133 mg/dL — AB (ref 70–99)
GLUCOSE-CAPILLARY: 138 mg/dL — AB (ref 70–99)
Glucose-Capillary: 120 mg/dL — ABNORMAL HIGH (ref 70–99)
Glucose-Capillary: 118 mg/dL — ABNORMAL HIGH (ref 70–99)
Glucose-Capillary: 117 mg/dL — ABNORMAL HIGH (ref 70–99)
Glucose-Capillary: 130 mg/dL — ABNORMAL HIGH (ref 70–99)

## 2013-04-14 LAB — HEPATITIS PANEL, ACUTE
HCV AB: NEGATIVE
Hep A IgM: NONREACTIVE
Hep B C IgM: NONREACTIVE
Hepatitis B Surface Ag: NEGATIVE

## 2013-04-14 LAB — BLOOD GAS, ARTERIAL
Acid-base deficit: 2.2 mmol/L — ABNORMAL HIGH (ref 0.0–2.0)
Bicarbonate: 23.1 mEq/L (ref 20.0–24.0)
FIO2: 0.7 %
LHR: 30 {breaths}/min
O2 Saturation: 98.6 %
PCO2 ART: 47.1 mmHg — AB (ref 35.0–45.0)
PEEP/CPAP: 10 cmH2O
PH ART: 7.311 — AB (ref 7.350–7.450)
PO2 ART: 150 mmHg — AB (ref 80.0–100.0)
Patient temperature: 98.7
TCO2: 24.5 mmol/L (ref 0–100)
VT: 530 mL

## 2013-04-14 LAB — COMPREHENSIVE METABOLIC PANEL
ALT: 104 U/L — AB (ref 0–53)
AST: 151 U/L — ABNORMAL HIGH (ref 0–37)
Albumin: 2.2 g/dL — ABNORMAL LOW (ref 3.5–5.2)
Alkaline Phosphatase: 110 U/L (ref 39–117)
BUN: 33 mg/dL — ABNORMAL HIGH (ref 6–23)
CALCIUM: 8.1 mg/dL — AB (ref 8.4–10.5)
CO2: 20 mEq/L (ref 19–32)
CREATININE: 1.51 mg/dL — AB (ref 0.50–1.35)
Chloride: 105 mEq/L (ref 96–112)
GFR calc Af Amer: 53 mL/min — ABNORMAL LOW (ref >90)
GFR calc non Af Amer: 46 mL/min — ABNORMAL LOW (ref >90)
GLUCOSE: 133 mg/dL — AB (ref 70–99)
Potassium: 5.3 mEq/L (ref 3.7–5.3)
SODIUM: 142 meq/L (ref 137–147)
TOTAL PROTEIN: 6.6 g/dL (ref 6.0–8.3)
Total Bilirubin: 0.9 mg/dL (ref 0.3–1.2)

## 2013-04-14 LAB — URINE CULTURE
Colony Count: NO GROWTH
Culture: NO GROWTH

## 2013-04-14 LAB — POCT I-STAT 3, ART BLOOD GAS (G3+)
Acid-base deficit: 3 mmol/L — ABNORMAL HIGH (ref 0.0–2.0)
Bicarbonate: 25.1 mEq/L — ABNORMAL HIGH (ref 20.0–24.0)
Bicarbonate: 26.2 mEq/L — ABNORMAL HIGH (ref 20.0–24.0)
O2 SAT: 100 %
O2 SAT: 98 %
PCO2 ART: 69.3 mmHg — AB (ref 35.0–45.0)
TCO2: 26 mmol/L (ref 0–100)
TCO2: 28 mmol/L (ref 0–100)
pCO2 arterial: 46.5 mmHg — ABNORMAL HIGH (ref 35.0–45.0)
pH, Arterial: 7.194 — CL (ref 7.350–7.450)
pH, Arterial: 7.349 — ABNORMAL LOW (ref 7.350–7.450)
pO2, Arterial: 115 mmHg — ABNORMAL HIGH (ref 80.0–100.0)
pO2, Arterial: 248 mmHg — ABNORMAL HIGH (ref 80.0–100.0)

## 2013-04-14 LAB — T3, FREE: T3 FREE: 2.3 pg/mL (ref 2.3–4.2)

## 2013-04-14 LAB — TSH: TSH: 20.637 u[IU]/mL — ABNORMAL HIGH (ref 0.350–4.500)

## 2013-04-14 LAB — CBC
HCT: 38.1 % — ABNORMAL LOW (ref 39.0–52.0)
Hemoglobin: 12.6 g/dL — ABNORMAL LOW (ref 13.0–17.0)
MCH: 32.1 pg (ref 26.0–34.0)
MCHC: 33.1 g/dL (ref 30.0–36.0)
MCV: 97.2 fL (ref 78.0–100.0)
PLATELETS: 278 10*3/uL (ref 150–400)
RBC: 3.92 MIL/uL — ABNORMAL LOW (ref 4.22–5.81)
RDW: 14.9 % (ref 11.5–15.5)
WBC: 15.1 10*3/uL — ABNORMAL HIGH (ref 4.0–10.5)

## 2013-04-14 LAB — TROPONIN I: Troponin I: <0.3 ng/mL (ref <0.30)

## 2013-04-14 LAB — T4, FREE: Free T4: 0.8 ng/dL (ref 0.80–1.80)

## 2013-04-14 LAB — LACTIC ACID, PLASMA: Lactic Acid, Venous: 1.4 mmol/L (ref 0.5–2.2)

## 2013-04-14 LAB — PROTIME-INR
INR: 1.57 — AB (ref 0.00–1.49)
PROTHROMBIN TIME: 18.3 s — AB (ref 11.6–15.2)

## 2013-04-14 MED ORDER — MIDAZOLAM HCL 2 MG/2ML IJ SOLN
1.0000 mg | INTRAMUSCULAR | Status: DC | PRN
  Administered 2013-04-14 – 2013-04-16 (×13): 2 mg via INTRAVENOUS
  Filled 2013-04-14 (×13): qty 2

## 2013-04-14 MED ORDER — ACETAMINOPHEN 160 MG/5ML PO SOLN
650.0000 mg | Freq: Four times a day (QID) | ORAL | Status: DC | PRN
  Administered 2013-04-14 – 2013-04-15 (×3): 650 mg
  Filled 2013-04-14 (×3): qty 20.3

## 2013-04-14 MED ORDER — SODIUM CHLORIDE 0.9 % IV SOLN
0.0000 ug/h | INTRAVENOUS | Status: DC
  Administered 2013-04-14: 100 ug/h via INTRAVENOUS
  Administered 2013-04-14: 200 ug/h via INTRAVENOUS
  Administered 2013-04-15: 250 ug/h via INTRAVENOUS
  Administered 2013-04-15: 200 ug/h via INTRAVENOUS
  Administered 2013-04-16: 125 ug/h via INTRAVENOUS
  Filled 2013-04-14 (×6): qty 50

## 2013-04-14 MED ORDER — DILTIAZEM 12 MG/ML ORAL SUSPENSION
60.0000 mg | Freq: Three times a day (TID) | ORAL | Status: DC
  Administered 2013-04-14 – 2013-04-16 (×6): 60 mg
  Filled 2013-04-14 (×9): qty 6

## 2013-04-14 MED ORDER — PROPOFOL 10 MG/ML IV EMUL
5.0000 ug/kg/min | INTRAVENOUS | Status: DC
  Administered 2013-04-14: 50 ug/kg/min via INTRAVENOUS

## 2013-04-14 MED ORDER — ASPIRIN 81 MG PO CHEW
81.0000 mg | CHEWABLE_TABLET | Freq: Every day | ORAL | Status: DC
  Administered 2013-04-14 – 2013-04-29 (×16): 81 mg via ORAL
  Filled 2013-04-14 (×17): qty 1

## 2013-04-14 NOTE — Progress Notes (Signed)
eLink Physician-Brief Progress Note Patient Name: Connor Ford DOB: [birth year] MRN: [id number]  Date of Service  04/14/2013   HPI/Events of Note   ARDS, awake, vent asynchrony  eICU Interventions  Add propofol in addition to fentanyl gtt   Intervention Category Minor Interventions: Agitation / anxiety - evaluation and management  Loys Hoselton 04/14/2013, 12:36 AM

## 2013-04-14 NOTE — Consult Note (Signed)
PULMONARY / CRITICAL CARE MEDICINE  Name: Connor Ford MRN: [id number] DOB: 04/02/1945    ADMISSION DATE:  2013-04-27 CONSULTATION DATE:  04/13/2013  REFERRING MD :  [facility] PRIMARY SERVICE:  PCCM  CHIEF COMPLAINT:  Acute respiratory failure  BRIEF PATIENT DESCRIPTION: 68 yo with past medical history of systolic CHF and sleep apnea admitted 1/15 with dyspnea, fever and pulmonary infiltrates.  On 1/16 developed acute distress and CXR demonstrated worsening bilateral airspace disease.  PCCM was consulted.  SIGNIFICANT EVENTS / STUDIES:  1/15   Admitted for pneumonia 1/16  Transferred to ICU  LINES / TUBES: ETT 1/16 CVL 1/16  CULTURES: 1/15 Blood >>> 1/15 Urine >>> 1/15 Respiratory viral panel >>>  ANTIBIOTICS: Ceftriaxone 1/15 >>> Azithromycin 1/15 >>> 1/16  Vancomycin 1/16 >>> Levaquin 1/16 >>> Tamiflu 1/16 >>>  INTERVAL HISTORY: Stable at present on vent,  VITAL SIGNS: Temp:  [97.5 F (36.4 C)-101.6 F (38.7 C)] 99 F (37.2 C) (01/17 0858) Pulse Rate:  [57-94] 57 (01/17 0858) Resp:  [18-40] 32 (01/17 0858) BP: (85-151)/(31-61) 106/39 mmHg (01/17 0800) SpO2:  [79 %-94 %] 93 % (01/17 0858) Arterial Line BP: (88-189)/(35-58) 97/38 mmHg (01/17 0858) FiO2 (%):  [60 %-100 %] 60 % (01/17 0800) Weight:  [90.8 kg (200 lb 2.8 oz)-91.5 kg (201 lb 11.5 oz)] 90.8 kg (200 lb 2.8 oz) (01/17 0500) HEMODYNAMICS:   VENTILATOR SETTINGS: Vent Mode:  [-] PRVC FiO2 (%):  [60 %-100 %] 60 % Set Rate:  [30 bmp] 30 bmp Vt Set:  [530 mL] 530 mL PEEP:  [10 cmH20-14 cmH20] 10 cmH20 Plateau Pressure:  [27 cmH20-29 cmH20] 29 cmH20 INTAKE / OUTPUT: Intake/Output     01/16 0701 - 01/17 0700 01/17 0701 - 01/18 0700   P.O. 1200    I.V. (mL/kg) 105 (1.2) 40 (0.4)   IV Piggyback 550    Total Intake(mL/kg) 1855 (20.4) 40 (0.4)   Urine (mL/kg/hr) 2950 (1.4) 85 (0.3)   Total Output 2950 85   Net -1095 -45          PHYSICAL EXAMINATION: General:  No distress , on vent Neuro:  Awake,  alert, oriented HEENT:  PERRL Cardiovascular:  Tachycardic, irregular  Lungs:  bilat rales Abdomen:  Soft, nontender, bowel sounds diminished Musculoskeletal:  Moves all extremities, trace edema Skin:  Intact  LABS: CBC  Recent Labs Lab 27-Apr-2013 1724 04/13/13 0501 04/14/13 0415  WBC 17.6* 13.7* 15.1*  HGB 13.2 12.4* 12.6*  HCT 38.8* 37.1* 38.1*  PLT 255 230 278   Coag's  Recent Labs Lab 27-Apr-2013 1848 04/13/13 0501 04/14/13 0415  INR 1.54* 1.52* 1.57*   BMET  Recent Labs Lab 27-Apr-2013 1724 04/13/13 0501 04/14/13 0415  NA 140 141 142  K 4.9 5.3 5.3  CL 102 105 105  CO2 21 23 20   BUN 40* 38* 33*  CREATININE 1.96* 1.60* 1.51*  GLUCOSE 132* 109* 133*   Electrolytes  Recent Labs Lab 04/27/13 1724 2013/04/27 2245 04/13/13 0501 04/14/13 0415  CALCIUM 8.5  --  8.5 8.1*  MG  --  2.0  --   --   PHOS  --  4.0  --   --    Sepsis Markers  Recent Labs Lab April 27, 2013 1746 04/13/13 1810 04/14/13 0421  LATICACIDVEN 2.22* 1.5 1.4   ABG  Recent Labs Lab 04/13/13 2130 04/14/13 0007 04/14/13 0356  PHART 7.486* 7.194* 7.311*  PCO2ART 29.5* 69.3* 47.1*  PO2ART 51.0* 248.0* 150.0*   Liver Enzymes  Recent Labs Lab 04/27/13 1848  04/13/13 0501 04/14/13 0415  AST 84* 110* 151*  ALT 61* 71* 104*  ALKPHOS 93 100 110  BILITOT 0.9 0.8 0.9  ALBUMIN 2.5* 2.2* 2.2*   Cardiac Enzymes  Recent Labs Lab 05/07/2013 1724 05/07/13 1840 05/07/13 2245 04/13/13 1810  TROPONINI  --  <0.30  --  <0.30  PROBNP 4075.0*  --  2568.0*  --    Glucose  Recent Labs Lab 04/13/13 1605 04/13/13 1906 04/14/13 0055 04/14/13 0400  GLUCAP 145* 128* 133* 138*   CXR:  1/17 >>> Dense bilateral airspace disease, worsening ARDS pattern  ASSESSMENT / PLAN:  PULMONARY A:   Acute hypoxemic respiratory failure. CAP. Possible influenza pneumonitis. P:   Goal SpO2>92, pH>7.30 Supplemental oxygen  May use BiPAP to temporize, but likely need intubation  CARDIOVASCULAR A:   Acute on chronic systolic  congestive heart failure. SIRS / sepsis. ECHO NORMAL EF.  Mod mitral stensosi, pulm HTN 27mmHg PAP AF. P:  Goal MAP > 65 Trend troponin / lactate ASA, Digoxin, Cardizem as preadmission add BB Hold Lipitor as elevated transaminases Hold Altace as AKI Avoid Amiodarone for now  RENAL A:   AKI. P:   Trend BMP Cont Lasix increase to 40 q12h x 24  GASTROINTESTINAL A:   Elevated transaminases, shocked liver? GI Px is not indicated. P:   NPO as at risk for intubation Trend LFT  HEMATOLOGIC A:   Mild anemia. Therapeutic anticoagulation for AF. VTE Px. P:  Trend CBC Hold Warfarin for now as may need procedures, restart later Heparin [place]  INFECTIOUS A:   Suspected CAP. Suspected influenza. P:   Abx / cx as above  ENDOCRINE  A:   Normoglycemia.   P:   Monitor glucose on BMP  NEUROLOGIC A:   No active issues. P:   No intervention required  I have personally obtained history, examined patient, evaluated and interpreted laboratory and imaging results, reviewed medical records, formulated assessment / plan and placed orders.  CRITICAL CARE:  The patient is critically ill with multiple organ systems failure and requires high complexity decision making for assessment and support, frequent evaluation and titration of therapies, application of advanced monitoring technologies and extensive interpretation of multiple databases. Critical Care Time devoted to patient care services described in this note is 40 minutes.   Asencion Noble, MD Beeper  [phone]  Cell  [phone]  If no response or cell goes to voicemail, call beeper [phone]  [facility] and [place] Pager: [phone]  04/14/2013, 9:44 AM

## 2013-04-14 NOTE — Progress Notes (Signed)
INITIAL NUTRITION ASSESSMENT  DOCUMENTATION CODES Per approved criteria  -Not Applicable   INTERVENTION: - If pt unable to be extubated in the next 24-48 hours, recommend TF initiation of Oxepa start at 73ml/hr increase by 75ml every 4 hours to goal of 78ml/hr which will provide 2520 calories, 105g protein, and 137ml free water and meet 99% estimated calorie needs, 96% estimated protein needs. If IVF d/c, recommend 241ml water flushes 6 times/day.  - If TF started, recommend start adult enteral protocol - Unit RD to continue to monitor   NUTRITION DIAGNOSIS: Inadequate oral intake related to inability to eat as evidenced by NPO.   Goal: TF initiation with goal to meet >90% of estimated nutritional needs  Monitor:  Weights, labs, TF initiation, vent status  Reason for Assessment: Ventilated pt  68 y.o. male  Admitting Dx: Acute respiratory failure  ASSESSMENT: Pt with complex medical history including atrial fibrillation on Coumadin and amiodarone, digoxin and Diltiazem, HTN, HLD, systolic CHF, presented with main concern of several days duration of progressively worsening shortness of breath that initially started with exertion and now present at rest. Pt explains he has seen his PCP and was diagnosed with PNA, and was started on Levaquin but his symptoms have not improved. He now has fevers, chills, malaise, poor oral intake. Had rapid response yesterday for acute respiratory distress, was intubated last night. On ARDS protocol.   Patient is currently intubated on ventilator support.  MV: 19 L/min Temp (24hrs), Avg:99 F (37.2 C), Min:97.5 F (36.4 C), Max:101.6 F (38.7 C)  Propofol: off  BUN/Cr elevated with low GFR AST/ALT elevated    Height: Ht Readings from Last 1 Encounters:  04/13/13 6\' 4"  (1.93 m)    Weight: Wt Readings from Last 1 Encounters:  04/14/13 200 lb 2.8 oz (90.8 kg)    Ideal Body Weight: 202 lb  % Ideal Body Weight: 99%  Wt Readings from  Last 10 Encounters:  04/14/13 200 lb 2.8 oz (90.8 kg)  04/09/13 205 lb (92.987 kg)  08/06/11 189 lb 13.1 oz (86.1 kg)  08/06/11 189 lb 13.1 oz (86.1 kg)  08/05/11 201 lb 8 oz (91.4 kg)  07/19/11 203 lb 4.8 oz (92.216 kg)  07/19/11 203 lb 4.8 oz (92.216 kg)  07/19/11 203 lb 4.8 oz (92.216 kg)    Usual Body Weight: 205 lb earlier this month  % Usual Body Weight: 97%  BMI:  Body mass index is 24.38 kg/(m^2).  Estimated Nutritional Needs: Kcal: 2554 Protein: 109-136g/day Fluid: >2.5L/day  Skin: Intact  Diet Order: NPO  EDUCATION NEEDS: -No education needs identified at this time   Intake/Output Summary (Last 24 hours) at 04/14/13 1306 Last data filed at 04/14/13 0800  Gross per 24 hour  Intake   1295 ml  Output   2635 ml  Net  -1340 ml    Last BM: 1/15  Labs:   Recent Labs Lab 04/11/2013 1724 04/01/2013 2245 04/13/13 0501 04/14/13 0415  NA 140  --  141 142  K 4.9  --  5.3 5.3  CL 102  --  105 105  CO2 21  --  23 20  BUN 40*  --  38* 33*  CREATININE 1.96*  --  1.60* 1.51*  CALCIUM 8.5  --  8.5 8.1*  MG  --  2.0  --   --   PHOS  --  4.0  --   --   GLUCOSE 132*  --  109* 133*    CBG (last  3)   Recent Labs  04/14/13 0055 04/14/13 0400 04/14/13 1127  GLUCAP 133* 138* 118*    Scheduled Meds: . albuterol  2.5 mg Nebulization Q4H  . antiseptic oral rinse  15 mL Mouth Rinse QID  . aspirin EC  81 mg Oral QHS  . chlorhexidine  15 mL Mouth Rinse BID  . digoxin  125 mcg Oral Daily  . diltiazem  180 mg Oral Daily  . furosemide  40 mg Intravenous Q12H  . heparin subcutaneous  5,000 Units Subcutaneous Q8H  . influenza vac split quadrivalent PF  0.5 mL Intramuscular Tomorrow-1000  . oseltamivir  30 mg Oral BID  . pantoprazole (PROTONIX) IV  40 mg Intravenous Daily  . piperacillin-tazobactam (ZOSYN)  IV  3.375 g Intravenous Q8H  . pneumococcal 23 valent vaccine  0.5 mL Intramuscular Tomorrow-1000  . sodium chloride  3 mL Intravenous Q12H  . vancomycin  750  mg Intravenous Q12H    Continuous Infusions: . fentaNYL infusion INTRAVENOUS 100 mcg/hr (04/14/13 1038)    Past Medical History  Diagnosis Date  . 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 Last echo 03/2007 with mild AS, mild-mod AR, mild-mod MR, mild MS   . Asbestos exposure     "when I was a young man"  . Peripheral vascular disease     a) reported hx of infrainguinal arterial occlusive disease. b) Bilateral CEA  in 2001 with RCEA operation complicated by neck hematoma and l forearm hematoma/compartment syndrome s/p fasciotomy  . Hyperlipidemia   . CAD (coronary artery disease)      followed by dr Bettina Gavia.last seen 03/2011  . HTN (hypertension)     followed by dr Lelon Frohlich  . Permanent atrial fibrillation     With LBBB pattern. On Chronic Coumadin  . Heart murmur   . CHF (congestive heart failure)   . Pneumonia 03/2013    "this is my first bout w/pneumonia" (04/07/2013)  . Exertional shortness of breath   . History of blood transfusion     "related to LUE ORs"   . Sleep apnea     "mild; doesn''t wear mask (04/07/2013)  . Basal cell carcinoma     "all over my body; arms, face, head" (03/30/2013)    Past Surgical History  Procedure Laterality Date  . Carotid endarterectomy Bilateral   . Fasciotomy      for compartment syndrome of L forearm following RCEA  . Mitral valve repair  02/1999  . Fasciotomy  07/14/2011    Procedure: FASCIOTOMY;  Surgeon: Newt Minion, MD;  Location: [place];  Service: Orthopedics;  Laterality: Left;  . I&d extremity  07/16/2011    Procedure: IRRIGATION AND DEBRIDEMENT EXTREMITY;  Surgeon: Newt Minion, MD;  Location: [place];  Service: Orthopedics;  Laterality: Left;  Irrigation and Debridement Left Forearm, apply Acell and Wound VAC  . I&d extremity  08/06/2011    Procedure: IRRIGATION AND  DEBRIDEMENT EXTREMITY;  Surgeon: Newt Minion, MD;  Location: [place];  Service: Orthopedics;  Laterality: Left;  Irrigation and Debridement/Wound Closure Left Forearm  . Femur fracture surgery Right 2009    "put rod in"  . Lung surgery Left 2000    LEFT RUPTURED LESION  2008 [facility]  . Skin split graft  09/08/2011    Procedure: SKIN GRAFT SPLIT THICKNESS;  Surgeon: Newt Minion,  MD;  Location: [place];  Service: Orthopedics;  Laterality: Left;  Split Thickness Skin Graft from Left Thigh to Left Forearm   . Eye surgery    . Vasectomy    . Coronary artery bypass graft  02/1999    "CABG X3"  . Cardiac catheterization  02/1999  . Cataract extraction w/ intraocular lens  implant, bilateral Bilateral 2011-2014    Mikey College MS, RD, LDN [phone] Weekend/After Hours Pager

## 2013-04-14 NOTE — Progress Notes (Signed)
Brief Interval Progress Note  The patient was noted to have MAP's in the low 50's.  Of note, the patient was started on propofol earlier tonight for vent dyssynchony and agitation. -change propofol to versed prn -if MAP's remain low, may need to add pressor support   Signed, Elnora Morrison, PGY3 04/14/2013, 2:15 AM

## 2013-04-14 NOTE — Procedures (Signed)
Intubation Procedure Note Connor Ford [id number] 04-09-1945  Procedure: Intubation Indications: Respiratory insufficiency  Procedure Details Consent: Risks of procedure as well as the alternatives and risks of each were explained to the (patient/caregiver).  Consent for procedure obtained. Time Out: Verified patient identification, verified procedure, site/side was marked, verified correct patient position, special equipment/implants available, medications/allergies/relevent history reviewed, required imaging and test results available.  Performed  Maximum sterile technique was used including antiseptics, cap, gloves, gown, hand hygiene, mask and sheet.  MAC and 4    Evaluation Hemodynamic Status: BP stable throughout; O2 sats: stable throughout Patient's Current Condition: stable Complications: No apparent complications Patient did tolerate procedure well. Chest X-ray ordered to verify placement.  CXR: tube position high-repostitioned.   Chriss Driver [facility] 04/14/2013

## 2013-04-15 ENCOUNTER — Inpatient Hospital Stay (HOSPITAL_COMMUNITY): Payer: Medicare PPO

## 2013-04-15 DIAGNOSIS — J9600 Acute respiratory failure, unspecified whether with hypoxia or hypercapnia: Secondary | ICD-10-CM

## 2013-04-15 LAB — CBC WITH DIFFERENTIAL/PLATELET
BASOS PCT: 0 % (ref 0–1)
Basophils Absolute: 0 10*3/uL (ref 0.0–0.1)
Eosinophils Absolute: 0.1 10*3/uL (ref 0.0–0.7)
Eosinophils Relative: 1 % (ref 0–5)
HEMATOCRIT: 36.2 % — AB (ref 39.0–52.0)
HEMOGLOBIN: 12.1 g/dL — AB (ref 13.0–17.0)
Lymphocytes Relative: 6 % — ABNORMAL LOW (ref 12–46)
Lymphs Abs: 0.6 10*3/uL — ABNORMAL LOW (ref 0.7–4.0)
MCH: 32.7 pg (ref 26.0–34.0)
MCHC: 33.4 g/dL (ref 30.0–36.0)
MCV: 97.8 fL (ref 78.0–100.0)
MONO ABS: 0.7 10*3/uL (ref 0.1–1.0)
MONOS PCT: 8 % (ref 3–12)
NEUTROS ABS: 8.2 10*3/uL — AB (ref 1.7–7.7)
Neutrophils Relative %: 85 % — ABNORMAL HIGH (ref 43–77)
Platelets: 379 10*3/uL (ref 150–400)
RBC: 3.7 MIL/uL — ABNORMAL LOW (ref 4.22–5.81)
RDW: 15.3 % (ref 11.5–15.5)
WBC: 9.6 10*3/uL (ref 4.0–10.5)

## 2013-04-15 LAB — GLUCOSE, CAPILLARY
GLUCOSE-CAPILLARY: 127 mg/dL — AB (ref 70–99)
Glucose-Capillary: 113 mg/dL — ABNORMAL HIGH (ref 70–99)
Glucose-Capillary: 114 mg/dL — ABNORMAL HIGH (ref 70–99)
Glucose-Capillary: 117 mg/dL — ABNORMAL HIGH (ref 70–99)
Glucose-Capillary: 118 mg/dL — ABNORMAL HIGH (ref 70–99)
Glucose-Capillary: 134 mg/dL — ABNORMAL HIGH (ref 70–99)

## 2013-04-15 LAB — BASIC METABOLIC PANEL
BUN: 39 mg/dL — ABNORMAL HIGH (ref 6–23)
BUN: 39 mg/dL — ABNORMAL HIGH (ref 6–23)
CHLORIDE: 105 meq/L (ref 96–112)
CO2: 25 meq/L (ref 19–32)
CO2: 27 mEq/L (ref 19–32)
CREATININE: 1.4 mg/dL — AB (ref 0.50–1.35)
Calcium: 8.4 mg/dL (ref 8.4–10.5)
Calcium: 8.2 mg/dL — ABNORMAL LOW (ref 8.4–10.5)
Chloride: 110 mEq/L (ref 96–112)
Creatinine, Ser: 1.39 mg/dL — ABNORMAL HIGH (ref 0.50–1.35)
GFR calc non Af Amer: 51 mL/min — ABNORMAL LOW (ref >90)
GFR calc non Af Amer: 50 mL/min — ABNORMAL LOW (ref >90)
GFR, EST AFRICAN AMERICAN: 59 mL/min — AB (ref >90)
GFR, EST AFRICAN AMERICAN: 59 mL/min — AB (ref >90)
Glucose, Bld: 117 mg/dL — ABNORMAL HIGH (ref 70–99)
Glucose, Bld: 118 mg/dL — ABNORMAL HIGH (ref 70–99)
POTASSIUM: 5.4 meq/L — AB (ref 3.7–5.3)
Potassium: 4.6 mEq/L (ref 3.7–5.3)
Sodium: 146 mEq/L (ref 137–147)
Sodium: 149 mEq/L — ABNORMAL HIGH (ref 137–147)

## 2013-04-15 LAB — POCT I-STAT 3, ART BLOOD GAS (G3+)
Acid-Base Excess: 1 mmol/L (ref 0.0–2.0)
BICARBONATE: 27.2 meq/L — AB (ref 20.0–24.0)
O2 SAT: 96 %
TCO2: 29 mmol/L (ref 0–100)
pCO2 arterial: 52.2 mmHg — ABNORMAL HIGH (ref 35.0–45.0)
pH, Arterial: 7.328 — ABNORMAL LOW (ref 7.350–7.450)
pO2, Arterial: 96 mmHg (ref 80.0–100.0)

## 2013-04-15 LAB — VANCOMYCIN, TROUGH: Vancomycin Tr: 9 ug/mL — ABNORMAL LOW (ref 10.0–20.0)

## 2013-04-15 LAB — PROTIME-INR
INR: 1.79 — ABNORMAL HIGH (ref 0.00–1.49)
Prothrombin Time: 20.3 seconds — ABNORMAL HIGH (ref 11.6–15.2)

## 2013-04-15 MED ORDER — OXEPA PO LIQD
1000.0000 mL | ORAL | Status: DC
  Administered 2013-04-15: 1000 mL
  Filled 2013-04-15 (×3): qty 1000

## 2013-04-15 MED ORDER — SODIUM CHLORIDE 0.9 % IV SOLN
400.0000 mg | Freq: Once | INTRAVENOUS | Status: AC
  Administered 2013-04-15: 400 mg via INTRAVENOUS
  Filled 2013-04-15: qty 4

## 2013-04-15 MED ORDER — ACETAMINOPHEN 160 MG/5ML PO SOLN: 650.0000 mg | Freq: Three times a day (TID) | ORAL | Status: DC | PRN

## 2013-04-15 MED ORDER — FREE WATER
200.0000 mL | Freq: Four times a day (QID) | Status: DC
  Administered 2013-04-16 (×2): 200 mL

## 2013-04-15 MED ORDER — WARFARIN SODIUM 5 MG PO TABS
5.0000 mg | ORAL_TABLET | Freq: Once | ORAL | Status: AC
  Administered 2013-04-15: 5 mg via ORAL
  Filled 2013-04-15: qty 1

## 2013-04-15 MED ORDER — WARFARIN - PHARMACIST DOSING INPATIENT
Freq: Every day | Status: DC
  Administered 2013-04-15 – 2013-04-22 (×4)

## 2013-04-15 MED ORDER — VANCOMYCIN HCL IN DEXTROSE 1-5 GM/200ML-% IV SOLN
1000.0000 mg | Freq: Two times a day (BID) | INTRAVENOUS | Status: DC
Start: 2013-04-15 — End: 2013-04-17
  Administered 2013-04-15 – 2013-04-17 (×5): 1000 mg via INTRAVENOUS
  Filled 2013-04-15 (×6): qty 200

## 2013-04-15 MED ORDER — IBUPROFEN 800 MG/8ML IV SOLN
400.0000 mg | Freq: Once | INTRAVENOUS | Status: AC
  Administered 2013-04-15: 400 mg via INTRAVENOUS
  Filled 2013-04-15: qty 4

## 2013-04-15 NOTE — Progress Notes (Signed)
ANTICOAGULATION CONSULT NOTE - Initial Consult  Pharmacy Consult for Coumadin Indication: atrial fibrillation  No Known Allergies  Patient Measurements: Height: 6\' 4"  (193 cm) Weight: 184 lb 1.4 oz (83.5 kg) IBW/kg (Calculated) : 86.8 Heparin Dosing Weight: n/a  Vital Signs: Temp: 100.3 F (37.9 C) (01/18 1000) Temp src: Core (Comment) (01/18 0800) BP: 139/54 mmHg (01/18 1000) Pulse Rate: 83 (01/18 1022)  Labs:  Recent Labs  04/11/2013 1840  04/13/13 0501 04/13/13 1810 04/14/13 0415 04/14/13 1110 04/15/13 0400  HGB  --   --  12.4*  --  12.6*  --  12.1*  HCT  --   --  37.1*  --  38.1*  --  36.2*  PLT  --   --  230  --  278  --  379  LABPROT  --   < > 17.9*  --  18.3*  --  20.3*  INR  --   < > 1.52*  --  1.57*  --  1.79*  CREATININE  --   --  1.60*  --  1.51*  --  1.39*  TROPONINI <0.30  --   --  <0.30  --  <0.30  --   < > = values in this interval not displayed.  Estimated Creatinine Clearance: 60.9 ml/min (by C-G formula based on Cr of 1.39).   Medical History: Past Medical History  Diagnosis Date  . 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 Last echo 03/2007 with mild AS, mild-mod AR, mild-mod MR, mild MS   . Asbestos exposure     "when I was a young man"  . Peripheral vascular disease     a) reported hx of infrainguinal arterial occlusive disease. b) Bilateral CEA  in 2001 with RCEA operation complicated by neck hematoma and l forearm hematoma/compartment syndrome s/p fasciotomy  . Hyperlipidemia   . CAD (coronary artery disease)      followed by dr Bettina Gavia.last seen 03/2011  . HTN (hypertension)     followed by dr Lelon Frohlich  . Permanent atrial fibrillation     With LBBB pattern. On Chronic Coumadin  . Heart murmur   . CHF (congestive heart failure)   . Pneumonia 03/2013    "this is my first bout  w/pneumonia" (04/06/2013)  . Exertional shortness of breath   . History of blood transfusion     "related to LUE ORs"   . Sleep apnea     "mild; doesn''t wear mask (04/19/2013)  . Basal cell carcinoma     "all over my body; arms, face, head" (04/27/2013)    Medications:  Scheduled:  . albuterol  2.5 mg Nebulization Q4H  . antiseptic oral rinse  15 mL Mouth Rinse QID  . aspirin  81 mg Oral QHS  . chlorhexidine  15 mL Mouth Rinse BID  . digoxin  125 mcg Oral Daily  . diltiazem  60 mg Per Tube Q8H  . heparin subcutaneous  5,000 Units Subcutaneous Q8H  . ibuprofen (CALDOLOR) IV  400 mg Intravenous Once  . influenza vac split quadrivalent PF  0.5 mL Intramuscular Tomorrow-1000  . oseltamivir  30 mg Oral BID  . pantoprazole (PROTONIX) IV  40 mg Intravenous Daily  . piperacillin-tazobactam (ZOSYN)  IV  3.375 g Intravenous Q8H  . pneumococcal 23 valent vaccine  0.5 mL Intramuscular Tomorrow-1000  . sodium chloride  3  mL Intravenous Q12H  . vancomycin  750 mg Intravenous Q12H    Assessment: 68 yo male on chronic Coumadin PTA for hx afib.  Home dose is 2.5 mg alternating with 5 mg every other day.  INR subtherapeutic today, last dose of Coumadin given 1/15.  CBC low but stable, no bleeding noted.  Pharmacy asked to resume Coumadin today.  Goal of Therapy:  INR 2-3 Monitor platelets by anticoagulation protocol: Yes   Plan:  1. Coumadin 5 mg po x 1 tonight. 2. Daily PT/INR.  Uvaldo Rising, BCPS  Clinical Pharmacist Pager [phone]  04/15/2013 11:28 AM

## 2013-04-15 NOTE — Consult Note (Signed)
PULMONARY / CRITICAL CARE MEDICINE  Name: Connor Ford MRN: [id number] DOB: May 22, 1945    ADMISSION DATE:  04/13/2013 CONSULTATION DATE:  04/13/2013  REFERRING MD :  [facility] PRIMARY SERVICE:  PCCM  CHIEF COMPLAINT:  Acute respiratory failure  BRIEF PATIENT DESCRIPTION: 68 y/o w/ PMHx of systolic CHF and sleep apnea admitted on 1/15 with dyspnea, fever, and pulmonary infiltrates. On 1/16, patient developed acute respiratory distress and CXR demonstrated worsening bilateral airspace disease.  PCCM was consulted.  SIGNIFICANT EVENTS / STUDIES:  1/15  Admitted for pneumonia 1/16  Transferred to ICU 1/16   Intubated  LINES / TUBES: ETT 1/16 >>> CVL 1/16 >>> R. Radial A-line 1/16 >>>  CULTURES: 1/15 Blood >>> 1/15 Urine >>> 1/15 Resp virus panel nasal swab >>> neg 1/17 Resp >>> 1/18 Resp virus panel trach aspir >>>  ANTIBIOTICS: Ceftriaxone 1/15 >>> 1/16 Azithromycin 1/15 >>> 1/16  Levaquin 1/16 >>> 1/16 Tamiflu 1/16 >>> Vancomycin 1/16 >>> Zosyn 1/16 >>>  INTERVAL HISTORY: Mild hyperkalemia (5.4) overnight. Leukocytosis resolved. Febrile overnight, Tmax 102.8. All cultures negative.   VITAL SIGNS: Temp:  [98.8 F (37.1 C)-102.4 F (39.1 C)] 102.1 F (38.9 C) (01/18 0600) Pulse Rate:  [52-112] 77 (01/18 0600) Resp:  [14-35] 34 (01/18 0600) BP: (96-153)/(36-101) 141/101 mmHg (01/18 0600) SpO2:  [90 %-93 %] 91 % (01/18 0600) Arterial Line BP: (78-171)/(36-74) 122/44 mmHg (01/18 0600) FiO2 (%):  [50 %-60 %] 50 % (01/18 0744) Weight:  [184 lb 1.4 oz (83.5 kg)] 184 lb 1.4 oz (83.5 kg) (01/18 0400) HEMODYNAMICS:   VENTILATOR SETTINGS: Vent Mode:  [-] PRVC FiO2 (%):  [50 %-60 %] 50 % Set Rate:  [35 bmp] 35 bmp Vt Set:  [530 mL] 530 mL PEEP:  [8 cmH20-10 cmH20] 10 cmH20 Plateau Pressure:  [15 cmH20-27 cmH20] 26 cmH20  INTAKE / OUTPUT: Intake/Output     01/17 0701 - 01/18 0700 01/18 0701 - 01/19 0700   P.O.     I.V. (mL/kg) 444.1 (5.3)    IV Piggyback 450    Total  Intake(mL/kg) 894.1 (10.7)    Urine (mL/kg/hr) 2175 (1.1)    Total Output 2175     Net -1280.9            PHYSICAL EXAMINATION: General:  No acute distress, on vent. Mildly diaphoretic. Neuro:  Awake, alert, oriented. RASS 0, -1. HEENT:  PERRL. Cardiovascular: Rate controlled, rhythm irregularly irregular.  Lungs: Air entry equal bilaterally. Coarse breath sounds diffusely.  Abdomen:  Soft, nontender, bowel sounds present.  Musculoskeletal:  Moves all extremities spontaneously. No edema.  Skin: Intact.  LABS: CBC  Recent Labs Lab 04/13/13 0501 04/14/13 0415 04/15/13 0400  WBC 13.7* 15.1* 9.6  HGB 12.4* 12.6* 12.1*  HCT 37.1* 38.1* 36.2*  PLT 230 278 379   Coag's  Recent Labs Lab 04/13/13 0501 04/14/13 0415 04/15/13 0400  INR 1.52* 1.57* 1.79*   BMET  Recent Labs Lab 04/13/13 0501 04/14/13 0415 04/15/13 0400  NA 141 142 146  K 5.3 5.3 5.4*  CL 105 105 105  CO2 23 20 25   BUN 38* 33* 39*  CREATININE 1.60* 1.51* 1.39*  GLUCOSE 109* 133* 117*   Electrolytes  Recent Labs Lab 04/11/2013 1724 04/07/2013 2245 04/13/13 0501 04/14/13 0415 04/15/13 0400  CALCIUM 8.5  --  8.5 8.1* 8.4  MG  --  2.0  --   --   --   PHOS  --  4.0  --   --   --  Sepsis Markers  Recent Labs Lab 12-May-2013 1746 04/13/13 1810 04/14/13 0421  LATICACIDVEN 2.22* 1.5 1.4   ABG  Recent Labs Lab 04/14/13 0007 04/14/13 0356 04/14/13 1712  PHART 7.194* 7.311* 7.349*  PCO2ART 69.3* 47.1* 46.5*  PO2ART 248.0* 150.0* 115.0*   Liver Enzymes  Recent Labs Lab 2013/05/12 1848 04/13/13 0501 04/14/13 0415  AST 84* 110* 151*  ALT 61* 71* 104*  ALKPHOS 93 100 110  BILITOT 0.9 0.8 0.9  ALBUMIN 2.5* 2.2* 2.2*   Cardiac Enzymes  Recent Labs Lab 05-12-2013 1724 2013/05/12 1840 May 12, 2013 2245 04/13/13 1810 04/14/13 1110  TROPONINI  --  <0.30  --  <0.30 <0.30  PROBNP 4075.0*  --  2568.0*  --   --    Glucose  Recent Labs Lab 04/14/13 1127 04/14/13 1528 04/14/13 2003  04/14/13 2349 04/15/13 0403 04/15/13 0717  GLUCAP 118* 117* 130* 118* 113* 127*   CXR:  1/18 >>> relatively unchanged from previous. Bilateral infiltrates/ARDS picture.   ASSESSMENT / PLAN: Principal Problem:   Acute respiratory failure Active Problems:   HYPERLIPIDEMIA   HYPERTENSION   CAP (community acquired pneumonia)   Leukocytosis   Transaminitis   Acute renal failure   ARDS (adult respiratory distress syndrome)   PULMONARY A:   Acute hypoxemic respiratory failure. CAP. Possible influenza pneumonitis. P:   Goal SpO2>92, pH>7.30 Intubated.  Daily SBT.  VAP bundle.  CXR in AM.  CARDIOVASCULAR A:  Acute on chronic systolic congestive heart failure. SIRS / sepsis. Resolving.  ECHO shows normal EF.  Mod mitral stenosis, pulm HTN 35mmHg PAP AF. Rate controlled. On Coumadin at home. P:  Goal MAP > 65 Troponin -ve x3 Continue ASA, Digoxin, Cardizem Hold Lipitor as elevated transaminases Hold ACEI in setting of AKI Avoid Amiodarone for now in setting of ARDS. Restart Coumadin per pharmacy  RENAL A:   AKI. Mild hyperkalemia, 5.4. P:   Hold ACEI Repeat BMP in PM  GASTROINTESTINAL A:   Elevated transaminases, likely 2/2 shock liver. Hepatitis panel -ve. GI PPx. P:   Starting tube feeds today. Continue Protonix Repeat LFT's in AM D/c Tylenol, give 1 dose Caldolor IV for fever  HEMATOLOGIC A:   Mild anemia. AF, on Coumadin at home VTE Px. P:  Trend CBC Restart Coumadin per pharmacy Heparin [place]  INFECTIOUS A:   Suspected CAP. Suspected influenza. Viral panel nasal swab -ve. P:   Abx; Vancomycin + Zosyn + Tamiflu Repeated viral panel w/ tracheal aspirate today  ENDOCRINE  A:   Normoglycemia.   P:   Monitor glucose on BMP  NEUROLOGIC A:   Sedated. RASS 0, -1. P:   Continue Fentanyl gtt    Signed: Luanne Bras, MD 04/15/2013 10:20 AM  CC40 Pt seen and examined with Dr Ronnald Ramp. I agree with the above note.  Plan to wean vent, start  coumadin, hold diuretics, cont ABX.  prob d/c vent by 04/16/13  Mariel Sleet Beeper  [phone]  Cell  [phone]  If no response or cell goes to voicemail, call beeper [phone]

## 2013-04-16 ENCOUNTER — Inpatient Hospital Stay (HOSPITAL_COMMUNITY): Payer: Medicare PPO

## 2013-04-16 LAB — URINE MICROSCOPIC-ADD ON

## 2013-04-16 LAB — COMPREHENSIVE METABOLIC PANEL
ALBUMIN: 1.9 g/dL — AB (ref 3.5–5.2)
ALK PHOS: 85 U/L (ref 39–117)
ALT: 59 U/L — ABNORMAL HIGH (ref 0–53)
AST: 69 U/L — AB (ref 0–37)
BILIRUBIN TOTAL: 0.5 mg/dL (ref 0.3–1.2)
BUN: 39 mg/dL — ABNORMAL HIGH (ref 6–23)
CHLORIDE: 114 meq/L — AB (ref 96–112)
CO2: 28 mEq/L (ref 19–32)
CREATININE: 1.35 mg/dL (ref 0.50–1.35)
Calcium: 8.3 mg/dL — ABNORMAL LOW (ref 8.4–10.5)
GFR calc Af Amer: 61 mL/min — ABNORMAL LOW (ref >90)
GFR, EST NON AFRICAN AMERICAN: 53 mL/min — AB (ref >90)
Glucose, Bld: 129 mg/dL — ABNORMAL HIGH (ref 70–99)
POTASSIUM: 4.7 meq/L (ref 3.7–5.3)
Sodium: 151 mEq/L — ABNORMAL HIGH (ref 137–147)
Total Protein: 6.4 g/dL (ref 6.0–8.3)

## 2013-04-16 LAB — RESPIRATORY VIRUS PANEL
Adenovirus: NOT DETECTED
INFLUENZA A H1: NOT DETECTED
Influenza A H3: DETECTED — AB
Influenza A: DETECTED — AB
Influenza B: NOT DETECTED
METAPNEUMOVIRUS: NOT DETECTED
Parainfluenza 1: NOT DETECTED
Parainfluenza 2: NOT DETECTED
Parainfluenza 3: NOT DETECTED
RHINOVIRUS: NOT DETECTED
Respiratory Syncytial Virus A: NOT DETECTED
Respiratory Syncytial Virus B: NOT DETECTED

## 2013-04-16 LAB — GLUCOSE, CAPILLARY
GLUCOSE-CAPILLARY: 124 mg/dL — AB (ref 70–99)
GLUCOSE-CAPILLARY: 108 mg/dL — AB (ref 70–99)
GLUCOSE-CAPILLARY: 102 mg/dL — AB (ref 70–99)
Glucose-Capillary: 105 mg/dL — ABNORMAL HIGH (ref 70–99)
Glucose-Capillary: 114 mg/dL — ABNORMAL HIGH (ref 70–99)
Glucose-Capillary: 144 mg/dL — ABNORMAL HIGH (ref 70–99)

## 2013-04-16 LAB — URINALYSIS, ROUTINE W REFLEX MICROSCOPIC
Bilirubin Urine: NEGATIVE
GLUCOSE, UA: NEGATIVE mg/dL
KETONES UR: NEGATIVE mg/dL
Nitrite: NEGATIVE
PH: 5 (ref 5.0–8.0)
Protein, ur: NEGATIVE mg/dL
SPECIFIC GRAVITY, URINE: 1.023 (ref 1.005–1.030)
Urobilinogen, UA: 1 mg/dL (ref 0.0–1.0)

## 2013-04-16 LAB — PROTIME-INR
INR: 1.86 — ABNORMAL HIGH (ref 0.00–1.49)
PROTHROMBIN TIME: 20.9 s — AB (ref 11.6–15.2)

## 2013-04-16 LAB — CBC
HCT: 35.9 % — ABNORMAL LOW (ref 39.0–52.0)
HEMOGLOBIN: 11.2 g/dL — AB (ref 13.0–17.0)
MCH: 31.5 pg (ref 26.0–34.0)
MCHC: 31.2 g/dL (ref 30.0–36.0)
MCV: 100.8 fL — AB (ref 78.0–100.0)
Platelets: 308 10*3/uL (ref 150–400)
RBC: 3.56 MIL/uL — AB (ref 4.22–5.81)
RDW: 15.5 % (ref 11.5–15.5)
WBC: 8.4 10*3/uL (ref 4.0–10.5)

## 2013-04-16 LAB — MAGNESIUM: MAGNESIUM: 2.9 mg/dL — AB (ref 1.5–2.5)

## 2013-04-16 LAB — POCT I-STAT 3, ART BLOOD GAS (G3+)
Acid-Base Excess: 4 mmol/L — ABNORMAL HIGH (ref 0.0–2.0)
Bicarbonate: 28.4 mEq/L — ABNORMAL HIGH (ref 20.0–24.0)
O2 Saturation: 89 %
PH ART: 7.425 (ref 7.350–7.450)
TCO2: 30 mmol/L (ref 0–100)
pCO2 arterial: 43.6 mmHg (ref 35.0–45.0)
pO2, Arterial: 56 mmHg — ABNORMAL LOW (ref 80.0–100.0)

## 2013-04-16 MED ORDER — ACETAMINOPHEN 160 MG/5ML PO SOLN
650.0000 mg | Freq: Three times a day (TID) | ORAL | Status: DC | PRN
  Administered 2013-04-16: 650 mg via ORAL
  Filled 2013-04-16: qty 20.3

## 2013-04-16 MED ORDER — PRO-STAT SUGAR FREE PO LIQD
30.0000 mL | Freq: Three times a day (TID) | ORAL | Status: DC
Start: 2013-04-16 — End: 2013-04-16
  Filled 2013-04-16 (×3): qty 30

## 2013-04-16 MED ORDER — VITAMIN B-1 100 MG PO TABS
100.0000 mg | ORAL_TABLET | Freq: Every day | ORAL | Status: DC
  Administered 2013-04-16: 100 mg via ORAL
  Filled 2013-04-16 (×2): qty 1

## 2013-04-16 MED ORDER — BIOTENE DRY MOUTH MT LIQD
15.0000 mL | Freq: Four times a day (QID) | OROMUCOSAL | Status: DC
  Administered 2013-04-16 – 2013-05-02 (×44): 15 mL via OROMUCOSAL

## 2013-04-16 MED ORDER — OXEPA PO LIQD
1000.0000 mL | ORAL | Status: DC
  Filled 2013-04-16 (×2): qty 1000

## 2013-04-16 MED ORDER — ADULT MULTIVITAMIN W/MINERALS CH
1.0000 | ORAL_TABLET | Freq: Every day | ORAL | Status: DC
  Administered 2013-04-16: 1 via ORAL
  Filled 2013-04-16 (×2): qty 1

## 2013-04-16 MED ORDER — FOLIC ACID 1 MG PO TABS
1.0000 mg | ORAL_TABLET | Freq: Every day | ORAL | Status: DC
  Administered 2013-04-16: 1 mg via ORAL
  Filled 2013-04-16 (×2): qty 1

## 2013-04-16 MED ORDER — DILTIAZEM HCL 60 MG PO TABS
60.0000 mg | ORAL_TABLET | Freq: Three times a day (TID) | ORAL | Status: DC
  Administered 2013-04-16 (×2): 60 mg via ORAL
  Filled 2013-04-16 (×6): qty 1

## 2013-04-16 MED ORDER — OSELTAMIVIR PHOSPHATE 75 MG PO CAPS
75.0000 mg | ORAL_CAPSULE | Freq: Two times a day (BID) | ORAL | Status: DC
  Administered 2013-04-16: 75 mg via ORAL
  Filled 2013-04-16 (×3): qty 1

## 2013-04-16 MED ORDER — ACETAMINOPHEN 325 MG PO TABS: 650.0000 mg | ORAL_TABLET | Freq: Three times a day (TID) | ORAL | Status: DC | PRN

## 2013-04-16 MED ORDER — WARFARIN SODIUM 5 MG PO TABS
5.0000 mg | ORAL_TABLET | Freq: Once | ORAL | Status: AC
  Administered 2013-04-16: 5 mg via ORAL
  Filled 2013-04-16: qty 1

## 2013-04-16 MED ORDER — DILTIAZEM 12 MG/ML ORAL SUSPENSION
60.0000 mg | Freq: Three times a day (TID) | ORAL | Status: DC
  Filled 2013-04-16 (×3): qty 6

## 2013-04-16 MED ORDER — CHLORHEXIDINE GLUCONATE 0.12 % MT SOLN
15.0000 mL | Freq: Two times a day (BID) | OROMUCOSAL | Status: DC
  Administered 2013-04-16 – 2013-05-02 (×26): 15 mL via OROMUCOSAL
  Filled 2013-04-16 (×34): qty 15

## 2013-04-16 MED ORDER — ALBUTEROL SULFATE (2.5 MG/3ML) 0.083% IN NEBU
2.5000 mg | INHALATION_SOLUTION | Freq: Four times a day (QID) | RESPIRATORY_TRACT | Status: DC
  Administered 2013-04-16 – 2013-04-18 (×6): 2.5 mg via RESPIRATORY_TRACT
  Filled 2013-04-16 (×7): qty 3

## 2013-04-16 MED ORDER — DEXTROSE 5 % IV SOLN
INTRAVENOUS | Status: DC
  Administered 2013-04-16 – 2013-04-18 (×3): via INTRAVENOUS

## 2013-04-16 MED ORDER — LORAZEPAM 2 MG/ML IJ SOLN
2.0000 mg | INTRAMUSCULAR | Status: DC | PRN
  Administered 2013-04-16 – 2013-04-17 (×3): 1 mg via INTRAVENOUS
  Filled 2013-04-16 (×2): qty 1

## 2013-04-16 NOTE — Procedures (Signed)
Extubation Procedure Note  Patient Details:   Name: Connor Ford DOB: 10-17-1945 MRN: [id number]   Airway Documentation:     Evaluation  O2 sats: stable throughout Complications: No apparent complications Patient did tolerate procedure well. Bilateral Breath Sounds: Other (Comment) (coarse) Suctioning: Airway Yes Cuff leak positive  Able to vocalize post extubation I.S. 2.5L  Revonda Standard 04/16/2013, 12:39 PM

## 2013-04-16 NOTE — Progress Notes (Signed)
Jerking extremities, tremors, mentation seems appropriate, but patient is labile and easily agitated, No complaints of nausea, however complaining of headachel. Residents contacted for order. ? Withdrawal symptoms. Per patient's girlfriend patient drinks bourbons a day. Last drink was the day of admission.

## 2013-04-16 NOTE — Consult Note (Deleted)
PULMONARY / CRITICAL CARE MEDICINE  Name: Connor Ford MRN: [id number] DOB: 12/11/1945    ADMISSION DATE:  04/03/2013 CONSULTATION DATE:  04/13/2013  REFERRING MD :  [facility] PRIMARY SERVICE:  PCCM  CHIEF COMPLAINT:  Acute respiratory failure  BRIEF PATIENT DESCRIPTION: 68 y/o w/ PMHx of systolic CHF and sleep apnea admitted on 1/15 with dyspnea, fever, and pulmonary infiltrates. On 1/16, patient developed acute respiratory distress and CXR demonstrated worsening bilateral airspace disease.  PCCM was consulted.  SIGNIFICANT EVENTS / STUDIES:  1/15  Admitted for pneumonia 1/16  Transferred to ICU 1/16   Intubated 1/19  Extubated  LINES / TUBES: ETT 1/16 >>> 1/19 CVL 1/16 >>> R. Radial A-line 1/16 >>>  CULTURES: 1/15 Blood >>> 1/15 Urine >>> 1/15 Resp virus panel nasal swab >>> neg 1/17 Resp >>> 1/18 Resp virus panel trach aspir >>> 1/19 Blood >>>  ANTIBIOTICS: Ceftriaxone 1/15 >>> 1/16 Azithromycin 1/15 >>> 1/16  Levaquin 1/16 >>> 1/16 Tamiflu 1/16 >>> 1/19 Vancomycin 1/16 >>>  Zosyn 1/16 >>>  INTERVAL HISTORY: Continued fever overnight despite -ve cultures. Repeated blood cultures. No further issues. Extubated successfully.  VITAL SIGNS: Temp:  [98.9 F (37.2 C)-103.1 F (39.5 C)] 102.5 F (39.2 C) (01/19 0900) Pulse Rate:  [68-251] 81 (01/19 0900) Resp:  [12-26] 19 (01/19 0900) BP: (96-143)/(44-88) 125/54 mmHg (01/19 0900) SpO2:  [88 %-96 %] 92 % (01/19 1111) Arterial Line BP: (107-169)/(38-50) 156/45 mmHg (01/19 0900) FiO2 (%):  [40 %] 40 % (01/19 1111) Weight:  [183 lb 3.2 oz (83.1 kg)] 183 lb 3.2 oz (83.1 kg) (01/19 0401)  HEMODYNAMICS:   VENTILATOR SETTINGS: Vent Mode:  [-] PRVC FiO2 (%):  [40 %] 40 % Set Rate:  [22 bmp-26 bmp] 22 bmp Vt Set:  [530 mL] 530 mL PEEP:  [8 cmH20] 8 cmH20 Plateau Pressure:  [16 cmH20-20 cmH20] 18 cmH20  INTAKE / OUTPUT: Intake/Output     01/18 0701 - 01/19 0700 01/19 0701 - 01/20 0700   I.V. (mL/kg) 755.7 (9.1) 80 (1)    NG/GT 902.2 80   IV Piggyback 641.5    Total Intake(mL/kg) 2299.3 (27.7) 160 (1.9)   Urine (mL/kg/hr) 1610 (0.8) 325 (0.6)   Total Output 1610 325   Net +689.3 -165          PHYSICAL EXAMINATION: General:  No acute distress. Mildly diaphoretic. Neuro:  Awake, alert. Moves all limbs spontaneously. Follows commands. HEENT: PERRL. Cardiovascular: Rate controlled, rhythm irregularly irregular. 2/6 systolic murmur.  Lungs: Air entry equal bilaterally. Coarse breath sounds diffusely.  Abdomen:  Soft, nontender, bowel sounds present.  Musculoskeletal:  Moves all extremities spontaneously. No edema.  Skin: Intact. Chronic venous stasis changes in LE's.  LABS:  CBC  Recent Labs Lab 04/14/13 0415 04/15/13 0400 04/16/13 0400  WBC 15.1* 9.6 8.4  HGB 12.6* 12.1* 11.2*  HCT 38.1* 36.2* 35.9*  PLT 278 379 308   Coag's  Recent Labs Lab 04/14/13 0415 04/15/13 0400 04/16/13 0400  INR 1.57* 1.79* 1.86*   BMET  Recent Labs Lab 04/15/13 0400 04/15/13 1650 04/16/13 0400  NA 146 149* 151*  K 5.4* 4.6 4.7  CL 105 110 114*  CO2 25 27 28   BUN 39* 39* 39*  CREATININE 1.39* 1.40* 1.35  GLUCOSE 117* 118* 129*   Electrolytes  Recent Labs Lab 03/31/2013 1724 04/26/2013 2245  04/15/13 0400 04/15/13 1650 04/16/13 0400  CALCIUM 8.5  --   < > 8.4 8.2* 8.3*  MG  --  2.0  --   --   --  2.9*  PHOS  --  4.0  --   --   --   --   < > = values in this interval not displayed.  Sepsis Markers  Recent Labs Lab April 20, 2013 1746 04/13/13 1810 04/14/13 0421  LATICACIDVEN 2.22* 1.5 1.4   ABG  Recent Labs Lab 04/14/13 0356 04/14/13 1712 04/15/13 1304  PHART 7.311* 7.349* 7.328*  PCO2ART 47.1* 46.5* 52.2*  PO2ART 150.0* 115.0* 96.0   Liver Enzymes  Recent Labs Lab 04/13/13 0501 04/14/13 0415 04/16/13 0400  AST 110* 151* 69*  ALT 71* 104* 59*  ALKPHOS 100 110 85  BILITOT 0.8 0.9 0.5  ALBUMIN 2.2* 2.2* 1.9*   Cardiac Enzymes  Recent Labs Lab 2013/04/20 1724  2013-04-20 1840 04-20-13 2245 04/13/13 1810 04/14/13 1110  TROPONINI  --  <0.30  --  <0.30 <0.30  PROBNP 4075.0*  --  2568.0*  --   --    Glucose  Recent Labs Lab 04/15/13 1527 04/15/13 2006 04/16/13 0008 04/16/13 0352 04/16/13 0804 04/16/13 1136  GLUCAP 114* 134* 114* 124* 108* 102*   CXR:  1/19 >>> Relatively unchanged from previous. Bilateral infiltrates/ARDS picture.   ASSESSMENT / PLAN:  PULMONARY A:   Acute hypoxemic respiratory failure. CAP. Possible influenza pneumonitis. P:   Extubated. Continue to monitor  CARDIOVASCULAR A:  Acute on chronic systolic congestive heart failure. ECHO shows normal EF.  Mod mitral stenosis, pulm HTN 85mmHg PAP AF. Rate controlled. On Coumadin at home. P:  Goal MAP > 65 Continue ASA, Digoxin, Cardizem Hold Lipitor as elevated transaminases Hold ACEI in setting of AKI Avoid Amiodarone for now in setting of ARDS. Continue Coumadin  RENAL A:   AKI. Resolving Hypernatremia, 151. P:   D5W @ 75 ml/hr Hold ACEI Trend BMP   GASTROINTESTINAL A:   Elevated transaminases, likely 2/2 shock liver. Resolving. Hepatitis panel -ve. GI PPx. P:   RN swallow eval; start full liquid diet, advance as tolerated. D/c protonix Restarted Tylenol 650 mg q8h prn for fever given recent LFTs Trend CMP  HEMATOLOGIC A:   Mild anemia. AF, on Coumadin at home. No leukocytosis. VTE Px. P:  Trend CBC Continue Coumadin Heparin [place]  INFECTIOUS A:   Suspected CAP vs influenza. Viral panel nasal swab -ve. Continued fever. P:   Abx; Vancomycin + Zosyn D/c Tamiflu Cultures negative to date F/u resp virus panel from tracheal aspirate Blood cultures repeated overnight Procalcitonin + CBC w/ diff to workup fever.  ENDOCRINE  A:   Normoglycemia.   P:   Monitor glucose on BMP  NEUROLOGIC A:   No issues. P:   Continue to monitor    Signed: Luanne Bras, MD 04/16/2013 1:13 PM

## 2013-04-16 NOTE — Progress Notes (Signed)
PULMONARY / CRITICAL CARE MEDICINE  Name: Connor Ford MRN: [id number] DOB: 1946/02/07    ADMISSION DATE:  04/06/2013 CONSULTATION DATE:  04/13/2013  REFERRING MD :  [facility] PRIMARY SERVICE:  PCCM  CHIEF COMPLAINT:  Acute respiratory failure  BRIEF PATIENT DESCRIPTION: 68 y/o w/ PMHx of systolic CHF and sleep apnea admitted on 1/15 with dyspnea, fever, and pulmonary infiltrates. On 1/16, patient developed acute respiratory distress and CXR demonstrated worsening bilateral airspace disease.  PCCM was consulted.  SIGNIFICANT EVENTS / STUDIES:  1/15  Admitted for pneumonia 1/16  Transferred to ICU 1/16   Intubated 1/19  Extubated  LINES / TUBES: ETT 1/16 >>> 1/19 CVL 1/16 >>> R. Radial A-line 1/16 >>>  CULTURES: 1/15 Blood >>> 1/15 Urine >>> 1/15 Resp virus panel nasal swab >>> neg 1/17 Resp >>> 1/18 Resp virus panel trach aspir >>> 1/19 Blood >>>  ANTIBIOTICS: Ceftriaxone 1/15 >>> 1/16 Azithromycin 1/15 >>> 1/16  Levaquin 1/16 >>> 1/16 Tamiflu 1/16 >>> 1/19 Vancomycin 1/16 >>>  Zosyn 1/16 >>>  INTERVAL HISTORY: Continued fever overnight despite -ve cultures. Repeated blood cultures. No further issues. Extubated successfully.  VITAL SIGNS: Temp:  [99.3 F (37.4 C)-103.1 F (39.5 C)] 102.5 F (39.2 C) (01/19 0900) Pulse Rate:  [68-251] 81 (01/19 0900) Resp:  [12-26] 19 (01/19 0900) BP: (96-143)/(44-88) 125/54 mmHg (01/19 0900) SpO2:  [88 %-96 %] 92 % (01/19 1111) Arterial Line BP: (107-169)/(38-50) 156/45 mmHg (01/19 0900) FiO2 (%):  [40 %] 40 % (01/19 1111) Weight:  [83.1 kg (183 lb 3.2 oz)] 83.1 kg (183 lb 3.2 oz) (01/19 0401)  HEMODYNAMICS:   VENTILATOR SETTINGS: Vent Mode:  [-] PRVC FiO2 (%):  [40 %] 40 % Set Rate:  [22 bmp-26 bmp] 22 bmp Vt Set:  [530 mL] 530 mL PEEP:  [8 cmH20] 8 cmH20 Plateau Pressure:  [16 cmH20-20 cmH20] 18 cmH20  INTAKE / OUTPUT: Intake/Output     01/18 0701 - 01/19 0700 01/19 0701 - 01/20 0700   I.V. (mL/kg) 755.7 (9.1) 80 (1)    NG/GT 902.2 80   IV Piggyback 641.5    Total Intake(mL/kg) 2299.3 (27.7) 160 (1.9)   Urine (mL/kg/hr) 1610 (0.8) 325 (0.5)   Total Output 1610 325   Net +689.3 -165          PHYSICAL EXAMINATION: General:  No acute distress. Mildly diaphoretic. Neuro:  Awake, alert. Moves all limbs spontaneously. Follows commands. HEENT: PERRL. Cardiovascular: Rate controlled, rhythm irregularly irregular. 2/6 systolic murmur.  Lungs: Air entry equal bilaterally. Coarse breath sounds diffusely.  Abdomen:  Soft, nontender, bowel sounds present.  Musculoskeletal:  Moves all extremities spontaneously. No edema.  Skin: Intact. Chronic venous stasis changes in LE's.  LABS:  CBC  Recent Labs Lab 04/14/13 0415 04/15/13 0400 04/16/13 0400  WBC 15.1* 9.6 8.4  HGB 12.6* 12.1* 11.2*  HCT 38.1* 36.2* 35.9*  PLT 278 379 308   Coag's  Recent Labs Lab 04/14/13 0415 04/15/13 0400 04/16/13 0400  INR 1.57* 1.79* 1.86*   BMET  Recent Labs Lab 04/15/13 0400 04/15/13 1650 04/16/13 0400  NA 146 149* 151*  K 5.4* 4.6 4.7  CL 105 110 114*  CO2 25 27 28   BUN 39* 39* 39*  CREATININE 1.39* 1.40* 1.35  GLUCOSE 117* 118* 129*   Electrolytes  Recent Labs Lab 04/11/2013 1724 04/28/2013 2245  04/15/13 0400 04/15/13 1650 04/16/13 0400  CALCIUM 8.5  --   < > 8.4 8.2* 8.3*  MG  --  2.0  --   --   --  2.9*  PHOS  --  4.0  --   --   --   --   < > = values in this interval not displayed.  Sepsis Markers  Recent Labs Lab 04/04/2013 1746 04/13/13 1810 04/14/13 0421  LATICACIDVEN 2.22* 1.5 1.4   ABG  Recent Labs Lab 04/14/13 0356 04/14/13 1712 04/15/13 1304  PHART 7.311* 7.349* 7.328*  PCO2ART 47.1* 46.5* 52.2*  PO2ART 150.0* 115.0* 96.0   Liver Enzymes  Recent Labs Lab 04/13/13 0501 04/14/13 0415 04/16/13 0400  AST 110* 151* 69*  ALT 71* 104* 59*  ALKPHOS 100 110 85  BILITOT 0.8 0.9 0.5  ALBUMIN 2.2* 2.2* 1.9*   Cardiac Enzymes  Recent Labs Lab 04/20/2013 1724  04/14/2013 1840 04/08/2013 2245 04/13/13 1810 04/14/13 1110  TROPONINI  --  <0.30  --  <0.30 <0.30  PROBNP 4075.0*  --  2568.0*  --   --    Glucose  Recent Labs Lab 04/15/13 1527 04/15/13 2006 04/16/13 0008 04/16/13 0352 04/16/13 0804 04/16/13 1136  GLUCAP 114* 134* 114* 124* 108* 102*   CXR:  1/19 >>> Relatively unchanged from previous. Bilateral infiltrates/ARDS picture.   ASSESSMENT / PLAN:  PULMONARY A:   Acute hypoxemic respiratory failure. CAP. Possible influenza pneumonitis. P:   Extubated. Continue to monitor  CARDIOVASCULAR A:  Acute on chronic systolic congestive heart failure. ECHO shows normal EF.  Mod mitral stenosis, pulm HTN 47mmHg PAP AF. Rate controlled. On Coumadin at home. P:  Goal MAP > 65 Continue ASA, Digoxin, Cardizem Hold Lipitor as elevated transaminases Hold ACEI in setting of AKI Avoid Amiodarone for now in setting of ARDS. Continue Coumadin  RENAL A:   AKI. Resolving Hypernatremia, 151. P:   D5W @ 75 ml/hr Hold ACEI Trend BMP   GASTROINTESTINAL A:   Elevated transaminases, likely 2/2 shock liver. Resolving. Hepatitis panel -ve. GI PPx. P:   RN swallow eval; start full liquid diet, advance as tolerated. D/c protonix Restarted Tylenol 650 mg q8h prn for fever given recent LFTs Trend CMP  HEMATOLOGIC A:   Mild anemia. AF, on Coumadin at home. No leukocytosis. VTE Px. P:  Trend CBC Continue Coumadin Heparin [place]  INFECTIOUS A:   Suspected CAP vs influenza. Viral panel nasal swab -ve. Continued fever. P:   Abx; Vancomycin + Zosyn D/c Tamiflu Cultures negative to date F/u resp virus panel from tracheal aspirate Blood cultures repeated overnight Procalcitonin + CBC w/ diff to workup fever.  ENDOCRINE  A:   Normoglycemia.   P:   Monitor glucose on BMP  NEUROLOGIC A:   No issues. P:   Continue to monitor    Signed: Luanne Bras, MD  04/16/2013  1:13 PM   PCCM ATTENDING: I have interviewed and  examined the patient and reviewed the database. I have formulated the assessment and plan as reflected in the note above with amendments made by me. 40 mins of direct critical care time provided  Merton Border, MD;  PCCM service; Mobile [phone]

## 2013-04-16 NOTE — Progress Notes (Signed)
ANTICOAGULATION CONSULT NOTE - Follow Up Consult  Pharmacy Consult for Coumadin Indication: atrial fibrillation  No Known Allergies  Patient Measurements: Height: 6\' 4"  (193 cm) Weight: 183 lb 3.2 oz (83.1 kg) IBW/kg (Calculated) : 86.8 Heparin Dosing Weight: n/a  Vital Signs: Temp: 102.5 F (39.2 C) (01/19 0900) Temp src: Core (Comment) (01/19 0900) BP: 125/54 mmHg (01/19 0900) Pulse Rate: 81 (01/19 0900)  Labs:  Recent Labs  04/13/13 1810  04/14/13 0415 04/14/13 1110 04/15/13 0400 04/15/13 1650 04/16/13 0400  HGB  --   < > 12.6*  --  12.1*  --  11.2*  HCT  --   --  38.1*  --  36.2*  --  35.9*  PLT  --   --  278  --  379  --  308  LABPROT  --   --  18.3*  --  20.3*  --  20.9*  INR  --   --  1.57*  --  1.79*  --  1.86*  CREATININE  --   < > 1.51*  --  1.39* 1.40* 1.35  TROPONINI <0.30  --   --  <0.30  --   --   --   < > = values in this interval not displayed.  Estimated Creatinine Clearance: 62.4 ml/min (by C-G formula based on Cr of 1.35).   Assessment: 68 yo male with a history of atrial fibrillation and on chronic Coumadin therapy prior to admission. Home dose of Coumadin is 2.5 mg daily alternating with 5 mg every other day. After one 5 mg dose of Coumadin, INR is 1.86 with a slight increase in 24 hours from 1.79 as expected. Patient continues to be on heparin 5,000 Units subcutaneously for DVT prophylaxis. CBC continues to be low but no bleeding noted.       Goal of Therapy:  INR 2-3 Monitor platelets by anticoagulation protocol: Yes   Plan:  1. Continue Coumadin 5 mg po X 1 tonight  2. Monitor daily PT/INR adjusting Coumadin dose accordingly  3. Monitor CBC and signs of bleed  4. Discontinue heparin subcutaneously once INR>2  Baker,Katherine PharmD Candidate  04/16/2013,11:57 AM  I agree with above.  Sherlon Handing, PharmD, BCPS Clinical pharmacist, pager [phone] 04/16/2013  2:48 PM

## 2013-04-16 NOTE — Progress Notes (Signed)
NUTRITION FOLLOW UP  Intervention:    Increase Oxepa by 10 ml every 4 hours to goal rate of 60 ml/h with Prostat 30 ml TID to provide 2460 kcals, 135 gm protein, 1130 ml free water daily.  Nutrition Dx:   Inadequate oral intake related to inability to eat as evidenced by NPO status. Ongoing.  Goal:   Intake to meet >90% of estimated nutrition needs.  Monitor:   TF tolerance/adequacy, weight trend, labs, vent status.  Assessment:   Pt with complex medical history including atrial fibrillation on Coumadin and amiodarone, digoxin and Diltiazem, HTN, HLD, systolic CHF, presented with main concern of several days duration of progressively worsening shortness of breath that initially started with exertion and now present at rest. Pt explains he has seen his PCP and was diagnosed with PNA, and was started on Levaquin but his symptoms did not improve.   Had rapid response on 1/16 for acute respiratory distress, was intubated on 1/16. Continues on ARDS protocol. Is currently receiving TF via OGT with Oxepa at 40 ml/h, providing 1440 kcals, 60 gm protein, 754 ml free water daily.  Patient is currently intubated on ventilator support.  MV: 11.2, 13.9 L/min Temp (24hrs), Avg:101.1 F (38.4 C), Min:98.9 F (37.2 C), Max:103.1 F (39.5 C)   Height: Ht Readings from Last 1 Encounters:  04/13/13 6\' 4"  (1.93 m)    Weight Status:   Wt Readings from Last 1 Encounters:  04/16/13 183 lb 3.2 oz (83.1 kg)  04/14/13  200 lb 2.8 oz (90.8 kg)  Re-estimated needs:  Kcal: 2420 Protein: 115-135 gm Fluid: 2.4 L  Skin: no wounds  Diet Order: NPO   Intake/Output Summary (Last 24 hours) at 04/16/13 1111 Last data filed at 04/16/13 0900  Gross per 24 hour  Intake 2275.74 ml  Output   1710 ml  Net 565.74 ml    Last BM: 1/15   Labs:   Recent Labs Lab 04/21/2013 1724 04/11/2013 2245  04/15/13 0400 04/15/13 1650 04/16/13 0400  NA 140  --   < > 146 149* 151*  K 4.9  --   < > 5.4* 4.6 4.7   CL 102  --   < > 105 110 114*  CO2 21  --   < > 25 27 28   BUN 40*  --   < > 39* 39* 39*  CREATININE 1.96*  --   < > 1.39* 1.40* 1.35  CALCIUM 8.5  --   < > 8.4 8.2* 8.3*  MG  --  2.0  --   --   --  2.9*  PHOS  --  4.0  --   --   --   --   GLUCOSE 132*  --   < > 117* 118* 129*  < > = values in this interval not displayed.  CBG (last 3)   Recent Labs  04/16/13 0008 04/16/13 0352 04/16/13 0804  GLUCAP 114* 124* 108*    Scheduled Meds: . albuterol  2.5 mg Nebulization Q4H  . antiseptic oral rinse  15 mL Mouth Rinse QID  . antiseptic oral rinse  15 mL Mouth Rinse QID  . aspirin  81 mg Oral QHS  . chlorhexidine  15 mL Mouth Rinse BID  . chlorhexidine  15 mL Mouth Rinse BID  . digoxin  125 mcg Oral Daily  . diltiazem  60 mg Per Tube Q8H  . free water  200 mL Per Tube Q6H  . heparin subcutaneous  5,000 Units Subcutaneous  Q8H  . influenza vac split quadrivalent PF  0.5 mL Intramuscular Tomorrow-1000  . oseltamivir  30 mg Oral BID  . pantoprazole (PROTONIX) IV  40 mg Intravenous Daily  . piperacillin-tazobactam (ZOSYN)  IV  3.375 g Intravenous Q8H  . pneumococcal 23 valent vaccine  0.5 mL Intramuscular Tomorrow-1000  . sodium chloride  3 mL Intravenous Q12H  . vancomycin  1,000 mg Intravenous Q12H  . Warfarin - Pharmacist Dosing Inpatient   Does not apply q1800    Continuous Infusions: . feeding supplement (OXEPA) 1,000 mL (04/15/13 1955)  . fentaNYL infusion INTRAVENOUS 125 mcg/hr (04/16/13 1032)    Molli Barrows, RD, LDN, [place] Pager [phone] After Hours Pager [phone]

## 2013-04-16 NOTE — Progress Notes (Addendum)
No cooling blanket available fm portable equipment. Will use Ice packs instead. Will continue to assess Pt for tolerance of ice packs and foley core temp.

## 2013-04-17 ENCOUNTER — Inpatient Hospital Stay (HOSPITAL_COMMUNITY): Payer: Medicare PPO

## 2013-04-17 LAB — CULTURE, RESPIRATORY W GRAM STAIN

## 2013-04-17 LAB — GLUCOSE, CAPILLARY
GLUCOSE-CAPILLARY: 112 mg/dL — AB (ref 70–99)
GLUCOSE-CAPILLARY: 113 mg/dL — AB (ref 70–99)
Glucose-Capillary: 107 mg/dL — ABNORMAL HIGH (ref 70–99)

## 2013-04-17 LAB — POCT I-STAT 3, ART BLOOD GAS (G3+)
ACID-BASE EXCESS: 3 mmol/L — AB (ref 0.0–2.0)
BICARBONATE: 27 meq/L — AB (ref 20.0–24.0)
O2 Saturation: 83 %
TCO2: 28 mmol/L (ref 0–100)
pCO2 arterial: 38.7 mmHg (ref 35.0–45.0)
pH, Arterial: 7.453 — ABNORMAL HIGH (ref 7.350–7.450)
pO2, Arterial: 46 mmHg — ABNORMAL LOW (ref 80.0–100.0)

## 2013-04-17 LAB — CBC WITH DIFFERENTIAL/PLATELET
BASOS ABS: 0 10*3/uL (ref 0.0–0.1)
Basophils Relative: 0 % (ref 0–1)
EOS ABS: 0.2 10*3/uL (ref 0.0–0.7)
EOS PCT: 2 % (ref 0–5)
HCT: 35 % — ABNORMAL LOW (ref 39.0–52.0)
Hemoglobin: 11 g/dL — ABNORMAL LOW (ref 13.0–17.0)
LYMPHS ABS: 0.5 10*3/uL — AB (ref 0.7–4.0)
Lymphocytes Relative: 4 % — ABNORMAL LOW (ref 12–46)
MCH: 31.1 pg (ref 26.0–34.0)
MCHC: 31.4 g/dL (ref 30.0–36.0)
MCV: 98.9 fL (ref 78.0–100.0)
Monocytes Absolute: 0.6 10*3/uL (ref 0.1–1.0)
Monocytes Relative: 5 % (ref 3–12)
NEUTROS PCT: 89 % — AB (ref 43–77)
Neutro Abs: 11.2 10*3/uL — ABNORMAL HIGH (ref 1.7–7.7)
PLATELETS: 343 10*3/uL (ref 150–400)
RBC: 3.54 MIL/uL — AB (ref 4.22–5.81)
RDW: 15.1 % (ref 11.5–15.5)
WBC: 12.5 10*3/uL — ABNORMAL HIGH (ref 4.0–10.5)

## 2013-04-17 LAB — URINE CULTURE
Colony Count: NO GROWTH
Culture: NO GROWTH

## 2013-04-17 LAB — PROTIME-INR
INR: 2.18 — AB (ref 0.00–1.49)
PROTHROMBIN TIME: 23.6 s — AB (ref 11.6–15.2)

## 2013-04-17 LAB — BASIC METABOLIC PANEL
BUN: 28 mg/dL — ABNORMAL HIGH (ref 6–23)
CO2: 27 mEq/L (ref 19–32)
Calcium: 7.7 mg/dL — ABNORMAL LOW (ref 8.4–10.5)
Chloride: 109 mEq/L (ref 96–112)
Creatinine, Ser: 0.91 mg/dL (ref 0.50–1.35)
GFR calc Af Amer: >90 mL/min (ref >90)
GFR, EST NON AFRICAN AMERICAN: 86 mL/min — AB (ref >90)
Glucose, Bld: 133 mg/dL — ABNORMAL HIGH (ref 70–99)
POTASSIUM: 4.1 meq/L (ref 3.7–5.3)
SODIUM: 144 meq/L (ref 137–147)

## 2013-04-17 LAB — VANCOMYCIN, TROUGH: VANCOMYCIN TR: 8.7 ug/mL — AB (ref 10.0–20.0)

## 2013-04-17 LAB — PROCALCITONIN: Procalcitonin: 0.12 ng/mL

## 2013-04-17 MED ORDER — DEXTROSE 5 % IV SOLN
1.0000 g | INTRAVENOUS | Status: DC
  Administered 2013-04-17 – 2013-04-19 (×3): 1 g via INTRAVENOUS
  Filled 2013-04-17 (×4): qty 10

## 2013-04-17 MED ORDER — PROPOFOL 10 MG/ML IV EMUL
INTRAVENOUS | Status: AC
  Filled 2013-04-17: qty 100

## 2013-04-17 MED ORDER — VANCOMYCIN HCL IN DEXTROSE 1-5 GM/200ML-% IV SOLN
1000.0000 mg | Freq: Three times a day (TID) | INTRAVENOUS | Status: DC
  Administered 2013-04-18: 1000 mg via INTRAVENOUS
  Filled 2013-04-17 (×3): qty 200

## 2013-04-17 MED ORDER — LORAZEPAM 2 MG/ML IJ SOLN: 1.0000 mg | Freq: Four times a day (QID) | INTRAMUSCULAR | Status: DC | PRN

## 2013-04-17 MED ORDER — LORAZEPAM 1 MG PO TABS: 1.0000 mg | ORAL_TABLET | Freq: Four times a day (QID) | ORAL | Status: DC | PRN

## 2013-04-17 MED ORDER — MIDAZOLAM HCL 2 MG/2ML IJ SOLN
INTRAMUSCULAR | Status: AC
  Filled 2013-04-17: qty 2

## 2013-04-17 MED ORDER — METOPROLOL TARTRATE 1 MG/ML IV SOLN
5.0000 mg | INTRAVENOUS | Status: DC | PRN
  Administered 2013-04-29: 5 mg via INTRAVENOUS
  Filled 2013-04-17: qty 5

## 2013-04-17 MED ORDER — WARFARIN SODIUM 2.5 MG PO TABS
2.5000 mg | ORAL_TABLET | Freq: Once | ORAL | Status: AC
  Administered 2013-04-17: 2.5 mg via ORAL
  Filled 2013-04-17: qty 1

## 2013-04-17 MED ORDER — DIGOXIN 0.25 MG/ML IJ SOLN
0.1250 mg | Freq: Every day | INTRAMUSCULAR | Status: DC
  Filled 2013-04-17: qty 0.5

## 2013-04-17 MED ORDER — LORAZEPAM 2 MG/ML IJ SOLN: 0.5000 mg | INTRAMUSCULAR | Status: DC | PRN

## 2013-04-17 MED ORDER — NALOXONE HCL 0.4 MG/ML IJ SOLN
INTRAMUSCULAR | Status: AC
  Filled 2013-04-17: qty 1

## 2013-04-17 MED ORDER — FENTANYL CITRATE 0.05 MG/ML IJ SOLN
INTRAMUSCULAR | Status: AC
  Filled 2013-04-17: qty 2

## 2013-04-17 NOTE — Progress Notes (Signed)
ANTICOAGULATION CONSULT NOTE - Follow Up Consult  Pharmacy Consult for Coumadin Indication: atrial fibrillation  No Known Allergies  Patient Measurements: Height: 6\' 4"  (193 cm) Weight: 183 lb 3.2 oz (83.1 kg) IBW/kg (Calculated) : 86.8  Vital Signs: Temp: 99.1 F (37.3 C) (01/20 0800) Temp src: Core (Comment) (01/20 0600) BP: 120/50 mmHg (01/20 0800) Pulse Rate: 65 (01/20 0800)  Labs:  Recent Labs  04/14/13 1110  04/15/13 0400 04/15/13 1650 04/16/13 0400 04/17/13 0500  HGB  --   < > 12.1*  --  11.2* 11.0*  HCT  --   --  36.2*  --  35.9* 35.0*  PLT  --   --  379  --  308 343  LABPROT  --   --  20.3*  --  20.9* 23.6*  INR  --   --  1.79*  --  1.86* 2.18*  CREATININE  --   < > 1.39* 1.40* 1.35 0.91  TROPONINI <0.30  --   --   --   --   --   < > = values in this interval not displayed.  Estimated Creatinine Clearance: 92.6 ml/min (by C-G formula based on Cr of 0.91).   Assessment: 68 yo male with a history of atrial fibrillation and on chronic Coumadin therapy prior to admission. Home dose of Coumadin is 2.5 mg daily alternating with 5 mg every other day. After two 5 mg doses of Coumadin, INR is 2.18 with an increase in 24 hours from 1.86. CBC continues to be low but stable with no bleeding noted.       Goal of Therapy:  INR 2-3 Monitor platelets by anticoagulation protocol: Yes   Plan:  1. Change Coumadin to 2.5 mg po X 1 tonight  2. Discontinue subcutaneous Heparin now that INR > 2 3. Monitor daily PT/INR adjusting Coumadin dose accordingly  4. Monitor CBC and signs of bleed   Baker,Katherine PharmD Candidate  04/17/2013,8:34 AM  I agree with above.  Sherlon Handing, PharmD, BCPS Clinical pharmacist, pager [phone] 04/17/2013 11:12 AM

## 2013-04-17 NOTE — Progress Notes (Signed)
04/17/13 0535  Pt experienced SOB and desaturation to low 80s sustained. Pt arousable and oriented. eLink MD made aware. RT and RN at bedside. Stat ABG drawn. CCM NP Ollis at bedside as well (see note below). Pt placed on BiPap and tolerating well at this time.   Will continue to monitor.  Wyn Quaker RN

## 2013-04-17 NOTE — Progress Notes (Signed)
04/16/13 2345   Pt became agitated with a CIWA of 13. Previous CIWA of 4. Pt prn ativan. Pt also has oxygen saturation of high 80s, currently on Venturi Mask at 50%. Pt switched to NRB Mask. Order for ABG received by CCM Resident Owens Shark.   Will continue to monitor  Wyn Quaker RN

## 2013-04-17 NOTE — Progress Notes (Signed)
Called to bedside by E-Link MD to assess respiratory status.  Patient with 100% NRB in place, sats in high 80's/low 90's.  Pt asked to take a deep breath and cough.  He demonstrated and strong cough with improvement in saturations post cough.  Chart reviewed, patient has been on CIWA protocol receiving Ativan for ETOH withdrawal.  He easily awakens with clear speech and is appropriate.  ABG reviewed at bedside, hypoxemia noted (7.45 / 38 / 46/ 27). Patient placed on bipap with immediate improvement in saturations.  Hx of OSA, admitted for PNA.  PAH noted on ECHO.    Plan: -bipap support now -romazicon if needed for increased sedation -if further agitation, consider transition to precedex -rec nocturnal CPAP/Bipap for OSA -f/u cxr in am   Noe Gens, NP-C [facility] Pgr: [phone] or [phone]

## 2013-04-17 NOTE — Progress Notes (Signed)
Antibiotic Consult by Pharmacy: Vancomycin Indication: Pneumonia  Vancomycin trough = 8.7 on 1 Gm q 12 hrs Desired vancomycin trough = 15-20  The vancomycin trough is subtherapeutic. The dose will be changed to vancomycin 1Gm every 8 hrs. Will check a trough again at steady state if the pt remains on vancomycin.  Arrie Senate, Pharmd

## 2013-04-17 NOTE — Progress Notes (Signed)
RT called to bedside for pt in distress. Pt found responsive on NRB. Sats 85%, increased WOB. Per Margaree Mackintosh MD pt placed on BiPap. Immediate improvement once Pt placed on BiPap 16/6, RR 15, FiO2 80%. Sats 96%, RR 21. RT will continue to monitor.

## 2013-04-17 NOTE — Progress Notes (Signed)
04/17/13 0140  Pt became agitated took off NRB mask while suctioning himself. Pt desatted down to mid 92s quite quickly. RN, RT, and Agricultural consultant at bedside. Pt reminded to take deep breaths. Pt became agitated, and was given prn ativan. Pt then maintain sats in high to low 90s. CCM Resident made aware.   Will continue to monitor.   Wyn Quaker

## 2013-04-17 NOTE — Progress Notes (Signed)
PULMONARY / CRITICAL CARE MEDICINE  Name: Connor Ford MRN: [id number] DOB: Aug 11, 1945    ADMISSION DATE:  05/04/2013 CONSULTATION DATE:  04/13/2013  REFERRING MD :  [facility] PRIMARY SERVICE:  PCCM  CHIEF COMPLAINT:  Acute respiratory failure  BRIEF PATIENT DESCRIPTION: 68 y/o w/ PMHx of systolic CHF and sleep apnea admitted on 1/15 with dyspnea, fever, and pulmonary infiltrates. On 1/16, patient developed acute respiratory distress and CXR demonstrated worsening bilateral airspace disease.  PCCM was consulted.  SIGNIFICANT EVENTS / STUDIES:  1/15  Admitted for pneumonia 1/16  Transferred to ICU 1/16   Intubated 1/19  Extubated  LINES / TUBES: ETT 1/16 >>> 1/19 CVL 1/16 >>> R. Radial A-line 1/16 >>> 1/20  CULTURES: 1/15 Blood >>> 1/15 Urine >>> 1/15 Resp virus panel nasal swab >>> neg 1/17 Resp >>> 1/18 Resp virus panel trach aspir >>> Flu A positive 1/19 Blood >>>  ANTIBIOTICS: Ceftriaxone 1/15 >>> 1/16 Azithromycin 1/15 >>> 1/16  Levaquin 1/16 >>> 1/16 Tamiflu 1/16 >>> 1/19 Vancomycin 1/16 >>>  Zosyn 1/16 >>> 1/20 Rocephin 1/20 >>>  INTERVAL HISTORY: Desatted overnight, started on BiPAP. Tolerating well. May need re-intubation today. Also w/ agitation, started on CIWA protocol given h/o alcohol abuse. Respiratory virus panel from tracheal aspirate +ve for Flu A. Hypernatremia resolved today.   VITAL SIGNS: Temp:  [99 F (37.2 C)-102.6 F (39.2 C)] 99.6 F (37.6 C) (01/20 0600) Pulse Rate:  [51-95] 63 (01/20 0600) Resp:  [13-26] 17 (01/20 0600) BP: (106-143)/(43-88) 114/46 mmHg (01/20 0600) SpO2:  [65 %-100 %] 96 % (01/20 0600) Arterial Line BP: (111-158)/(36-89) 133/39 mmHg (01/20 0600) FiO2 (%):  [40 %-100 %] 80 % (01/20 0600)  HEMODYNAMICS:   VENTILATOR SETTINGS: Vent Mode:  [-] PRVC FiO2 (%):  [40 %-100 %] 80 % Set Rate:  [22 bmp] 22 bmp Vt Set:  [530 mL] 530 mL PEEP:  [8 cmH20] 8 cmH20 Plateau Pressure:  [16 cmH20-18 cmH20] 18 cmH20  INTAKE /  OUTPUT: Intake/Output     01/19 0701 - 01/20 0700   I.V. (mL/kg) 1454.3 (17.5)   NG/GT 80   IV Piggyback 512.5   Total Intake(mL/kg) 2046.8 (24.6)   Urine (mL/kg/hr) 1970 (1)   Total Output 1970   Net +76.8         PHYSICAL EXAMINATION: General:  No acute distress. On BiPAP Neuro:  Awake, alert. Moves all limbs spontaneously. Follows commands. HEENT: PERRL. Cardiovascular: Rate controlled, rhythm irregularly irregular. 2/6 systolic murmur.  Lungs: Air entry equal bilaterally. Coarse breath sounds diffusely.  Abdomen:  Soft, nontender, bowel sounds present.  Musculoskeletal: No edema. Chronic venous stasis changes in LE's. Skin: Intact.   LABS:  CBC  Recent Labs Lab 04/15/13 0400 04/16/13 0400 04/17/13 0500  WBC 9.6 8.4 12.5*  HGB 12.1* 11.2* 11.0*  HCT 36.2* 35.9* 35.0*  PLT 379 308 343   Coag's  Recent Labs Lab 04/15/13 0400 04/16/13 0400 04/17/13 0500  INR 1.79* 1.86* 2.18*   BMET  Recent Labs Lab 04/15/13 1650 04/16/13 0400 04/17/13 0500  NA 149* 151* 144  K 4.6 4.7 4.1  CL 110 114* 109  CO2 27 28 27   BUN 39* 39* 28*  CREATININE 1.40* 1.35 0.91  GLUCOSE 118* 129* 133*   Electrolytes  Recent Labs Lab May 02, 2013 1724 May 02, 2013 2245  04/15/13 1650 04/16/13 0400 04/17/13 0500  CALCIUM 8.5  --   < > 8.2* 8.3* 7.7*  MG  --  2.0  --   --  2.9*  --  PHOS  --  4.0  --   --   --   --   < > = values in this interval not displayed.  Sepsis Markers  Recent Labs Lab 04/23/2013 1746 04/13/13 1810 04/14/13 0421 04/17/13 0500  LATICACIDVEN 2.22* 1.5 1.4  --   PROCALCITON  --   --   --  0.12   ABG  Recent Labs Lab 04/15/13 1304 04/16/13 2344 04/17/13 0508  PHART 7.328* 7.425 7.453*  PCO2ART 52.2* 43.6 38.7  PO2ART 96.0 56.0* 46.0*   Liver Enzymes  Recent Labs Lab 04/13/13 0501 04/14/13 0415 04/16/13 0400  AST 110* 151* 69*  ALT 71* 104* 59*  ALKPHOS 100 110 85  BILITOT 0.8 0.9 0.5  ALBUMIN 2.2* 2.2* 1.9*   Cardiac  Enzymes  Recent Labs Lab 04/21/2013 1724 04/14/2013 1840 03/30/2013 2245 04/13/13 1810 04/14/13 1110  TROPONINI  --  <0.30  --  <0.30 <0.30  PROBNP 4075.0*  --  2568.0*  --   --    Glucose  Recent Labs Lab 04/16/13 0352 04/16/13 0804 04/16/13 1136 04/16/13 1534 04/16/13 1911 04/17/13 0340  GLUCAP 124* 108* 102* 105* 144* 112*   CXR:  None today  ASSESSMENT / PLAN:  PULMONARY A:   Acute hypoxemic respiratory failure. Likely CAP 2/2 Flu A infection.  Extubated yesterday. Desat overnight, started on BiPAP P:   Continue BiPAP for now Continue to monitor. May need re-intubation later today Xopenex prn Albuterol neb q6h Abx as below  CARDIOVASCULAR A:  H/o CHF. ECHO shows normal EF.  Mod mitral stenosis, pulm HTN 23mmHg PAP AF. Rate controlled. On Coumadin at home. P:  Goal MAP > 65 Hold Cardizem given normal HR. Continue Digoxin IV Give Metoprolol prn for tachycardia ASA Hold Lipitor as elevated transaminases Hold ACEI in setting of AKI Avoid Amiodarone for now in setting of ARDS. Continue Coumadin  RENAL A:   AKI. Resolving Hypernatremia. Resolved.  P:   D5W @ 50 ml/hr Hold ACEI Trend BMP   GASTROINTESTINAL A:   Elevated transaminases, likely 2/2 shock liver. Resolving. Hepatitis panel -ve. P:   Hold diet while on BiPAP Trend CMP  HEMATOLOGIC A:   Mild anemia. AF, on Coumadin at home. No leukocytosis. VTE Px. P:  Trend CBC Continue Coumadin Heparin [place]  INFECTIOUS A:   Suspected CAP 2/2 Flu A infection.  Continued fever. Procalcitonin 0.12. P:   Abx; Vancomycin + Rocephin Bacterial cultures negative to date  ENDOCRINE  A:   Normoglycemia.   P:   Monitor glucose on BMP  NEUROLOGIC A:   Agitation overnight P:   Ativan prn    Signed: Luanne Bras, MD  04/16/2013  1:13 PM   PCCM ATTENDING: I have interviewed and examined the patient and reviewed the database. I have formulated the assessment and plan as reflected in the  note above with amendments made by me. 30 mins of direct critical care time provided. Need to watch closely in ICU as at high risk for re-intubation  Merton Border, MD;  PCCM service; [place] [phone]

## 2013-04-17 NOTE — Progress Notes (Signed)
RT note- Patient remains on NRB+6l/min [place], Sp02 -90-93%

## 2013-04-18 ENCOUNTER — Inpatient Hospital Stay (HOSPITAL_COMMUNITY): Payer: Medicare PPO

## 2013-04-18 LAB — COMPREHENSIVE METABOLIC PANEL
ALT: 37 U/L (ref 0–53)
AST: 64 U/L — AB (ref 0–37)
Albumin: 1.8 g/dL — ABNORMAL LOW (ref 3.5–5.2)
Alkaline Phosphatase: 66 U/L (ref 39–117)
BUN: 22 mg/dL (ref 6–23)
CO2: 27 mEq/L (ref 19–32)
CREATININE: 0.82 mg/dL (ref 0.50–1.35)
Calcium: 7.7 mg/dL — ABNORMAL LOW (ref 8.4–10.5)
Chloride: 107 mEq/L (ref 96–112)
GFR calc Af Amer: >90 mL/min (ref >90)
GFR calc non Af Amer: 89 mL/min — ABNORMAL LOW (ref >90)
Glucose, Bld: 116 mg/dL — ABNORMAL HIGH (ref 70–99)
Potassium: 4.1 mEq/L (ref 3.7–5.3)
Sodium: 144 mEq/L (ref 137–147)
TOTAL PROTEIN: 5.9 g/dL — AB (ref 6.0–8.3)
Total Bilirubin: 0.9 mg/dL (ref 0.3–1.2)

## 2013-04-18 LAB — CBC
HCT: 34.3 % — ABNORMAL LOW (ref 39.0–52.0)
Hemoglobin: 11.2 g/dL — ABNORMAL LOW (ref 13.0–17.0)
MCH: 31.6 pg (ref 26.0–34.0)
MCHC: 32.7 g/dL (ref 30.0–36.0)
MCV: 96.9 fL (ref 78.0–100.0)
PLATELETS: 357 10*3/uL (ref 150–400)
RBC: 3.54 MIL/uL — ABNORMAL LOW (ref 4.22–5.81)
RDW: 14.6 % (ref 11.5–15.5)
WBC: 11 10*3/uL — ABNORMAL HIGH (ref 4.0–10.5)

## 2013-04-18 LAB — PROTIME-INR
INR: 2.71 — ABNORMAL HIGH (ref 0.00–1.49)
Prothrombin Time: 27.8 seconds — ABNORMAL HIGH (ref 11.6–15.2)

## 2013-04-18 MED ORDER — FUROSEMIDE 10 MG/ML IJ SOLN
40.0000 mg | Freq: Once | INTRAMUSCULAR | Status: AC
  Administered 2013-04-18: 40 mg via INTRAVENOUS
  Filled 2013-04-18: qty 4

## 2013-04-18 MED ORDER — WARFARIN SODIUM 1 MG PO TABS
1.0000 mg | ORAL_TABLET | Freq: Once | ORAL | Status: AC
  Administered 2013-04-18: 1 mg via ORAL
  Filled 2013-04-18: qty 1

## 2013-04-18 NOTE — Progress Notes (Signed)
PULMONARY / CRITICAL CARE MEDICINE  Name: Connor Ford MRN: [id number] DOB: 04/13/1945    ADMISSION DATE:  04/17/2013 CONSULTATION DATE:  04/13/2013  REFERRING MD :  [facility] PRIMARY SERVICE:  PCCM  CHIEF COMPLAINT:  Acute respiratory failure  BRIEF PATIENT DESCRIPTION: 68 y/o w/ PMHx of systolic CHF and sleep apnea admitted on 1/15 with dyspnea, fever, and pulmonary infiltrates. On 1/16, patient developed acute respiratory distress and CXR demonstrated worsening bilateral airspace disease.  PCCM was consulted.  SIGNIFICANT EVENTS / STUDIES:  1/15  Admitted for pneumonia 1/16  Transferred to ICU/Intubated 1/19  Extubated  LINES / TUBES: ETT 1/16 >>> 1/19 CVL 1/16 >>> R. Radial A-line 1/16 >>> 1/20  CULTURES: 1/15 Blood >>> 1/15 Urine >>> 1/15 Resp virus panel nasal swab >>> neg 1/17 Resp >>> 1/18 Resp virus panel trach aspir >>> Flu A positive 1/19 Blood >>>  ANTIBIOTICS: Ceftriaxone 1/15 >>> 1/16 Azithromycin 1/15 >>> 1/16  Levaquin 1/16 >>> 1/16 Tamiflu 1/16 >>> 1/19 Vancomycin 1/16 >>>  Zosyn 1/16 >>> 1/20 Rocephin 1/20 >>>  INTERVAL HISTORY: Tolerated BiPAP well overnight for several hours but did well all day on mask. No SOB. No chest pain, abdominal pain. No nausea, vomiting, diarrhea.   VITAL SIGNS: Temp:  [98 F (36.7 C)-99.3 F (37.4 C)] 99 F (37.2 C) (01/21 0737) Pulse Rate:  [45-86] 82 (01/21 0700) Resp:  [17-31] 20 (01/21 0700) BP: (106-155)/(40-66) 155/50 mmHg (01/21 0700) SpO2:  [87 %-99 %] 96 % (01/21 0700) Arterial Line BP: (149-172)/(42-49) 172/49 mmHg (01/20 1000) FiO2 (%):  [60 %-100 %] 100 % (01/21 0548)  HEMODYNAMICS:   VENTILATOR SETTINGS: Vent Mode:  [-] BIPAP FiO2 (%):  [60 %-100 %] 100 % Set Rate:  [15 bmp] 15 bmp PEEP:  [6 cmH20] 6 cmH20 Pressure Support:  [10 cmH20] 10 cmH20  INTAKE / OUTPUT: Intake/Output     01/20 0701 - 01/21 0700 01/21 0701 - 01/22 0700   I.V. (mL/kg) 1100 (13.2)    NG/GT     IV Piggyback 462.5    Total Intake(mL/kg) 1562.5 (18.8)    Urine (mL/kg/hr) 1575 (0.8)    Total Output 1575     Net -12.5          Urine Occurrence 1 x      PHYSICAL EXAMINATION: General:  No acute distress. On mask Neuro:  Awake, alert. Moves all limbs spontaneously. Follows commands. HEENT: PERRL. Cardiovascular: Rate controlled, rhythm irregularly irregular. 2/6 systolic murmur.  Lungs: Air entry equal bilaterally. Coarse breath sounds diffusely.  Abdomen:  Soft, nontender, bowel sounds present.  Musculoskeletal: No edema. Chronic venous stasis changes in LE's. Skin: Intact.   LABS:  CBC  Recent Labs Lab 04/16/13 0400 04/17/13 0500 04/18/13 0500  WBC 8.4 12.5* 11.0*  HGB 11.2* 11.0* 11.2*  HCT 35.9* 35.0* 34.3*  PLT 308 343 357   Coag's  Recent Labs Lab 04/16/13 0400 04/17/13 0500 04/18/13 0500  INR 1.86* 2.18* 2.71*   BMET  Recent Labs Lab 04/16/13 0400 04/17/13 0500 04/18/13 0500  NA 151* 144 144  K 4.7 4.1 4.1  CL 114* 109 107  CO2 28 27 27   BUN 39* 28* 22  CREATININE 1.35 0.91 0.82  GLUCOSE 129* 133* 116*   Electrolytes  Recent Labs Lab 04/01/2013 1724 04/19/2013 2245  04/16/13 0400 04/17/13 0500 04/18/13 0500  CALCIUM 8.5  --   < > 8.3* 7.7* 7.7*  MG  --  2.0  --  2.9*  --   --  PHOS  --  4.0  --   --   --   --   < > = values in this interval not displayed.  Sepsis Markers  Recent Labs Lab 27-Apr-2013 1746 04/13/13 1810 04/14/13 0421 04/17/13 0500  LATICACIDVEN 2.22* 1.5 1.4  --   PROCALCITON  --   --   --  0.12   ABG  Recent Labs Lab 04/15/13 1304 04/16/13 2344 04/17/13 0508  PHART 7.328* 7.425 7.453*  PCO2ART 52.2* 43.6 38.7  PO2ART 96.0 56.0* 46.0*   Liver Enzymes  Recent Labs Lab 04/14/13 0415 04/16/13 0400 04/18/13 0500  AST 151* 69* 64*  ALT 104* 59* 37  ALKPHOS 110 85 66  BILITOT 0.9 0.5 0.9  ALBUMIN 2.2* 1.9* 1.8*   Cardiac Enzymes  Recent Labs Lab April 27, 2013 1724 April 27, 2013 1840 04/27/2013 2245 04/13/13 1810 04/14/13 1110   TROPONINI  --  <0.30  --  <0.30 <0.30  PROBNP 4075.0*  --  2568.0*  --   --    Glucose  Recent Labs Lab 04/16/13 1136 04/16/13 1534 04/16/13 1911 04/17/13 0340 04/17/13 0758 04/17/13 1130  GLUCAP 102* 105* 144* 112* 107* 113*   CXR:  Some pulmonary edema  ASSESSMENT / PLAN:  PULMONARY A:   Acute hypoxemic respiratory failure. Likely CAP 2/2 Flu A infection.  Extubated yesterday. Desat overnight, started on BiPAP P:   BiPAP qhs prn Monitor in ICU one more night Xopenex prn Albuterol neb q6h  CARDIOVASCULAR A:  H/o CHF. ECHO shows normal EF.  Mod mitral stenosis, pulm HTN 69mmHg PAP AF. Rate controlled. On Coumadin at home. P:  Goal MAP > 65 Hold Cardizem given normal HR. Metoprolol prn to maintain HR <115 ASA Hold Lipitor as elevated transaminases Hold ACEI in setting of AKI Avoid Amiodarone for now in setting of ARDS. Continue Coumadin  RENAL A:   AKI. Resolving Hypernatremia. Resolved.  P:   D5W @ 50 ml/hr Hold ACEI Trend BMP   GASTROINTESTINAL A:   Elevated transaminases, likely 2/2 shock liver. Resolving. Hepatitis panel -ve. P:   Restart diet full liquid Trend BMP  HEMATOLOGIC A:   Mild anemia. AF, on Coumadin at home. No leukocytosis. VTE Px. P:  Trend CBC Continue Coumadin Heparin [place]  INFECTIOUS A:   CAP 2/2 Flu A infection.  P:   D/C Vancomycin  Rocephin, completed tamiflu  ENDOCRINE  A:   Normoglycemia.   P:   Monitor glucose on BMP  NEUROLOGIC A:   Agitation overnight P:   Ativan prn  Vertell Novak, PGY-3 7:59 AM, 04/18/2013  PCCM ATTENDING: I have interviewed and examined the patient and reviewed the database. I have formulated the assessment and plan as reflected in the note above with amendments made by me.   Remains tenuous. Cont to monitor in ICU today  Merton Border, MD;  PCCM service; Mobile [phone]

## 2013-04-18 NOTE — Progress Notes (Signed)
ANTICOAGULATION CONSULT NOTE - Follow Up Consult  Pharmacy Consult for Coumadin Indication: atrial fibrillation  No Known Allergies  Patient Measurements: Height: 6\' 4"  (193 cm) Weight: 183 lb 3.2 oz (83.1 kg) IBW/kg (Calculated) : 86.8  Vital Signs: Temp: 99 F (37.2 C) (01/21 0737) Temp src: Oral (01/21 0737) BP: 155/50 mmHg (01/21 0700) Pulse Rate: 82 (01/21 0700)  Labs:  Recent Labs  04/16/13 0400 04/17/13 0500 04/18/13 0500  HGB 11.2* 11.0* 11.2*  HCT 35.9* 35.0* 34.3*  PLT 308 343 357  LABPROT 20.9* 23.6* 27.8*  INR 1.86* 2.18* 2.71*  CREATININE 1.35 0.91 0.82    Estimated Creatinine Clearance: 102.7 ml/min (by C-G formula based on Cr of 0.82).   Assessment: 68 yo male with a history of atrial fibrillation and on chronic Coumadin therapy prior to admission. Home dose of Coumadin is 2.5 mg daily alternating with 5 mg every other day. INR is at goal at 2.71 with an increase in 24 hours from 2.18. CBC continues to be low but stable with no bleeding noted.        Goal of Therapy:  INR 2-3 Monitor platelets by anticoagulation protocol: Yes   Plan:  1. Coumadin 1 mg po X 1 tonight  2. Monitor daily PT/INR adjusting Coumadin dose accordingly  3. Monitor CBC and signs of bleed   Baker,Katherine PharmD Candidate  04/18/2013,8:50 AM  I agree with above.   Sherlon Handing, PharmD, BCPS Clinical pharmacist, pager [phone] 04/18/2013 12:09 PM

## 2013-04-19 ENCOUNTER — Inpatient Hospital Stay (HOSPITAL_COMMUNITY): Payer: Medicare PPO

## 2013-04-19 DIAGNOSIS — J101 Influenza due to other identified influenza virus with other respiratory manifestations: Secondary | ICD-10-CM | POA: Diagnosis present

## 2013-04-19 DIAGNOSIS — J111 Influenza due to unidentified influenza virus with other respiratory manifestations: Secondary | ICD-10-CM

## 2013-04-19 LAB — CBC
HCT: 35 % — ABNORMAL LOW (ref 39.0–52.0)
HEMOGLOBIN: 11.5 g/dL — AB (ref 13.0–17.0)
MCH: 31.8 pg (ref 26.0–34.0)
MCHC: 32.9 g/dL (ref 30.0–36.0)
MCV: 96.7 fL (ref 78.0–100.0)
Platelets: 437 10*3/uL — ABNORMAL HIGH (ref 150–400)
RBC: 3.62 MIL/uL — ABNORMAL LOW (ref 4.22–5.81)
RDW: 14.7 % (ref 11.5–15.5)
WBC: 12 10*3/uL — AB (ref 4.0–10.5)

## 2013-04-19 LAB — BASIC METABOLIC PANEL
BUN: 21 mg/dL (ref 6–23)
CHLORIDE: 106 meq/L (ref 96–112)
CO2: 28 mEq/L (ref 19–32)
Calcium: 8 mg/dL — ABNORMAL LOW (ref 8.4–10.5)
Creatinine, Ser: 0.77 mg/dL (ref 0.50–1.35)
GFR calc non Af Amer: >90 mL/min (ref >90)
Glucose, Bld: 120 mg/dL — ABNORMAL HIGH (ref 70–99)
POTASSIUM: 3.8 meq/L (ref 3.7–5.3)
Sodium: 145 mEq/L (ref 137–147)

## 2013-04-19 LAB — CULTURE, BLOOD (ROUTINE X 2)
CULTURE: NO GROWTH
Culture: NO GROWTH

## 2013-04-19 LAB — GLUCOSE, CAPILLARY
GLUCOSE-CAPILLARY: 101 mg/dL — AB (ref 70–99)
GLUCOSE-CAPILLARY: 134 mg/dL — AB (ref 70–99)
Glucose-Capillary: 110 mg/dL — ABNORMAL HIGH (ref 70–99)
Glucose-Capillary: 130 mg/dL — ABNORMAL HIGH (ref 70–99)
Glucose-Capillary: 97 mg/dL (ref 70–99)

## 2013-04-19 LAB — PRO B NATRIURETIC PEPTIDE: Pro B Natriuretic peptide (BNP): 3144 pg/mL — ABNORMAL HIGH (ref 0–125)

## 2013-04-19 LAB — PROTIME-INR
INR: 2.72 — AB (ref 0.00–1.49)
Prothrombin Time: 27.9 seconds — ABNORMAL HIGH (ref 11.6–15.2)

## 2013-04-19 MED ORDER — LORAZEPAM 0.5 MG PO TABS
0.5000 mg | ORAL_TABLET | Freq: Four times a day (QID) | ORAL | Status: DC | PRN
  Administered 2013-04-22 – 2013-04-24 (×3): 0.5 mg via ORAL
  Filled 2013-04-19 (×3): qty 1

## 2013-04-19 MED ORDER — FUROSEMIDE 10 MG/ML IJ SOLN
40.0000 mg | Freq: Two times a day (BID) | INTRAMUSCULAR | Status: DC
  Administered 2013-04-19 – 2013-04-20 (×2): 40 mg via INTRAVENOUS
  Filled 2013-04-19 (×4): qty 4

## 2013-04-19 MED ORDER — WARFARIN SODIUM 2.5 MG PO TABS
2.5000 mg | ORAL_TABLET | Freq: Once | ORAL | Status: AC
  Administered 2013-04-19: 2.5 mg via ORAL
  Filled 2013-04-19: qty 1

## 2013-04-19 MED ORDER — ENSURE COMPLETE PO LIQD
237.0000 mL | Freq: Three times a day (TID) | ORAL | Status: DC
  Administered 2013-04-19 – 2013-04-24 (×11): 237 mL via ORAL

## 2013-04-19 MED ORDER — ATORVASTATIN CALCIUM 20 MG PO TABS
20.0000 mg | ORAL_TABLET | Freq: Every day | ORAL | Status: DC
  Administered 2013-04-19 – 2013-04-28 (×10): 20 mg via ORAL
  Filled 2013-04-19 (×12): qty 1

## 2013-04-19 NOTE — Progress Notes (Signed)
ANTICOAGULATION CONSULT NOTE - Follow Up Consult  Pharmacy Consult for Coumadin Indication: atrial fibrillation  No Known Allergies  Patient Measurements: Height: 6\' 4"  (193 cm) Weight: 183 lb 3.2 oz (83.1 kg) IBW/kg (Calculated) : 86.8  Vital Signs: Temp: 97.9 F (36.6 C) (01/22 0802) Temp src: Oral (01/22 0802) BP: 134/45 mmHg (01/22 0700) Pulse Rate: 63 (01/22 0700)  Labs:  Recent Labs  04/17/13 0500 04/18/13 0500 04/19/13 0438  HGB 11.0* 11.2* 11.5*  HCT 35.0* 34.3* 35.0*  PLT 343 357 437*  LABPROT 23.6* 27.8* 27.9*  INR 2.18* 2.71* 2.72*  CREATININE 0.91 0.82 0.77    Estimated Creatinine Clearance: 105.3 ml/min (by C-G formula based on Cr of 0.77).   Assessment: 68 yo male with a history of atrial fibrillation and on chronic Coumadin therapy prior to admission. Home dose of Coumadin is 2.5 mg daily alternating with 5 mg every other day. INR continues to be therapeutic at 2.72 and was stable over the past 24 hours. CBC continue to be low but stable with no bleeding noted.        Goal of Therapy:  INR 2-3 Monitor platelets by anticoagulation protocol: Yes   Plan:  1. Coumadin 2.5 mg po X 1 tonight  2. Monitor daily PT/INR adjusting Coumadin dose accordingly  3. Monitor CBC and signs of bleed   Baker,Katherine PharmD Candidate  04/19/2013,8:42 AM  Pharmaceutical care of plan reviewed with student.  Agree with assessment and plan.  Thank you for allowing pharmacy to be a part of this patients care team.  Rowe Robert Pharm.D., BCPS Clinical Pharmacist 04/19/2013 11:41 AM Pager: [phone] Phone: [phone]

## 2013-04-19 NOTE — Progress Notes (Signed)
PULMONARY / CRITICAL CARE MEDICINE  Name: Connor Ford MRN: [id number] DOB: Apr 28, 1945    ADMISSION DATE:  03/31/2013 CONSULTATION DATE:  04/13/2013  REFERRING MD :  [facility] PRIMARY SERVICE:  PCCM  CHIEF COMPLAINT:  Acute respiratory failure  BRIEF PATIENT DESCRIPTION: 68 y/o w/ PMHx of systolic CHF and sleep apnea admitted on 1/15 with dyspnea, fever, and pulmonary infiltrates. On 1/16, patient developed acute respiratory distress and CXR demonstrated worsening bilateral airspace disease.  PCCM was consulted.  SIGNIFICANT EVENTS / STUDIES:  1/15  Admitted for pneumonia 1/16  Transferred to ICU/Intubated 1/19  Extubated  LINES / TUBES: ETT 1/16 >>> 1/19 CVL 1/16 >>> R. Radial A-line 1/16 >>> 1/20  CULTURES: 1/15 Blood >>> 1/15 Urine >>>no growth 1/15 Resp virus panel nasal swab >>> neg 1/17 Resp >>>few candida, negative 1/18 Resp virus panel trach aspir >>> Flu A positive 1/19 Blood >>>  ANTIBIOTICS: Ceftriaxone 1/15 >>> 1/16 Azithromycin 1/15 >>> 1/16  Levaquin 1/16 >>> 1/16 Tamiflu 1/16 >>> 1/19 Vancomycin 1/16 >>> 1/21 Zosyn 1/16 >>> 1/20 Rocephin 1/20 >>>  INTERVAL HISTORY: Tolerated BiPAP well overnight and mentating better this AM. No SOB. No chest pain, abdominal pain. No nausea, vomiting, diarrhea. Tolerated full liquid diet yesterday.  VITAL SIGNS: Temp:  [98 F (36.7 C)-99.5 F (37.5 C)] 98 F (36.7 C) (01/22 0356) Pulse Rate:  [63-100] 63 (01/22 0700) Resp:  [19-37] 22 (01/22 0700) BP: (121-161)/(44-97) 134/45 mmHg (01/22 0700) SpO2:  [87 %-100 %] 95 % (01/22 0700) FiO2 (%):  [80 %-100 %] 80 % (01/22 0700)  HEMODYNAMICS:   VENTILATOR SETTINGS: Vent Mode:  [-] BIPAP FiO2 (%):  [80 %-100 %] 80 % Set Rate:  [10 bmp] 10 bmp PEEP:  [6 cmH20] 6 cmH20 Pressure Support:  [10 cmH20] 10 cmH20  INTAKE / OUTPUT: Intake/Output     01/21 0701 - 01/22 0700 01/22 0701 - 01/23 0700   I.V. (mL/kg) 950 (11.4)    IV Piggyback 50    Total Intake(mL/kg) 1000  (12)    Urine (mL/kg/hr) 1620 (0.8)    Total Output 1620     Net -620          Stool Occurrence 2 x      PHYSICAL EXAMINATION: General:  No acute distress. On mask Neuro:  Awake, alert. Moves all limbs spontaneously. Follows commands. HEENT: PERRL. Cardiovascular: Rate controlled, rhythm irregularly irregular. 2/6 systolic murmur.  Lungs: Air entry equal bilaterally. Coarse breath sounds diffusely.  Abdomen:  Soft, nontender, bowel sounds present.  Musculoskeletal: No edema. Chronic venous stasis changes in LE's. Skin: Intact.   LABS:  CBC  Recent Labs Lab 04/17/13 0500 04/18/13 0500 04/19/13 0438  WBC 12.5* 11.0* 12.0*  HGB 11.0* 11.2* 11.5*  HCT 35.0* 34.3* 35.0*  PLT 343 357 437*   Coag's  Recent Labs Lab 04/17/13 0500 04/18/13 0500 04/19/13 0438  INR 2.18* 2.71* 2.72*   BMET  Recent Labs Lab 04/17/13 0500 04/18/13 0500 04/19/13 0438  NA 144 144 145  K 4.1 4.1 3.8  CL 109 107 106  CO2 27 27 28   BUN 28* 22 21  CREATININE 0.91 0.82 0.77  GLUCOSE 133* 116* 120*   Electrolytes  Recent Labs Lab 04/25/2013 1724 03/29/2013 2245  04/16/13 0400 04/17/13 0500 04/18/13 0500 04/19/13 0438  CALCIUM 8.5  --   < > 8.3* 7.7* 7.7* 8.0*  MG  --  2.0  --  2.9*  --   --   --   PHOS  --  4.0  --   --   --   --   --   < > = values in this interval not displayed.  Sepsis Markers  Recent Labs Lab 04/11/2013 1746 04/13/13 1810 04/14/13 0421 04/17/13 0500  LATICACIDVEN 2.22* 1.5 1.4  --   PROCALCITON  --   --   --  0.12   ABG  Recent Labs Lab 04/15/13 1304 04/16/13 2344 04/17/13 0508  PHART 7.328* 7.425 7.453*  PCO2ART 52.2* 43.6 38.7  PO2ART 96.0 56.0* 46.0*   Liver Enzymes  Recent Labs Lab 04/14/13 0415 04/16/13 0400 04/18/13 0500  AST 151* 69* 64*  ALT 104* 59* 37  ALKPHOS 110 85 66  BILITOT 0.9 0.5 0.9  ALBUMIN 2.2* 1.9* 1.8*   Cardiac Enzymes  Recent Labs Lab 04/02/2013 1724 04/20/2013 1840 04/23/2013 2245 04/13/13 1810 04/14/13 1110   TROPONINI  --  <0.30  --  <0.30 <0.30  PROBNP 4075.0*  --  2568.0*  --   --    Glucose  Recent Labs Lab 04/16/13 1534 04/16/13 1911 04/17/13 0340 04/17/13 0758 04/17/13 1130 04/19/13 0018  GLUCAP 105* 144* 112* 107* 113* 110*   CXR:  1/22 Some pulmonary edema fairly unchanged  ASSESSMENT / PLAN:  PULMONARY A:   Acute hypoxemic respiratory failure. Likely CAP 2/2 Flu A infection.  Extubated yesterday. Desat overnight, started on BiPAP P:   BiPAP qhs prn Xopenex prn Albuterol neb q6h  CARDIOVASCULAR A:  H/o CHF. ECHO shows normal EF.  Mod mitral stenosis, pulm HTN 51mmHg PAP AF. Rate controlled. On Coumadin at home. P:  Metoprolol prn to maintain HR <115 ASA More aggressive diuresis with lasix today Restart Lipitor Hold ACEI in setting of AKI  Continue Coumadin  RENAL A:   AKI. Resolving Hypernatremia. Resolved.  P:   Hold ACEI Trend BMP   GASTROINTESTINAL A:   Elevated transaminases, likely 2/2 shock liver. Resolving. Hepatitis panel -ve. P:   Diet regular today Trend BMP  HEMATOLOGIC A:   Mild anemia. AF, on Coumadin at home. No leukocytosis. VTE Px. P:  Trend CBC Continue Coumadin  INFECTIOUS A:   CAP 2/2 Flu A infection.  P:   Rocephin, completed tamiflu  ENDOCRINE  A:   Normoglycemia.   P:   Monitor glucose on BMP  NEUROLOGIC A:   Hx ETOH P:   Ativan prn PO as outside window for withdraw.  Vertell Novak, PGY-3 7:06 AM, 04/19/2013  PCCM ATTENDING: I have interviewed and examined the patient and reviewed the database. I have formulated the assessment and plan as reflected in the note above with amendments made by me.  Merton Border, MD;  PCCM service; Mobile [phone]

## 2013-04-19 NOTE — Progress Notes (Signed)
NUTRITION FOLLOW UP  Intervention:   Ensure Complete PO TID, each supplement provides 350 kcal and 13 grams of protein  Nutrition Dx:   Inadequate oral intake now related to difficulty breathing and poor appetite as evidenced by poor intake of meals. Ongoing.  Goal:   Intake to meet >90% of estimated nutrition needs. Unmet.  Monitor:   PO intake, labs, weight trend.  Assessment:   Pt with complex medical history including atrial fibrillation on Coumadin and amiodarone, digoxin and Diltiazem, HTN, HLD, systolic CHF, presented with main concern of several days duration of progressively worsening shortness of breath that initially started with exertion and now present at rest. Pt explains he has seen his PCP and was diagnosed with PNA, and was started on Levaquin but his symptoms did not improve.   Patient was intubated on 1/16, extubated 1/19. Was on Oxepa TF while intubated. Since extubation, TF is off and diet has been advanced to regular. PO intake is poor due to a poor appetite. Patient agreed to vanilla Ensure TID between meals while he is not eating very well. Suspect difficulty breathing and need for BiPAP on and off is hindering patient's ability to consume adequate nutrition.  Height: Ht Readings from Last 1 Encounters:  04/13/13 6\' 4"  (1.93 m)    Weight Status:   Wt Readings from Last 1 Encounters:  04/16/13 183 lb 3.2 oz (83.1 kg)  04/14/13  200 lb 2.8 oz (90.8 kg)  Re-estimated needs:  Kcal: 2400 Protein: 115-135 gm Fluid: 2.4 L  Skin: no wounds  Diet Order: General   Intake/Output Summary (Last 24 hours) at 04/19/13 1154 Last data filed at 04/19/13 1000  Gross per 24 hour  Intake    850 ml  Output   1545 ml  Net   -695 ml    Last BM: 1/15   Labs:   Recent Labs Lab 04/04/2013 1724 04/18/2013 2245  04/16/13 0400 04/17/13 0500 04/18/13 0500 04/19/13 0438  NA 140  --   < > 151* 144 144 145  K 4.9  --   < > 4.7 4.1 4.1 3.8  CL 102  --   < > 114* 109  107 106  CO2 21  --   < > 28 27 27 28   BUN 40*  --   < > 39* 28* 22 21  CREATININE 1.96*  --   < > 1.35 0.91 0.82 0.77  CALCIUM 8.5  --   < > 8.3* 7.7* 7.7* 8.0*  MG  --  2.0  --  2.9*  --   --   --   PHOS  --  4.0  --   --   --   --   --   GLUCOSE 132*  --   < > 129* 133* 116* 120*  < > = values in this interval not displayed.  CBG (last 3)   Recent Labs  04/17/13 1130 04/19/13 0018 04/19/13 0801  GLUCAP 113* 110* 97    Scheduled Meds: . antiseptic oral rinse  15 mL Mouth Rinse QID  . aspirin  81 mg Oral QHS  . atorvastatin  20 mg Oral q1800  . cefTRIAXone (ROCEPHIN)  IV  1 g Intravenous Q24H  . chlorhexidine  15 mL Mouth Rinse BID  . furosemide  40 mg Intravenous BID  . sodium chloride  3 mL Intravenous Q12H  . warfarin  2.5 mg Oral ONCE-1800  . Warfarin - Pharmacist Dosing Inpatient   Does not apply  q1800    Continuous Infusions: None    Molli Barrows, RD, LDN, [place] Pager [phone] After Hours Pager [phone]

## 2013-04-20 ENCOUNTER — Inpatient Hospital Stay (HOSPITAL_COMMUNITY): Payer: Medicare PPO

## 2013-04-20 LAB — GLUCOSE, CAPILLARY
GLUCOSE-CAPILLARY: 102 mg/dL — AB (ref 70–99)
GLUCOSE-CAPILLARY: 105 mg/dL — AB (ref 70–99)
Glucose-Capillary: 106 mg/dL — ABNORMAL HIGH (ref 70–99)
Glucose-Capillary: 124 mg/dL — ABNORMAL HIGH (ref 70–99)
Glucose-Capillary: 152 mg/dL — ABNORMAL HIGH (ref 70–99)

## 2013-04-20 LAB — PROTIME-INR
INR: 2.42 — ABNORMAL HIGH (ref 0.00–1.49)
Prothrombin Time: 25.5 seconds — ABNORMAL HIGH (ref 11.6–15.2)

## 2013-04-20 MED ORDER — FUROSEMIDE 10 MG/ML IJ SOLN
40.0000 mg | Freq: Two times a day (BID) | INTRAMUSCULAR | Status: AC
  Administered 2013-04-20: 40 mg via INTRAVENOUS

## 2013-04-20 MED ORDER — WARFARIN SODIUM 2.5 MG PO TABS
2.5000 mg | ORAL_TABLET | Freq: Once | ORAL | Status: AC
  Administered 2013-04-20: 2.5 mg via ORAL
  Filled 2013-04-20: qty 1

## 2013-04-20 MED ORDER — LEVOFLOXACIN 750 MG PO TABS
750.0000 mg | ORAL_TABLET | Freq: Every day | ORAL | Status: AC
  Administered 2013-04-20 – 2013-04-23 (×4): 750 mg via ORAL
  Filled 2013-04-20 (×6): qty 1

## 2013-04-20 NOTE — Progress Notes (Signed)
ANTICOAGULATION CONSULT NOTE - Follow Up Consult  Pharmacy Consult for Coumadin Indication: atrial fibrillation  No Known Allergies  Patient Measurements: Height: 6\' 4"  (193 cm) Weight: 183 lb 3.2 oz (83.1 kg) IBW/kg (Calculated) : 86.8  Vital Signs: Temp: 99.5 F (37.5 C) (01/23 0745) Temp src: Axillary (01/23 0745) BP: 132/58 mmHg (01/23 0700) Pulse Rate: 88 (01/23 0700)  Labs:  Recent Labs  04/18/13 0500 04/19/13 0438 04/20/13 0223  HGB 11.2* 11.5*  --   HCT 34.3* 35.0*  --   PLT 357 437*  --   LABPROT 27.8* 27.9* 25.5*  INR 2.71* 2.72* 2.42*  CREATININE 0.82 0.77  --     Estimated Creatinine Clearance: 105.3 ml/min (by C-G formula based on Cr of 0.77).   Assessment: 68 yo male with a history of atrial fibrillation and on chronic Coumadin therapy prior to admission. Home dose of Coumadin is 2.5 mg daily alternating with 5 mg every other day. INR continues to be therapeutic at 2.42 with a small decrease over the past 24 hours. Last CBC was low but stable, and no bleeding has been noted.        Goal of Therapy:  INR 2-3 Monitor platelets by anticoagulation protocol: Yes   Plan:  1. Continue Coumadin 2.5 mg po X 1 tonight  2. Monitor daily PT/INR adjusting Coumadin dose accordingly  3. Monitor CBC and signs of bleed   Baker,Katherine PharmD Candidate  04/20/2013,8:24 AM  I agree with above assessment and plan.  Sherlon Handing, PharmD, BCPS Clinical pharmacist, pager [phone] 04/20/2013  8:59 AM

## 2013-04-20 NOTE — Evaluation (Signed)
Physical Therapy Evaluation Patient Details Name: BENJI POYNTER MRN: [id number] DOB: [birth year] Today's Date: 04/20/2013 Time: 3295-1884 PT Time Calculation (min): 26 min  PT Assessment / Plan / Recommendation History of Present Illness  Pt is 68 yo male presenting [facility] ED with main concern of several days duration of progressively worsening shortness of breath. +flu with acute respiratory failure. PMHx includes atrial fibrillation on Coumadin and amiodarone,  HTN, HLD, systolic CHF with EF 40 %  Clinical Impression  Pt admitted with respiratory failure due to flu. Pt currently with functional limitations due to the deficits listed below (see PT Problem List).  Pt will benefit from skilled PT to increase their independence and safety with mobility to allow discharge to the venue listed below.       PT Assessment  Patient needs continued PT services    Follow Up Recommendations  Home health PT;Supervision for mobility/OOB    Does the patient have the potential to tolerate intense rehabilitation      Barriers to Discharge        Equipment Recommendations  Rolling walker with 5" wheels;Other (comment) (home O2)    Recommendations for Other Services OT consult   Frequency Min 3X/week    Precautions / Restrictions Precautions Precautions: Fall;Other (comment) Precaution Comments: droplet   Pertinent Vitals/Pain SaO2 on 100% NRB mask 100% at rest; decr to 79% with standing transfer with slow recovery to 88%       Mobility  Bed Mobility Overal bed mobility: Needs Assistance Bed Mobility: Rolling;Sidelying to Sit Rolling: Supervision Sidelying to sit: Supervision;HOB elevated General bed mobility comments: supervision for safety due to lightheadedness Transfers Overall transfer level: Needs assistance Equipment used: 1 person hand held assist Transfers: Sit to/from Stand;Stand Pivot Transfers Sit to Stand: Min assist Stand pivot transfers: Min assist;+2  safety/equipment Ambulation/Gait General Gait Details: on 100% NRB mask wit desaturation to 79% and very slow recovery to 88%    Exercises General Exercises - Lower Extremity Ankle Circles/Pumps: AROM;Both;10 reps;Seated Long Arc Quad: AROM;Both;5 reps;Seated Hip Flexion/Marching: AROM;Both;5 reps   PT Diagnosis: Difficulty walking  PT Problem List: Decreased strength;Decreased activity tolerance;Decreased balance;Decreased mobility;Decreased knowledge of use of DME;Decreased safety awareness;Decreased knowledge of precautions;Cardiopulmonary status limiting activity PT Treatment Interventions: DME instruction;Gait training;Functional mobility training;Therapeutic activities;Therapeutic exercise;Balance training;Patient/family education     PT Goals(Current goals can be found in the care plan section) Acute Rehab PT Goals Patient Stated Goal: regain independence PT Goal Formulation: With patient Time For Goal Achievement: 05/04/13 Potential to Achieve Goals: Good  Visit Information  Last PT Received On: 04/20/13 Assistance Needed: +2 (lines) History of Present Illness: Pt is 68 yo male presenting to [facility] ED with main concern of several days duration of progressively worsening shortness of breath. +flu with acute respiratory failure. PMHx includes atrial fibrillation on Coumadin and amiodarone,  HTN, HLD, systolic CHF with EF 40 %       Prior Functioning  Home Living Family/patient expects to be discharged to:: Private residence Living Arrangements: Spouse/significant other (lives with girlfriend) Available Help at Discharge: Family (girlfriend, wife ("it's complicated"), children) Type of Home: Apartment (condo) Home Access: Level entry Home Layout: Two level;Able to live on main level with bedroom/bathroom Home Equipment: None Prior Function Level of Independence: Independent Communication Communication: Other (comment) (limited by dyspnea and desaturation)    Cognition   Cognition Arousal/Alertness: Awake/alert Behavior During Therapy: WFL for tasks assessed/performed Overall Cognitive Status: Within Functional Limits for tasks assessed    Extremity/Trunk Assessment Upper Extremity  Assessment Upper Extremity Assessment: Overall WFL for tasks assessed Lower Extremity Assessment Lower Extremity Assessment: Overall WFL for tasks assessed Cervical / Trunk Assessment Cervical / Trunk Assessment: Kyphotic   Balance Balance Overall balance assessment: Needs assistance Sitting-balance support: No upper extremity supported;Feet unsupported Sitting balance-Leahy Scale: Good Standing balance support: Single extremity supported Standing balance-Leahy Scale: Fair General Comments General comments (skin integrity, edema, etc.): Frequent rest breaks due to desaturation  End of Session PT - End of Session Equipment Utilized During Treatment: Gait belt;Oxygen Activity Tolerance: Treatment limited secondary to medical complications (Comment) (desaturates) Patient left: in chair;with call bell/phone within reach;with nursing/sitter in room (in wheelchair to prepare for transfer to floor) Nurse Communication: Mobility status;Other (comment) (desaturation)  GP     Michaelah Credeur Sissel 04/20/2013, 5:01 PM Pager [phone]

## 2013-04-20 NOTE — Progress Notes (Signed)
PULMONARY / CRITICAL CARE MEDICINE  Name: Connor Ford MRN: [id number] DOB: [birth year]    ADMISSION DATE:  04/19/2013 CONSULTATION DATE:  04/13/2013  REFERRING MD :  [facility] PRIMARY SERVICE:  PCCM  CHIEF COMPLAINT:  Acute respiratory failure  BRIEF PATIENT DESCRIPTION: 68 y/o w/ PMHx of systolic CHF and sleep apnea admitted on 1/15 with dyspnea, fever, and pulmonary infiltrates. On 1/16, patient developed acute respiratory distress and CXR demonstrated worsening bilateral airspace disease.  PCCM was consulted.  SIGNIFICANT EVENTS / STUDIES:  1/15  Admitted for pneumonia 1/16  Transferred to ICU/Intubated 1/19  Extubated  LINES / TUBES: ETT 1/16 >>> 1/19 CVL 1/16 >>>1/22 R. Radial A-line 1/16 >>> 1/20  CULTURES: 1/15 Blood >>> 1/15 Urine >>>no growth 1/15 Resp virus panel nasal swab >>> neg 1/17 Resp >>>few candida, negative 1/18 Resp virus panel trach aspir >>> Flu A positive 1/19 Blood >>>  ANTIBIOTICS: Ceftriaxone 1/15 >>> 1/16 Azithromycin 1/15 >>> 1/16  Levaquin 1/16 >>> 1/16 Tamiflu 1/16 >>> 1/19 Vancomycin 1/16 >>> 1/21 Zosyn 1/16 >>> 1/20 Rocephin 1/20 >>>1/23 Levaquin 1/23 >>>  INTERVAL HISTORY: Diuresed out 2.5 L yesterday and feeling okay this morning. Still anxiety about dyspnea with exertion and he does not want to do much of anything for fear of his levels going down. He did not sleep much and has not been able to eat most of his meals due to dyspnea. No chest pain, abdominal pain, moved his bowels yesterday.   VITAL SIGNS: Temp:  [97.5 F (36.4 C)-99.3 F (37.4 C)] 97.7 F (36.5 C) (01/23 0411) Pulse Rate:  [66-102] 73 (01/23 0600) Resp:  [14-39] 14 (01/23 0600) BP: (111-179)/(42-132) 148/59 mmHg (01/23 0600) SpO2:  [76 %-98 %] 93 % (01/23 0600) FiO2 (%):  [100 %] 100 % (01/23 0000)  HEMODYNAMICS:   VENTILATOR SETTINGS: Vent Mode:  [-] BIPAP FiO2 (%):  [100 %] 100 % Set Rate:  [8 bmp-10 bmp] 8 bmp  INTAKE / OUTPUT: Intake/Output     01/22  0701 - 01/23 0700 01/23 0701 - 01/24 0700   I.V. (mL/kg)     IV Piggyback 50    Total Intake(mL/kg) 50 (0.6)    Urine (mL/kg/hr) 2500 (1.3)    Total Output 2500     Net -2450          Stool Occurrence 1 x      PHYSICAL EXAMINATION: General:  No acute distress. On mask Neuro:  Awake, alert. Moves all limbs spontaneously. Follows commands. HEENT: PERRL. Cardiovascular: Rate controlled, rhythm irregularly irregular. 2/6 systolic murmur.  Lungs: Air entry equal bilaterally. Coarse breath sounds diffusely.  Abdomen:  Soft, nontender, bowel sounds present.  Musculoskeletal: No edema. Chronic venous stasis changes in LE's. Skin: Intact.   LABS:  CBC  Recent Labs Lab 04/17/13 0500 04/18/13 0500 04/19/13 0438  WBC 12.5* 11.0* 12.0*  HGB 11.0* 11.2* 11.5*  HCT 35.0* 34.3* 35.0*  PLT 343 357 437*   Coag's  Recent Labs Lab 04/18/13 0500 04/19/13 0438 04/20/13 0223  INR 2.71* 2.72* 2.42*   BMET  Recent Labs Lab 04/17/13 0500 04/18/13 0500 04/19/13 0438  NA 144 144 145  K 4.1 4.1 3.8  CL 109 107 106  CO2 27 27 28   BUN 28* 22 21  CREATININE 0.91 0.82 0.77  GLUCOSE 133* 116* 120*   Electrolytes  Recent Labs Lab 04/16/13 0400 04/17/13 0500 04/18/13 0500 04/19/13 0438  CALCIUM 8.3* 7.7* 7.7* 8.0*  MG 2.9*  --   --   --  Sepsis Markers  Recent Labs Lab 04/13/13 1810 04/14/13 0421 04/17/13 0500  LATICACIDVEN 1.5 1.4  --   PROCALCITON  --   --  0.12   ABG  Recent Labs Lab 04/15/13 1304 04/16/13 2344 04/17/13 0508  PHART 7.328* 7.425 7.453*  PCO2ART 52.2* 43.6 38.7  PO2ART 96.0 56.0* 46.0*   Liver Enzymes  Recent Labs Lab 04/14/13 0415 04/16/13 0400 04/18/13 0500  AST 151* 69* 64*  ALT 104* 59* 37  ALKPHOS 110 85 66  BILITOT 0.9 0.5 0.9  ALBUMIN 2.2* 1.9* 1.8*   Cardiac Enzymes  Recent Labs Lab 04/13/13 1810 04/14/13 1110 04/19/13 1151  TROPONINI <0.30 <0.30  --   PROBNP  --   --  3144.0*   Glucose  Recent Labs Lab  04/19/13 0801 04/19/13 1123 04/19/13 1555 04/19/13 1913 04/20/13 0014 04/20/13 0402  GLUCAP 97 101* 130* 134* 106* 102*   CXR:  1/23 Improved pulmonary edema  ASSESSMENT / PLAN:  PULMONARY A:   Acute hypoxemic respiratory failure. Likely CAP 2/2 Flu A infection.  P:   BiPAP qhs prn Xopenex prn Albuterol neb q6h Try to de-escalate oxygen today  CARDIOVASCULAR A:  H/o CHF. ECHO shows normal EF.  Mod mitral stenosis, pulm HTN 20mmHg PAP AF. Rate controlled. On Coumadin at home. P:  Metoprolol prn to maintain HR <115 ASA Lasix 40 BID again today for more diuresis Lipitor Hold ACEI in setting of AKI  Coumadin  RENAL A:   AKI. Resolving Hypernatremia. Resolved.  P:   Hold ACEI Trend BMP   GASTROINTESTINAL A:   Elevated transaminases, resolved. Hepatitis panel -ve. P:   Diet regular  HEMATOLOGIC A:   Mild anemia. VTE Px. P:  Trend CBC Coumadin  INFECTIOUS A:   CAP 2/2 Flu A infection.  P:   D/C Rocephin, completed tamiflu Start levaquin PO with stop date 04/23/13  ENDOCRINE  A:   Normoglycemia.   P:   Monitor glucose on BMP  NEUROLOGIC A:   Hx ETOH P:   Ativan prn PO for anxiety  Vertell Novak, PGY-3 7:19 AM, 04/20/2013  PCCM ATTENDING: I have interviewed and examined the patient and reviewed the database. I have formulated the assessment and plan as reflected in the note above with amendments made by me.   He still requires high flow O2 but is very comfortable. Still using intermittent NPPV - esp @ night. Stable for trf to SDU. To remain on PCCM service  Merton Border, MD;  PCCM service; Mobile [phone]

## 2013-04-20 NOTE — Progress Notes (Signed)
Placed patient on Bipap QHS 14/7 and 80% f 8.  Patient is wearing a full face mask and tolerating well.  HR 96, RR 29, 02 Saturation 94%.  BP 134/66

## 2013-04-21 LAB — CBC
HCT: 34.6 % — ABNORMAL LOW (ref 39.0–52.0)
Hemoglobin: 11.7 g/dL — ABNORMAL LOW (ref 13.0–17.0)
MCH: 31.6 pg (ref 26.0–34.0)
MCHC: 33.8 g/dL (ref 30.0–36.0)
MCV: 93.5 fL (ref 78.0–100.0)
Platelets: 605 10*3/uL — ABNORMAL HIGH (ref 150–400)
RBC: 3.7 MIL/uL — AB (ref 4.22–5.81)
RDW: 14.5 % (ref 11.5–15.5)
WBC: 12.4 10*3/uL — ABNORMAL HIGH (ref 4.0–10.5)

## 2013-04-21 LAB — COMPREHENSIVE METABOLIC PANEL
ALT: 92 U/L — ABNORMAL HIGH (ref 0–53)
AST: 144 U/L — AB (ref 0–37)
Albumin: 1.7 g/dL — ABNORMAL LOW (ref 3.5–5.2)
Alkaline Phosphatase: 100 U/L (ref 39–117)
BILIRUBIN TOTAL: 0.8 mg/dL (ref 0.3–1.2)
BUN: 22 mg/dL (ref 6–23)
CHLORIDE: 100 meq/L (ref 96–112)
CO2: 28 meq/L (ref 19–32)
CREATININE: 0.83 mg/dL (ref 0.50–1.35)
Calcium: 7.8 mg/dL — ABNORMAL LOW (ref 8.4–10.5)
GFR calc Af Amer: >90 mL/min (ref >90)
GFR, EST NON AFRICAN AMERICAN: 89 mL/min — AB (ref >90)
Glucose, Bld: 106 mg/dL — ABNORMAL HIGH (ref 70–99)
Potassium: 3.8 mEq/L (ref 3.7–5.3)
Sodium: 139 mEq/L (ref 137–147)
Total Protein: 6.1 g/dL (ref 6.0–8.3)

## 2013-04-21 LAB — PROTIME-INR
INR: 1.86 — AB (ref 0.00–1.49)
PROTHROMBIN TIME: 20.9 s — AB (ref 11.6–15.2)

## 2013-04-21 MED ORDER — WARFARIN SODIUM 5 MG PO TABS
5.0000 mg | ORAL_TABLET | Freq: Once | ORAL | Status: AC
  Administered 2013-04-21: 5 mg via ORAL
  Filled 2013-04-21: qty 1

## 2013-04-21 NOTE — Progress Notes (Signed)
ANTICOAGULATION CONSULT NOTE - Follow Up Consult  Pharmacy Consult for Coumadin Indication: atrial fibrillation  No Known Allergies  Patient Measurements: Height: 6\' 4"  (193 cm) Weight: 196 lb 10.4 oz (89.2 kg) IBW/kg (Calculated) : 86.8  Vital Signs: Temp: 97.7 F (36.5 C) (01/24 1201) Temp src: Oral (01/24 1201) BP: 122/65 mmHg (01/24 1201) Pulse Rate: 104 (01/24 1201)  Labs:  Recent Labs  04/19/13 0438 04/20/13 0223 04/21/13 0400  HGB 11.5*  --  11.7*  HCT 35.0*  --  34.6*  PLT 437*  --  605*  LABPROT 27.9* 25.5* 20.9*  INR 2.72* 2.42* 1.86*  CREATININE 0.77  --  0.83    Estimated Creatinine Clearance: 106 ml/min (by C-G formula based on Cr of 0.83).   Assessment:  68 yo M being treated for PNA inpatient with a history of atrial fibrillation and on chronic Coumadin therapy prior to admission (2.5mg  alternating with 5mg ). Pharmacy was consulted to continue Coumadin therapy.  INR has decreased to subtherapeutic today at 1.86.  Of note, patient is receiving levofloxacin (stop date of 1/26), which can increase the effects of Coumadin.  H/H is low but stable, and Plt are elevated at 605.  No issues with bleeding noted.   Goal of Therapy:  INR 2-3 Monitor platelets by anticoagulation protocol: Yes   Plan:  - give Coumadin PO 5mg  x1 dose tonight  - daily PT/INR - monitor for s/s of bleeding  Ovid Curd E. Jacqlyn Larsen, PharmD Clinical Pharmacist - Resident Pager: [phone] Pharmacy: [phone] 04/21/2013 3:30 PM

## 2013-04-21 NOTE — Progress Notes (Signed)
PULMONARY / CRITICAL CARE MEDICINE  Name: Connor Ford MRN: [id number] DOB: 09-21-1945    ADMISSION DATE:  04/18/2013 CONSULTATION DATE:  04/13/2013  REFERRING MD :  [facility] PRIMARY SERVICE:  PCCM  CHIEF COMPLAINT:  Acute respiratory failure  BRIEF PATIENT DESCRIPTION: 68 y/o w/ PMHx of systolic CHF and sleep apnea admitted on 1/15 with dyspnea, fever, and pulmonary infiltrates. On 1/16, patient developed acute respiratory distress and CXR demonstrated worsening bilateral airspace disease.  PCCM was consulted.  SIGNIFICANT EVENTS / STUDIES:  1/15  Admitted for pneumonia 1/16  Transferred to ICU/Intubated 1/19  Extubated  LINES / TUBES: ETT 1/16 >>> 1/19 CVL 1/16 >>>1/22 R. Radial A-line 1/16 >>> 1/20  CULTURES: 1/15 Blood >>> 1/15 Urine >>>no growth 1/15 Resp virus panel nasal swab >>> neg 1/17 Resp >>>few candida, negative 1/18 Resp virus panel trach aspir >>> Flu A positive 1/19 Blood >>>  ANTIBIOTICS: Ceftriaxone 1/15 >>> 1/16 Azithromycin 1/15 >>> 1/16  Levaquin 1/16 >>> 1/16 Tamiflu 1/16 >>> 1/19 Vancomycin 1/16 >>> 1/21 Zosyn 1/16 >>> 1/20 Rocephin 1/20 >>>1/23 Levaquin 1/23 >>>   INTERVAL HISTORY: Diuresing & feeling better. Still anxiety about dyspnea with exertion but overall breathing better he says...  VITAL SIGNS: Temp:  [97.4 F (36.3 C)-98.8 F (37.1 C)] 98.1 F (36.7 C) (01/24 0800) Pulse Rate:  [25-106] 25 (01/24 0800) Resp:  [20-36] 20 (01/24 0800) BP: (112-166)/(49-70) 127/53 mmHg (01/24 0800) SpO2:  [92 %-98 %] 93 % (01/24 0800) FiO2 (%):  [80 %] 80 % (01/23 2351) Weight:  [89.2 kg (196 lb 10.4 oz)-90.9 kg (200 lb 6.4 oz)] 89.2 kg (196 lb 10.4 oz) (01/24 0431)  HEMODYNAMICS:   VENTILATOR SETTINGS: Vent Mode:  [-]  FiO2 (%):  [80 %] 80 % Set Rate:  [8 bmp] 8 bmp  INTAKE / OUTPUT: Intake/Output     01/23 0701 - 01/24 0700 01/24 0701 - 01/25 0700   P.O. 840    IV Piggyback     Total Intake(mL/kg) 840 (9.4)    Urine (mL/kg/hr) 1865  (0.9) 400 (1.4)   Total Output 1865 400   Net -1025 -400        Stool Occurrence 1 x     PHYSICAL EXAMINATION: General:  No acute distress. On mask Neuro:  Awake, alert. Moves all limbs spontaneously. Follows commands. HEENT: PERRL. Cardiovascular: Rate controlled, rhythm irregularly irregular. 2/6 systolic murmur.  Lungs: Air entry equal bilaterally. Coarse breath sounds diffusely.  Abdomen:  Soft, nontender, bowel sounds present.  Musculoskeletal: No edema. Chronic venous stasis changes in LE's. Skin: Intact.   LABS:  CBC  Recent Labs Lab 04/18/13 0500 04/19/13 0438 04/21/13 0400  WBC 11.0* 12.0* 12.4*  HGB 11.2* 11.5* 11.7*  HCT 34.3* 35.0* 34.6*  PLT 357 437* 605*   Coag's  Recent Labs Lab 04/19/13 0438 04/20/13 0223 04/21/13 0400  INR 2.72* 2.42* 1.86*   BMET  Recent Labs Lab 04/18/13 0500 04/19/13 0438 04/21/13 0400  NA 144 145 139  K 4.1 3.8 3.8  CL 107 106 100  CO2 27 28 28   BUN 22 21 22   CREATININE 0.82 0.77 0.83  GLUCOSE 116* 120* 106*   Electrolytes  Recent Labs Lab 04/16/13 0400  04/18/13 0500 04/19/13 0438 04/21/13 0400  CALCIUM 8.3*  < > 7.7* 8.0* 7.8*  MG 2.9*  --   --   --   --   < > = values in this interval not displayed.  Sepsis Markers  Recent Labs Lab 04/17/13 0500  PROCALCITON 0.12   ABG  Recent Labs Lab 04/15/13 1304 04/16/13 2344 04/17/13 0508  PHART 7.328* 7.425 7.453*  PCO2ART 52.2* 43.6 38.7  PO2ART 96.0 56.0* 46.0*   Liver Enzymes  Recent Labs Lab 04/16/13 0400 04/18/13 0500 04/21/13 0400  AST 69* 64* 144*  ALT 59* 37 92*  ALKPHOS 85 66 100  BILITOT 0.5 0.9 0.8  ALBUMIN 1.9* 1.8* 1.7*   Cardiac Enzymes  Recent Labs Lab 04/14/13 1110 04/19/13 1151  TROPONINI <0.30  --   PROBNP  --  3144.0*   Glucose  Recent Labs Lab 04/19/13 1913 04/20/13 0014 04/20/13 0402 04/20/13 0743 04/20/13 1145 04/20/13 1539  GLUCAP 134* 106* 102* 105* 124* 152*   CXR:  1/23 Improved pulmonary  edema  ASSESSMENT / PLAN:  PULMONARY A:   Acute hypoxemic respiratory failure. Likely CAP 2/2 Flu A infection.  P:   BiPAP qhs prn Xopenex prn Albuterol neb q6h Try to de-escalate oxygen today  CARDIOVASCULAR A:  H/o CHF. ECHO shows normal EF.  Mod mitral stenosis, pulm HTN 12mmHg PAP AF. Rate controlled. On Coumadin at home. P:  Metoprolol prn to maintain HR <115 ASA Lasix 40 BID again today for more diuresis Lipitor Hold ACEI in setting of AKI  Coumadin  RENAL A:   AKI. Resolving Hypernatremia. Resolved.  P:   Hold ACEI Trend BMP   GASTROINTESTINAL A:   Elevated transaminases & low Alb Hepatitis panel => neg P:   Diet regular  HEMATOLOGIC A:   Mild anemia. VTE Px. P:  Trend CBC Coumadin  INFECTIOUS A:   CAP 2/2 Flu A infection.  P:   D/C Rocephin, completed tamiflu Start levaquin PO with stop date 04/23/13  ENDOCRINE  A:   Normoglycemia.   P:   Monitor glucose on BMP  NEUROLOGIC A:   Hx ETOH P:   Ativan prn PO for anxiety    Milynn Quirion M, 10:17 AM, 04/21/2013 [facility]

## 2013-04-22 LAB — CULTURE, BLOOD (ROUTINE X 2)
CULTURE: NO GROWTH
Culture: NO GROWTH

## 2013-04-22 LAB — PROTIME-INR
INR: 1.85 — ABNORMAL HIGH (ref 0.00–1.49)
PROTHROMBIN TIME: 20.8 s — AB (ref 11.6–15.2)

## 2013-04-22 MED ORDER — FUROSEMIDE 40 MG PO TABS
40.0000 mg | ORAL_TABLET | Freq: Once | ORAL | Status: AC
  Administered 2013-04-22: 40 mg via ORAL
  Filled 2013-04-22: qty 1

## 2013-04-22 MED ORDER — WARFARIN SODIUM 5 MG PO TABS
5.0000 mg | ORAL_TABLET | Freq: Once | ORAL | Status: AC
  Administered 2013-04-22: 5 mg via ORAL
  Filled 2013-04-22: qty 1

## 2013-04-22 NOTE — Progress Notes (Signed)
ANTICOAGULATION CONSULT NOTE - Follow Up Consult  Pharmacy Consult for Coumadin Indication: atrial fibrillation  No Known Allergies  Patient Measurements: Height: 6\' 4"  (193 cm) Weight: 198 lb 6.6 oz (90 kg) IBW/kg (Calculated) : 86.8  Vital Signs: Temp: 97.8 F (36.6 C) (01/25 1200) Temp src: Oral (01/25 1200) BP: 130/75 mmHg (01/25 1200) Pulse Rate: 80 (01/25 1145)  Labs:  Recent Labs  04/20/13 0223 04/21/13 0400 04/22/13 0410  HGB  --  11.7*  --   HCT  --  34.6*  --   PLT  --  605*  --   LABPROT 25.5* 20.9* 20.8*  INR 2.42* 1.86* 1.85*  CREATININE  --  0.83  --     Estimated Creatinine Clearance: 106 ml/min (by C-G formula based on Cr of 0.83).   Assessment:  68 yo M being treated for PNA inpatient with a history of atrial fibrillation and on chronic Coumadin therapy prior to admission (2.5mg  alternating with 5mg ). Pharmacy was consulted to continue Coumadin therapy.  INR today is about the same, subtherapeutic (1.86>>1.85) after 5mg  dose last night.   Of note, patient is receiving levofloxacin (stop date of 1/26), which can increase the effects of Coumadin.  Last H/H on 1/24 was low but stable, and Plt were elevated at 605.  No issues with bleeding noted.   Goal of Therapy:  INR 2-3 Monitor platelets by anticoagulation protocol: Yes   Plan:  - repeat Coumadin PO 5mg  x1 dose tonight  - daily PT/INR - monitor for s/s of bleeding  Ovid Curd E. Jacqlyn Larsen, PharmD Clinical Pharmacist - Resident Pager: [phone] Pharmacy: [phone] 04/22/2013 2:18 PM

## 2013-04-22 NOTE — Progress Notes (Signed)
PULMONARY / CRITICAL CARE MEDICINE  Name: Connor Ford MRN: [id number] DOB: 09-27-1945    ADMISSION DATE:  04/11/2013 CONSULTATION DATE:  04/13/2013  REFERRING MD :  [facility] PRIMARY SERVICE:  PCCM  CHIEF COMPLAINT:  Acute respiratory failure  BRIEF PATIENT DESCRIPTION: 68 y/o w/ PMHx of systolic CHF and sleep apnea admitted on 1/15 with dyspnea, fever, and pulmonary infiltrates. On 1/16, patient developed acute respiratory distress and CXR demonstrated worsening bilateral airspace disease.  PCCM was consulted.  SIGNIFICANT EVENTS / STUDIES:  1/15  Admitted for pneumonia 1/16  Transferred to ICU/Intubated 1/19  Extubated  LINES / TUBES: ETT 1/16 >>> 1/19 CVL 1/16 >>>1/22 R. Radial A-line 1/16 >>> 1/20  CULTURES: 1/15 Blood >>> 1/15 Urine >>>no growth 1/15 Resp virus panel nasal swab >>> neg 1/17 Resp >>>few candida, negative 1/18 Resp virus panel trach aspir >>> Flu A positive 1/19 Blood >>>  ANTIBIOTICS: Ceftriaxone 1/15 >>> 1/16 Azithromycin 1/15 >>> 1/16  Levaquin 1/16 >>> 1/16 Tamiflu 1/16 >>> 1/19 Vancomycin 1/16 >>> 1/21 Zosyn 1/16 >>> 1/20 Rocephin 1/20 >>>1/23 Levaquin 1/23 >>>  Sched Meds: . antiseptic oral rinse  15 mL Mouth Rinse QID  . aspirin  81 mg Oral QHS  . atorvastatin  20 mg Oral q1800  . chlorhexidine  15 mL Mouth Rinse BID  . feeding supplement (ENSURE COMPLETE)  237 mL Oral TID BM  . levofloxacin  750 mg Oral Daily  . sodium chloride  3 mL Intravenous Q12H  . Warfarin - Pharmacist Dosing Inpatient   Does not apply q1800    INTERVAL HISTORY: Diuresing & feeling better. Still anxiety about dyspnea with exertion but overall breathing better he says...  VITAL SIGNS: Temp:  [97.7 F (36.5 C)-99.4 F (37.4 C)] 97.7 F (36.5 C) (01/25 0800) Pulse Rate:  [80-105] 90 (01/25 0800) Resp:  [21-28] 25 (01/25 0800) BP: (119-145)/(57-71) 142/68 mmHg (01/25 0800) SpO2:  [85 %-98 %] 92 % (01/25 0800) FiO2 (%):  [80 %-100 %] 100 % (01/25 0610) Weight:   [90 kg (198 lb 6.6 oz)] 90 kg (198 lb 6.6 oz) (01/25 0430)  HEMODYNAMICS:   VENTILATOR SETTINGS: Vent Mode:  [-]  FiO2 (%):  [80 %-100 %] 100 %  INTAKE / OUTPUT: Intake/Output     01/24 0701 - 01/25 0700 01/25 0701 - 01/26 0700   P.O. 120    Total Intake(mL/kg) 120 (1.3)    Urine (mL/kg/hr) 2050 (0.9)    Total Output 2050     Net -1930           PHYSICAL EXAMINATION: General:  No acute distress. On mask Neuro:  Awake, alert. Moves all limbs spontaneously. Follows commands. HEENT: PERRL. Cardiovascular: Rate controlled, rhythm irregularly irregular. 2/6 systolic murmur.  Lungs: Air entry equal bilaterally. Coarse breath sounds diffusely.  Abdomen:  Soft, nontender, bowel sounds present.  Musculoskeletal: No edema. Chronic venous stasis changes in LE's. Skin: Intact.   LABS:  CBC  Recent Labs Lab 04/18/13 0500 04/19/13 0438 04/21/13 0400  WBC 11.0* 12.0* 12.4*  HGB 11.2* 11.5* 11.7*  HCT 34.3* 35.0* 34.6*  PLT 357 437* 605*   Coag's  Recent Labs Lab 04/20/13 0223 04/21/13 0400 04/22/13 0410  INR 2.42* 1.86* 1.85*   BMET  Recent Labs Lab 04/18/13 0500 04/19/13 0438 04/21/13 0400  NA 144 145 139  K 4.1 3.8 3.8  CL 107 106 100  CO2 27 28 28   BUN 22 21 22   CREATININE 0.82 0.77 0.83  GLUCOSE 116* 120* 106*  Electrolytes  Recent Labs Lab 04/16/13 0400  04/18/13 0500 04/19/13 0438 04/21/13 0400  CALCIUM 8.3*  < > 7.7* 8.0* 7.8*  MG 2.9*  --   --   --   --   < > = values in this interval not displayed.  Sepsis Markers  Recent Labs Lab 04/17/13 0500  PROCALCITON 0.12   ABG  Recent Labs Lab 04/15/13 1304 04/16/13 2344 04/17/13 0508  PHART 7.328* 7.425 7.453*  PCO2ART 52.2* 43.6 38.7  PO2ART 96.0 56.0* 46.0*   Liver Enzymes  Recent Labs Lab 04/16/13 0400 04/18/13 0500 04/21/13 0400  AST 69* 64* 144*  ALT 59* 37 92*  ALKPHOS 85 66 100  BILITOT 0.5 0.9 0.8  ALBUMIN 1.9* 1.8* 1.7*   Cardiac Enzymes  Recent Labs Lab  04/19/13 1151  PROBNP 3144.0*   Glucose  Recent Labs Lab 04/19/13 1913 04/20/13 0014 04/20/13 0402 04/20/13 0743 04/20/13 1145 04/20/13 1539  GLUCAP 134* 106* 102* 105* 124* 152*   CXR:  1/23 Improved pulmonary edema   ASSESSMENT / PLAN:  PULMONARY A:   Acute hypoxemic respiratory failure. Likely CAP 2/2 Flu A infection.  P:   BiPAP qhs prn Xopenex prn Albuterol neb q6h Try to de-escalate oxygen today 1/25> f/u CXR in AM  CARDIOVASCULAR A:  H/o CHF. ECHO shows normal EF.  Mod mitral stenosis, pulm HTN 62mmHg PAP AF. Rate controlled. On Coumadin at home. P:  Metoprolol prn to maintain HR <115 ASA Lipitor Hold ACEI in setting of AKI  Coumadin 1/25> Lasix 40 today to aide diuresis; check CMet in AM....  RENAL A:   AKI. Resolving Hypernatremia. Resolved.  P:   Hold ACEI Trend BMP   GASTROINTESTINAL A:   Elevated transaminases & low Alb Hepatitis panel => neg P:   Diet regular  HEMATOLOGIC A:   Mild anemia. VTE Px. P:  Trend CBC Coumadin  INFECTIOUS A:   CAP 2/2 Flu A infection.  P:   D/C Rocephin, completed tamiflu Start levaquin PO with stop date 04/23/13  ENDOCRINE  A:   Normoglycemia.   P:   Monitor glucose on BMP  NEUROLOGIC A:   Hx ETOH P:   Ativan prn PO for anxiety    Connor Ford M, 10:04 AM, 04/22/2013 [facility]

## 2013-04-23 ENCOUNTER — Inpatient Hospital Stay (HOSPITAL_COMMUNITY): Payer: Medicare PPO

## 2013-04-23 LAB — COMPREHENSIVE METABOLIC PANEL
ALT: 129 U/L — ABNORMAL HIGH (ref 0–53)
AST: 183 U/L — ABNORMAL HIGH (ref 0–37)
Albumin: 1.7 g/dL — ABNORMAL LOW (ref 3.5–5.2)
Alkaline Phosphatase: 115 U/L (ref 39–117)
BILIRUBIN TOTAL: 0.7 mg/dL (ref 0.3–1.2)
BUN: 17 mg/dL (ref 6–23)
CHLORIDE: 99 meq/L (ref 96–112)
CO2: 30 meq/L (ref 19–32)
CREATININE: 0.92 mg/dL (ref 0.50–1.35)
Calcium: 7.8 mg/dL — ABNORMAL LOW (ref 8.4–10.5)
GFR calc Af Amer: >90 mL/min (ref >90)
GFR, EST NON AFRICAN AMERICAN: 85 mL/min — AB (ref >90)
Glucose, Bld: 108 mg/dL — ABNORMAL HIGH (ref 70–99)
Potassium: 4.3 mEq/L (ref 3.7–5.3)
Sodium: 138 mEq/L (ref 137–147)
Total Protein: 5.8 g/dL — ABNORMAL LOW (ref 6.0–8.3)

## 2013-04-23 LAB — PROTIME-INR
INR: 2.11 — ABNORMAL HIGH (ref 0.00–1.49)
PROTHROMBIN TIME: 23 s — AB (ref 11.6–15.2)

## 2013-04-23 MED ORDER — WARFARIN SODIUM 5 MG PO TABS
5.0000 mg | ORAL_TABLET | Freq: Once | ORAL | Status: AC
  Administered 2013-04-23: 5 mg via ORAL
  Filled 2013-04-23: qty 1

## 2013-04-23 MED ORDER — FUROSEMIDE 40 MG PO TABS
40.0000 mg | ORAL_TABLET | Freq: Every day | ORAL | Status: DC
  Administered 2013-04-23 – 2013-04-28 (×6): 40 mg via ORAL
  Filled 2013-04-23 (×8): qty 1

## 2013-04-23 MED ORDER — METHYLPREDNISOLONE SODIUM SUCC 125 MG IJ SOLR
60.0000 mg | Freq: Three times a day (TID) | INTRAMUSCULAR | Status: DC
  Administered 2013-04-23 – 2013-04-26 (×10): 60 mg via INTRAVENOUS
  Filled 2013-04-23 (×12): qty 0.96

## 2013-04-23 NOTE — Progress Notes (Signed)
I was called to bedside by PT d/t pt desat to 84% on NRB.  Pt c/o mild SOB.  Placed pt back on bipap.  Sats increased to 90-92% on bipap.  Pt states SOB improving.  RT covering floor aware.  VSS

## 2013-04-23 NOTE — Progress Notes (Signed)
Pt does not wish to wear Bipap tonight. He would like to try being off of it while he slept. Pt stated that his SpO2 did well when he took a nap today.  He also stated it is very uncomfortable and does not sleep well with it on. Pt currently on NRB SpO2 between 92-97%. Pt states he feels that he has greatly improved throughout the day. Pt encouraged to call RT if pt feels short of breath or needs to be placed back on Bipap. RT will check on pt during 0400 rounds to check SpO2 and WOB. PT sitting in chair comfortably watching tv.

## 2013-04-23 NOTE — Progress Notes (Signed)
Physical Therapy Treatment Patient Details Name: Connor Ford MRN: [id number] DOB: 1945/07/20 Today's Date: 04/23/2013 Time: 1517-6160 PT Time Calculation (min): 27 min  PT Assessment / Plan / Recommendation  History of Present Illness Pt is 68 yo male presenting to [facility] ED with main concern of several days duration of progressively worsening shortness of breath. +flu with acute respiratory failure. ETT 1/16-1/19. PMHx includes atrial fibrillation on Coumadin and amiodarone,  HTN, HLD, systolic CHF with EF 40 %   PT Comments   Pt with limited progression today due to continued respiratory difficulty. Pt on NRB on arrival with sats 89% pt with cueing for breathing technique and inconsistent wave form with drop to 86% with transfer to EOB. Pt without report of SOB and recovery to 88%, with transfer to chair drop to 84% with slow recovery to 89%. With bil LE exercises brief jumps actually to 94% but varied from 85-90% majority of time. At rest with breathing cues in chair pt remaining 85% and respiratory notified with transfer back to Bipap due to lack of recovery today. Pt educated for HEP to perform daily with sats increased and to be OOB to chair daily with nursing assist. Will continue to follow to maximize mobility.  Follow Up Recommendations  Home health PT;Supervision for mobility/OOB     Does the patient have the potential to tolerate intense rehabilitation     Barriers to Discharge        Equipment Recommendations       Recommendations for Other Services    Frequency     Progress towards PT Goals Progress towards PT goals: Progressing toward goals  Plan Current plan remains appropriate    Precautions / Restrictions Precautions Precautions: Fall;Other (comment) Precaution Comments: droplet, watch sats   Pertinent Vitals/Pain No pain    Mobility  Bed Mobility Overal bed mobility: Modified Independent Transfers Transfers: Sit to/from Stand;Stand Pivot Transfers Sit to  Stand: Min guard Stand pivot transfers: Min guard General transfer comment: guarding for lines and safety    Exercises General Exercises - Lower Extremity Long Arc Quad: AROM;Both;Seated;10 reps Hip Flexion/Marching: AROM;Both;Seated;20 reps   PT Diagnosis:    PT Problem List:   PT Treatment Interventions:     PT Goals (current goals can now be found in the care plan section)    Visit Information  Last PT Received On: 04/23/13 Assistance Needed: +1 History of Present Illness: Pt is 68 yo male presenting to [facility] ED with main concern of several days duration of progressively worsening shortness of breath. +flu with acute respiratory failure. ETT 1/16-1/19. PMHx includes atrial fibrillation on Coumadin and amiodarone,  HTN, HLD, systolic CHF with EF 40 %    Subjective Data      Cognition  Cognition Arousal/Alertness: Awake/alert Behavior During Therapy: WFL for tasks assessed/performed Overall Cognitive Status: Within Functional Limits for tasks assessed    Balance     End of Session PT - End of Session Equipment Utilized During Treatment: Oxygen Activity Tolerance: Treatment limited secondary to medical complications (Comment) Patient left: in chair;with call bell/phone within reach Nurse Communication: Mobility status;Other (comment)   GP     [name] [name] 04/23/2013, 10:38 AM [name], [place]

## 2013-04-23 NOTE — Progress Notes (Signed)
ANTICOAGULATION CONSULT NOTE - Follow Up Consult  Pharmacy Consult for Coumadin Indication: atrial fibrillation  No Known Allergies  Patient Measurements: Height: 6\' 4"  (193 cm) Weight: 198 lb 6.6 oz (90 kg) IBW/kg (Calculated) : 86.8  Vital Signs: Temp: 98.7 F (37.1 C) (01/26 0740) Temp src: Axillary (01/26 0740) BP: 143/59 mmHg (01/26 0740) Pulse Rate: 109 (01/26 0811)  Labs:  Recent Labs  04/21/13 0400 04/22/13 0410 04/23/13 0150  HGB 11.7*  --   --   HCT 34.6*  --   --   PLT 605*  --   --   LABPROT 20.9* 20.8* 23.0*  INR 1.86* 1.85* 2.11*  CREATININE 0.83  --  0.92    Estimated Creatinine Clearance: 95.7 ml/min (by C-G formula based on Cr of 0.92).  Assessment:  89 yom continues on coumadin for hx afib. INR is therapeutic today at 2.11. No new CBC. No bleeding noted. Pt is also on levaquin which may increase his INR, however this is stopping today.    Goal of Therapy:  INR 2-3   Plan:  1. Coumadin 5mg  PO x 1 tonight 2. F/u AM INR  Salome Arnt, PharmD, BCPS Pager # [phone] 04/23/2013 8:49 AM

## 2013-04-23 NOTE — Progress Notes (Addendum)
PULMONARY / CRITICAL CARE MEDICINE  Name: Connor Ford MRN: [id number] DOB: 09/17/1945    ADMISSION DATE:  04/05/2013 CONSULTATION DATE:  04/13/2013  REFERRING MD :  [facility] PRIMARY SERVICE:  PCCM  CHIEF COMPLAINT:  Acute respiratory failure  BRIEF PATIENT DESCRIPTION: 68 y/o w/ PMHx of systolic CHF and sleep apnea admitted on 1/15 with dyspnea, fever, and pulmonary infiltrates. On 1/16, patient developed acute respiratory distress and CXR demonstrated worsening bilateral airspace disease.  PCCM was consulted.  SIGNIFICANT EVENTS / STUDIES:  1/15  Admitted for pneumonia 1/16  Transferred to ICU/Intubated 1/19  Extubated 1/26  Unable to maintain sats on 100% and replaced on BiPAP.  LINES / TUBES: ETT 1/16 >>> 1/19 CVL 1/16 >>>1/22 R. Radial A-line 1/16 >>> 1/20  CULTURES: 1/15 Blood >>> 1/15 Urine >>>no growth 1/15 Resp virus panel nasal swab >>> neg 1/17 Resp >>>few candida, negative 1/18 Resp virus panel trach aspir >>> Flu A positive 1/19 Blood >>>  ANTIBIOTICS: Ceftriaxone 1/15 >>> 1/16 Azithromycin 1/15 >>> 1/16  Levaquin 1/16 >>> 1/16 Tamiflu 1/16 >>> 1/19 Vancomycin 1/16 >>> 1/21 Zosyn 1/16 >>> 1/20 Rocephin 1/20 >>>1/23 Levaquin 1/23 >>>  Sched Meds: . antiseptic oral rinse  15 mL Mouth Rinse QID  . aspirin  81 mg Oral QHS  . atorvastatin  20 mg Oral q1800  . chlorhexidine  15 mL Mouth Rinse BID  . feeding supplement (ENSURE COMPLETE)  237 mL Oral TID BM  . levofloxacin  750 mg Oral Daily  . sodium chloride  3 mL Intravenous Q12H  . warfarin  5 mg Oral ONCE-1800  . Warfarin - Pharmacist Dosing Inpatient   Does not apply q1800    INTERVAL HISTORY: Diuresed but unable to maintain sats overnight on 100%, replaced back on BiPAP.Marland Kitchen  VITAL SIGNS: Temp:  [97.8 F (36.6 C)-99.2 F (37.3 C)] 98.7 F (37.1 C) (01/26 0740) Pulse Rate:  [80-110] 110 (01/26 0932) Resp:  [24-35] 25 (01/26 0932) BP: (110-143)/(45-75) 129/59 mmHg (01/26 0932) SpO2:  [88 %-98 %] 90  % (01/26 0932)  HEMODYNAMICS:   VENTILATOR SETTINGS:    INTAKE / OUTPUT: Intake/Output     01/25 0701 - 01/26 0700 01/26 0701 - 01/27 0700   P.O. 120    Total Intake(mL/kg) 120 (1.3)    Urine (mL/kg/hr) 1100 (0.5)    Total Output 1100     Net -980          Stool Occurrence 1 x     PHYSICAL EXAMINATION: General:  No acute distress. On mask Neuro:  Awake, alert. Moves all limbs spontaneously. Follows commands. HEENT: PERRL. Cardiovascular: Rate controlled, rhythm irregularly irregular. 2/6 systolic murmur.  Lungs: Air entry equal bilaterally. Coarse breath sounds diffusely.  Abdomen:  Soft, nontender, bowel sounds present.  Musculoskeletal: No edema. Chronic venous stasis changes in LE's. Skin: Intact.   LABS:  CBC  Recent Labs Lab 04/18/13 0500 04/19/13 0438 04/21/13 0400  WBC 11.0* 12.0* 12.4*  HGB 11.2* 11.5* 11.7*  HCT 34.3* 35.0* 34.6*  PLT 357 437* 605*   Coag's  Recent Labs Lab 04/21/13 0400 04/22/13 0410 04/23/13 0150  INR 1.86* 1.85* 2.11*   BMET  Recent Labs Lab 04/19/13 0438 04/21/13 0400 04/23/13 0150  NA 145 139 138  K 3.8 3.8 4.3  CL 106 100 99  CO2 28 28 30   BUN 21 22 17   CREATININE 0.77 0.83 0.92  GLUCOSE 120* 106* 108*   Electrolytes  Recent Labs Lab 04/19/13 0438 04/21/13 0400 04/23/13 0150  CALCIUM 8.0* 7.8* 7.8*    Sepsis Markers  Recent Labs Lab 04/17/13 0500  PROCALCITON 0.12   ABG  Recent Labs Lab 04/16/13 2344 04/17/13 0508  PHART 7.425 7.453*  PCO2ART 43.6 38.7  PO2ART 56.0* 46.0*   Liver Enzymes  Recent Labs Lab 04/18/13 0500 04/21/13 0400 04/23/13 0150  AST 64* 144* 183*  ALT 37 92* 129*  ALKPHOS 66 100 115  BILITOT 0.9 0.8 0.7  ALBUMIN 1.8* 1.7* 1.7*   Cardiac Enzymes  Recent Labs Lab 04/19/13 1151  PROBNP 3144.0*   Glucose  Recent Labs Lab 04/19/13 1913 04/20/13 0014 04/20/13 0402 04/20/13 0743 04/20/13 1145 04/20/13 1539  GLUCAP 134* 106* 102* 105* 124* 152*   CXR:   1/23 Improved pulmonary edema   ASSESSMENT / PLAN:  PULMONARY A:   Acute hypoxemic respiratory failure. Likely CAP 2/2 Flu A infection.  P:   - Place back on BiPAP. - Xopenex prn as ordered. - Albuterol neb q6h. - Will start low dose steroids today with the hope that we can descalate O2 needs as they are actually increasing right now.  CARDIOVASCULAR A:  H/o CHF. ECHO shows normal EF.  Mod mitral stenosis, pulm HTN 50mmHg PAP AF. Rate controlled. On Coumadin at home. P:  - Metoprolol prn to maintain HR <115. - ASA. - Lipitor. - Hold ACEI in setting of AKI. - Coumadin per pharmacy.  RENAL A:   AKI. Resolved Hypernatremia. Resolved.  P:   - Hold ACEI - Trend BMP  - PO lasix as ordered.  GASTROINTESTINAL A:   Elevated transaminases & low Alb Hepatitis panel => neg P:   - Diet regular as able.  HEMATOLOGIC A:   Mild anemia. VTE Px. P:  - Trend CBC. - Coumadin.  INFECTIOUS A:   CAP 2/2 Flu A infection.  P:   - D/Ced Rocephin, completed tamiflu. - D/C levaquin PO after today's dose.  ENDOCRINE  A:   Normoglycemia.   P:   - Monitor glucose on BMP.  NEUROLOGIC A:   Hx ETOH P:   - Ativan prn PO for anxiety.  Very concerned regarding increase in O2 demand and now need for BiPAP continuously to maintain sats.  Will continue to diurese and will start steroids today with the hope to quite down inflammation enough to help with O2 demand.  CC time 35 min.  Rush Farmer, M.D. [facility] Pulmonary/Critical Care Medicine. Pager: [phone]. After hours pager: [phone].

## 2013-04-24 ENCOUNTER — Inpatient Hospital Stay (HOSPITAL_COMMUNITY): Payer: Medicare PPO

## 2013-04-24 LAB — BASIC METABOLIC PANEL
BUN: 26 mg/dL — ABNORMAL HIGH (ref 6–23)
CHLORIDE: 97 meq/L (ref 96–112)
CO2: 28 mEq/L (ref 19–32)
Calcium: 8.1 mg/dL — ABNORMAL LOW (ref 8.4–10.5)
Creatinine, Ser: 0.81 mg/dL (ref 0.50–1.35)
GFR, EST NON AFRICAN AMERICAN: 90 mL/min — AB (ref >90)
Glucose, Bld: 141 mg/dL — ABNORMAL HIGH (ref 70–99)
Potassium: 3.7 mEq/L (ref 3.7–5.3)
SODIUM: 138 meq/L (ref 137–147)

## 2013-04-24 LAB — PROTIME-INR
INR: 2.4 — ABNORMAL HIGH (ref 0.00–1.49)
PROTHROMBIN TIME: 25.4 s — AB (ref 11.6–15.2)

## 2013-04-24 LAB — PHOSPHORUS: PHOSPHORUS: 4.5 mg/dL (ref 2.3–4.6)

## 2013-04-24 LAB — CBC
HCT: 35 % — ABNORMAL LOW (ref 39.0–52.0)
Hemoglobin: 11.8 g/dL — ABNORMAL LOW (ref 13.0–17.0)
MCH: 31.5 pg (ref 26.0–34.0)
MCHC: 33.7 g/dL (ref 30.0–36.0)
MCV: 93.3 fL (ref 78.0–100.0)
PLATELETS: 653 10*3/uL — AB (ref 150–400)
RBC: 3.75 MIL/uL — ABNORMAL LOW (ref 4.22–5.81)
RDW: 14.3 % (ref 11.5–15.5)
WBC: 21 10*3/uL — ABNORMAL HIGH (ref 4.0–10.5)

## 2013-04-24 LAB — MAGNESIUM: MAGNESIUM: 2.1 mg/dL (ref 1.5–2.5)

## 2013-04-24 MED ORDER — LORAZEPAM 0.5 MG PO TABS
0.5000 mg | ORAL_TABLET | ORAL | Status: DC | PRN
  Administered 2013-04-24 – 2013-04-26 (×3): 0.5 mg via ORAL
  Filled 2013-04-24 (×3): qty 1

## 2013-04-24 MED ORDER — WARFARIN SODIUM 2.5 MG PO TABS
2.5000 mg | ORAL_TABLET | Freq: Once | ORAL | Status: AC
  Administered 2013-04-24: 2.5 mg via ORAL
  Filled 2013-04-24: qty 1

## 2013-04-24 MED ORDER — FUROSEMIDE 10 MG/ML IJ SOLN
40.0000 mg | Freq: Three times a day (TID) | INTRAMUSCULAR | Status: AC
  Administered 2013-04-24 (×2): 40 mg via INTRAVENOUS
  Filled 2013-04-24: qty 4

## 2013-04-24 NOTE — Progress Notes (Addendum)
Physical Therapy Treatment Patient Details Name: ALIOUNE HODGKIN MRN: [id number] DOB: [birth year] Today's Date: 04/24/2013 Time: 1140-1155 PT Time Calculation (min): 15 min  PT Assessment / Plan / Recommendation  History of Present Illness Pt is 68 yo male presenting to [facility] ED with main concern of several days duration of progressively worsening shortness of breath. +flu with acute respiratory failure. ETT 1/16-1/19. PMHx includes atrial fibrillation on Coumadin and amiodarone,  HTN, HLD, systolic CHF with EF 40 %   PT Comments   Pt admitted with above. Pt currently with functional limitations due to significant endurance deficits.  Treatment limited today by pt desaturation at rest.  Pt will benefit from skilled PT to increase their independence and safety with mobility to allow discharge to the venue listed below.   Follow Up Recommendations  Home health PT;Supervision for mobility/OOB                 Equipment Recommendations  Rolling walker with 5" wheels;Other (comment) (home O2)        Frequency Min 3X/week   Progress towards PT Goals Progress towards PT goals: Not progressing toward goals - comment (desat at rest and unable to perform PT treatment)  Plan Current plan remains appropriate    Precautions / Restrictions Precautions Precautions: Fall;Other (comment) Precaution Comments: droplet, watch sats Restrictions Weight Bearing Restrictions: No   Pertinent Vitals/Pain Pt desat to 81% at rest on 50% FiO2, no pain    Mobility  Bed Mobility General bed mobility comments: Nursing ok'd and PT to try and work with pt.  Pt in chair on arrival.  Pt with sats 81-85% while talking with PT and on Venturi mask at 50% O2.  Noted that nonrebreather mask was in pts lap therefore placed it on and within 4 min O2 was up to 90%.  Nursing came in and checked on pt and stated that Dr. Nelda Marseille wanted pt to stay on venturi with parameter at 82% as he is possibly going to need Bipap vs  intubation.  Replaced pt at 50% O2 and let pt and nurse know that PT was not indicated at present with sats being that low at rest. Explained to pt that at present we cannot exercise secondary to his sats are too low at rest.       PT Goals (current goals can now be found in the care plan section)    Visit Information  Last PT Received On: 04/24/13 Assistance Needed: +1 History of Present Illness: Pt is 68 yo male presenting to [facility] ED with main concern of several days duration of progressively worsening shortness of breath. +flu with acute respiratory failure. ETT 1/16-1/19. PMHx includes atrial fibrillation on Coumadin and amiodarone,  HTN, HLD, systolic CHF with EF 40 %    Subjective Data  Subjective: "I dont understand why I have this mask on and not the other one.  "   Cognition  Cognition Arousal/Alertness: Awake/alert Behavior During Therapy: WFL for tasks assessed/performed Overall Cognitive Status: Within Functional Limits for tasks assessed    Balance     End of Session PT - End of Session Equipment Utilized During Treatment: Oxygen Activity Tolerance: Treatment limited secondary to medical complications (Comment) Patient left: in chair;with call bell/phone within reach Nurse Communication: Mobility status;Other (comment) (Desat at rest - nursing aware.)        INGOLD,Janae Bonser 04/24/2013, 12:10 PM [facility] [phone] (pager)

## 2013-04-24 NOTE — Progress Notes (Signed)
Went back to speak with RN and patient.  Evidently now patient has received ativan and is much calmer.  After discussion, agreed to go back on 100% NRB and if needs be BiPAP.  He still would like to have a second opinion regarding duration of how long he will need to stay in the hospital.  We addressed the a-fib treatment issue that I am concerned that amiodarone will cause more harm than good.  If HR control become an issue will add diltiazem or verapamil.    Total CC time of 90 min.  Rush Farmer, M.D. [facility] Pulmonary/Critical Care Medicine. Pager: [phone]. After hours pager: [phone].

## 2013-04-24 NOTE — Care Management Note (Signed)
    Page 1 of 2   04/24/2013     2:14:35 PM   CARE MANAGEMENT NOTE 04/24/2013  Patient:  Connor Ford, Connor Ford   Account Number:  [id number]  Date Initiated:  04/16/2013  Documentation initiated by:  [facility]  Subjective/Objective Assessment:   Admitted with pneumonia - deteriorated - requiring intubation.     Action/Plan:   Anticipated DC Date:     Anticipated DC Plan:  [place]  CM consult      PAC Choice  [place]   Choice offered to / List presented to:  C-1 Patient   DME arranged  [place] arranged  HH-2 PT      [place]   Status of service:  Completed, signed off Medicare Important Message given?   (If response is "NO", the following Medicare IM given date fields will be blank) Date Medicare IM given:   Date Additional Medicare IM given:    Discharge Disposition:  [place]  Per UR Regulation:  Reviewed for med. necessity/level of care/duration of stay  If discussed at [place] of Stay Meetings, dates discussed:   04/19/2013  04/24/2013    Comments:  Contact:  Connor Ford,Connor Ford Significant other [place] [phone]  1/27 1412 Connor [name] rn,bsn spoke w pt. phy ther had rec hhpt and rw. pt states he has moved to [place] w friend. he has used [facility] in past and would like them for hhpt. ref to [facility] w [facility]. will await final orders. will need rw for home order. [facility] uses [facility] for eq so will need order to get from [facility].  04-18-13 10:30am Connor Ford, RNBSN [phone] Remains extubated but requiring bipap and venti mask.  CM will continue to follow.  04-16-13 [place] [phone] Extubated today - sitting up in chair - looks good.

## 2013-04-24 NOTE — Progress Notes (Signed)
Nurse at bedside patient upset stating that he gets upset every time the MD comes in the room. Also states that MD is a "quack" and he would like to have another doctor. Patient also concerned that he is not being treated for his atrial fibrillation.and that the MD told him to put the mask on and to leave it on but he can not eat with the venti mask on.Dr. Nelda Marseille paged and made aware of above. MD ok to put patient back on NRB mask. Mask placed on patient. Nurse informed pt that MD has been made aware of patients concerns.

## 2013-04-24 NOTE — Progress Notes (Signed)
ANTICOAGULATION CONSULT NOTE - Follow Up Consult  Pharmacy Consult for Coumadin Indication: atrial fibrillation  No Known Allergies  Patient Measurements: Height: 6\' 4"  (193 cm) Weight: 198 lb 6.6 oz (90 kg) IBW/kg (Calculated) : 86.8  Vital Signs: Temp: 97.5 F (36.4 C) (01/27 0849) Temp src: Oral (01/27 0849) BP: 127/63 mmHg (01/27 0849) Pulse Rate: 105 (01/27 0849)  Labs:  Recent Labs  04/22/13 0410 04/23/13 0150 04/24/13 0353  HGB  --   --  11.8*  HCT  --   --  35.0*  PLT  --   --  653*  LABPROT 20.8* 23.0* 25.4*  INR 1.85* 2.11* 2.40*  CREATININE  --  0.92 0.81    Estimated Creatinine Clearance: 108.6 ml/min (by C-G formula based on Cr of 0.81).  Assessment:  42 yom continues on coumadin for hx afib. INR is therapeutic today at 2.40. CBC remains stable. No bleeding noted. Patient completed Levaquin therapy yesterday. Home regimen for Coumadin was 2.5mg  and 5mg  alternating.    Goal of Therapy:  INR 2-3   Plan:  1. Coumadin 2.5mg  PO x 1 tonight - Likely back to home regimen soon but will monitor O2 status/po status.  2. F/u AM INR  Sloan Leiter, PharmD, BCPS Clinical Pharmacist [phone]  04/24/2013 9:49 AM

## 2013-04-24 NOTE — Care Management Note (Signed)
Received a call from CM to follow patient, and coordinate [place] needs. I will follow, Thank you. Christa See RN BSN  [facility]

## 2013-04-24 NOTE — Progress Notes (Signed)
Called by bedside RN.  Patient evidently became upset after our conversation early.  I had come in the room.  Sat down with him, informed him of his condition that ARDS if very likely here.  He was insistent that he wants to go home Thursday or Friday even if it means leaving AMA.  I advised him against that and after patient persistently pushing for leaving I told him that I can not discharge him because on room air I do not believe he would make it.  He became visibly upset after that and essentially refused to speak to me any further.  I left the room and returned approximately 2 hours later to try and understand what he was upset about and he persistently reiterated I just want to go home.  I believe he is upset simply because he is not hearing what he want to hear which is that he can go home.  I informed him that he will likely need to go to a rehab facility while the inflammation in his lungs quite down and that was when he became very upset.  I saw no further point in staying in the room at that point and wanted to give him sometime to process the information.    I am becoming more and more concerned about some of the statements that the patient is making.  The one concerning me the most is when he said he would rather "put a bullet in his head" than have to wear BiPAP.  I asked him if he really meant that and he stated it was a figure of speech.  I do not believe he would harm himself at this point but I am concerned he is becoming more and more gloomy about his pulmonary condition.   I was called earlier by bedside RN who informed me that patient does not wish to see me any further and I will arrange for him to see one of my partners tomorrow as I believe continuing this relationship will result in nothing but him becoming more and more upset and will speak with my partner to see if they believe that a psychiatric evaluation would be appropriate in this situation.  Rush Farmer, M.D. [facility]  Pulmonary/Critical Care Medicine. Pager: [phone]. After hours pager: [phone].

## 2013-04-24 NOTE — Progress Notes (Signed)
PULMONARY / CRITICAL CARE MEDICINE  Name: Connor Ford MRN: [id number] DOB: [birth year]    ADMISSION DATE:  04/03/2013 CONSULTATION DATE:  04/13/2013  REFERRING MD :  [facility] PRIMARY SERVICE:  PCCM  CHIEF COMPLAINT:  Acute respiratory failure  BRIEF PATIENT DESCRIPTION: 68 y/o w/ PMHx of systolic CHF and sleep apnea admitted on 1/15 with dyspnea, fever, and pulmonary infiltrates. On 1/16, patient developed acute respiratory distress and CXR demonstrated worsening bilateral airspace disease.  PCCM was consulted.  SIGNIFICANT EVENTS / STUDIES:  1/15  Admitted for pneumonia 1/16  Transferred to ICU/Intubated 1/19  Extubated 1/26  Unable to maintain sats on 100% and replaced on BiPAP. 1/26 PM- pt refused BiPAP overnight, cont NRB SpO2- 88-93%  LINES / TUBES: ETT 1/16 >>> 1/19 CVL 1/16 >>>1/22 R. Radial A-line 1/16 >>> 1/20  CULTURES: 1/15 Blood >>>neg 1/15 Urine >>>no growth 1/15 Resp virus panel nasal swab >>> neg 1/17 Resp >>>few candida, negative 1/18 Resp virus panel trach aspir >>> Flu A positive 1/19 Blood >>>neg  ANTIBIOTICS: Ceftriaxone 1/15 >>> 1/16 Azithromycin 1/15 >>> 1/16  Levaquin 1/16 >>> 1/16 Tamiflu 1/16 >>> 1/19 Vancomycin 1/16 >>> 1/21 Zosyn 1/16 >>> 1/20 Rocephin 1/20 >>>1/23 Levaquin 1/23 >>>1/26  Sched Meds: . antiseptic oral rinse  15 mL Mouth Rinse QID  . aspirin  81 mg Oral QHS  . atorvastatin  20 mg Oral q1800  . chlorhexidine  15 mL Mouth Rinse BID  . feeding supplement (ENSURE COMPLETE)  237 mL Oral TID BM  . furosemide  40 mg Oral Daily  . methylPREDNISolone (SOLU-MEDROL) injection  60 mg Intravenous Q8H  . sodium chloride  3 mL Intravenous Q12H  . Warfarin - Pharmacist Dosing Inpatient   Does not apply q1800    INTERVAL HISTORY: Diuresed, barely able to maintain sats overnight on 100% NRB, refusing BiPAP.Marland Kitchen  VITAL SIGNS: Temp:  [97.3 F (36.3 C)-98.1 F (36.7 C)] 97.5 F (36.4 C) (01/27 0849) Pulse Rate:  [81-113] 105 (01/27  0849) Resp:  [17-34] 31 (01/27 0849) BP: (127-140)/(56-90) 127/63 mmHg (01/27 0849) SpO2:  [88 %-98 %] 88 % (01/27 0849)  HEMODYNAMICS:   VENTILATOR SETTINGS:    INTAKE / OUTPUT: Intake/Output     01/26 0701 - 01/27 0700 01/27 0701 - 01/28 0700   P.O.     Total Intake(mL/kg)     Urine (mL/kg/hr) 450 (0.2)    Total Output 450     Net -450           PHYSICAL EXAMINATION: General:  No acute distress. On NRB mask. Neuro:  Awake, alert. Moves all limbs spontaneously. Follows commands. HEENT: PERRL. Cardiovascular: Rate controlled, rhythm irregularly irregular. 2/6 systolic murmur.  Lungs: Bibasilar crackles otherwise clear  Abdomen:  Soft, nontender, bowel sounds present.  Musculoskeletal: No edema. Chronic venous stasis changes in LE's. Skin: Intact.   LABS:  CBC  Recent Labs Lab 04/19/13 0438 04/21/13 0400 04/24/13 0353  WBC 12.0* 12.4* 21.0*  HGB 11.5* 11.7* 11.8*  HCT 35.0* 34.6* 35.0*  PLT 437* 605* 653*   Coag's  Recent Labs Lab 04/22/13 0410 04/23/13 0150 04/24/13 0353  INR 1.85* 2.11* 2.40*   BMET  Recent Labs Lab 04/21/13 0400 04/23/13 0150 04/24/13 0353  NA 139 138 138  K 3.8 4.3 3.7  CL 100 99 97  CO2 28 30 28   BUN 22 17 26*  CREATININE 0.83 0.92 0.81  GLUCOSE 106* 108* 141*   Electrolytes  Recent Labs Lab 04/21/13 0400 04/23/13 0150 04/24/13 0353  CALCIUM 7.8* 7.8* 8.1*  MG  --   --  2.1  PHOS  --   --  4.5    Sepsis Markers No results found for this basename: LATICACIDVEN, PROCALCITON, O2SATVEN,  in the last 168 hours ABG No results found for this basename: PHART, PCO2ART, PO2ART,  in the last 168 hours Liver Enzymes  Recent Labs Lab 04/18/13 0500 04/21/13 0400 04/23/13 0150  AST 64* 144* 183*  ALT 37 92* 129*  ALKPHOS 66 100 115  BILITOT 0.9 0.8 0.7  ALBUMIN 1.8* 1.7* 1.7*   Cardiac Enzymes  Recent Labs Lab 04/19/13 1151  PROBNP 3144.0*   Glucose  Recent Labs Lab 04/19/13 1913 04/20/13 0014  04/20/13 0402 04/20/13 0743 04/20/13 1145 04/20/13 1539  GLUCAP 134* 106* 102* 105* 124* 152*   CXR:  1/26- no significant changes in bilateral parenchymal changes   ASSESSMENT / PLAN:  PULMONARY A:   Acute hypoxemic respiratory failure. Likely CAP 2/2 Flu A infection.  P:   - Place back on BiPAP. - Xopenex prn as ordered. - Albuterol neb q6h. - Continue steroids started on 1/26. - CXR in AM. - Monitor O2 needs, pt frequently removing mask to eat and talk still maintainign sats mid 80's and higher,  trial venti mask as able.  CARDIOVASCULAR A:  H/o CHF. ECHO shows normal EF.  Mod mitral stenosis, pulm HTN 11mmHg PAP AF. Rate controlled. On Coumadin at home. P:  - Metoprolol prn to maintain HR <115. - ASA. - Lipitor. - Hold ACEI in setting of AKI. - Coumadin per pharmacy.  RENAL A:   AKI. Resolved Hypernatremia. Resolved.  P:   - Hold ACEI. - Trend BMP. - PO lasix as ordered.  GASTROINTESTINAL A:   Elevated transaminases & low Alb Hepatitis panel => neg P:   - Diet regular as able. - Check lfts in AM.  HEMATOLOGIC A:   Mild anemia. VTE Px. Leukocytosis (All abx completed) P:  - Trend CBC. - Coumadin.  INFECTIOUS A:   CAP 2/2 Flu A infection.  P:   - Completed all abx. - Leukocytosis, trend fever curve, will hold of abx for now unless sputum production increases and become purulent or fever starts.  ENDOCRINE  A:   Normoglycemia.   P:   - Monitor glucose on BMP.  NEUROLOGIC A:   Hx ETOH P:   - Ativan prn PO for anxiety.  Demetrios Loll PA-S 04/24/2013 0915  TODAY'S SUMMARY:  Very concerned with increased O2 demand NRB/BiPAP continuously to maintain sats.  Will continue to diureses (added two doses of lasix today IV) and steroids with the hope to quiet down inflammation enough to help with O2 demand.  CC time 35 min.  Patient seen and examined, agree with above note.  I dictated the care and orders written for this patient under my  direction.  Rush Farmer, MD [phone]

## 2013-04-25 DIAGNOSIS — R0902 Hypoxemia: Secondary | ICD-10-CM

## 2013-04-25 DIAGNOSIS — D72829 Elevated white blood cell count, unspecified: Secondary | ICD-10-CM

## 2013-04-25 LAB — COMPREHENSIVE METABOLIC PANEL
ALBUMIN: 1.8 g/dL — AB (ref 3.5–5.2)
ALT: 184 U/L — ABNORMAL HIGH (ref 0–53)
AST: 218 U/L — ABNORMAL HIGH (ref 0–37)
Alkaline Phosphatase: 119 U/L — ABNORMAL HIGH (ref 39–117)
BILIRUBIN TOTAL: 0.5 mg/dL (ref 0.3–1.2)
BUN: 34 mg/dL — AB (ref 6–23)
CALCIUM: 7.9 mg/dL — AB (ref 8.4–10.5)
CHLORIDE: 101 meq/L (ref 96–112)
CO2: 28 mEq/L (ref 19–32)
CREATININE: 0.95 mg/dL (ref 0.50–1.35)
GFR calc Af Amer: >90 mL/min (ref >90)
GFR calc non Af Amer: 84 mL/min — ABNORMAL LOW (ref >90)
Glucose, Bld: 186 mg/dL — ABNORMAL HIGH (ref 70–99)
Potassium: 3.8 mEq/L (ref 3.7–5.3)
Sodium: 142 mEq/L (ref 137–147)
Total Protein: 6 g/dL (ref 6.0–8.3)

## 2013-04-25 LAB — CBC
HCT: 34.8 % — ABNORMAL LOW (ref 39.0–52.0)
HEMOGLOBIN: 11.6 g/dL — AB (ref 13.0–17.0)
MCH: 31.3 pg (ref 26.0–34.0)
MCHC: 33.3 g/dL (ref 30.0–36.0)
MCV: 93.8 fL (ref 78.0–100.0)
PLATELETS: 654 10*3/uL — AB (ref 150–400)
RBC: 3.71 MIL/uL — AB (ref 4.22–5.81)
RDW: 14.3 % (ref 11.5–15.5)
WBC: 26 10*3/uL — AB (ref 4.0–10.5)

## 2013-04-25 LAB — PROTIME-INR
INR: 2.91 — AB (ref 0.00–1.49)
PROTHROMBIN TIME: 29.4 s — AB (ref 11.6–15.2)

## 2013-04-25 MED ORDER — WARFARIN SODIUM 2.5 MG PO TABS
2.5000 mg | ORAL_TABLET | Freq: Once | ORAL | Status: AC
  Administered 2013-04-25: 2.5 mg via ORAL
  Filled 2013-04-25: qty 1

## 2013-04-25 MED ORDER — FUROSEMIDE 10 MG/ML IJ SOLN
40.0000 mg | Freq: Once | INTRAMUSCULAR | Status: AC
  Administered 2013-04-25: 40 mg via INTRAVENOUS
  Filled 2013-04-25: qty 4

## 2013-04-25 MED ORDER — POTASSIUM CHLORIDE CRYS ER 20 MEQ PO TBCR
40.0000 meq | EXTENDED_RELEASE_TABLET | Freq: Three times a day (TID) | ORAL | Status: AC
  Administered 2013-04-25 (×2): 40 meq via ORAL
  Filled 2013-04-25 (×2): qty 2

## 2013-04-25 MED ORDER — BOOST / RESOURCE BREEZE PO LIQD
1.0000 | Freq: Two times a day (BID) | ORAL | Status: DC
  Administered 2013-04-27 – 2013-04-28 (×4): 1 via ORAL
  Filled 2013-04-25: qty 1

## 2013-04-25 NOTE — Progress Notes (Signed)
PULMONARY / CRITICAL CARE MEDICINE  Name: Connor Ford MRN: [id number] DOB: 06-24-1945    ADMISSION DATE:  04/26/2013 CONSULTATION DATE:  04/13/2013  REFERRING MD :  [facility] PRIMARY SERVICE:  PCCM  CHIEF COMPLAINT:  Acute respiratory failure  BRIEF PATIENT DESCRIPTION: 68 y/o w/ PMHx of systolic CHF and sleep apnea admitted on 1/15 with dyspnea, fever, and pulmonary infiltrates. On 1/16, patient developed acute respiratory distress and CXR demonstrated worsening bilateral airspace disease.  PCCM was consulted.  SIGNIFICANT EVENTS / STUDIES:  1/15  Admitted for pneumonia 1/16  Transferred to ICU/Intubated 1/19  Extubated 1/26  Unable to maintain sats on 100% and replaced on BiPAP. 1/26 PM- pt refused BiPAP overnight, cont NRB SpO2- 88-93%  LINES / TUBES: ETT 1/16 >>> 1/19 CVL 1/16 >>>1/22 R. Radial A-line 1/16 >>> 1/20  CULTURES: 1/15 Blood >>>neg 1/15 Urine >>>no growth 1/15 Resp virus panel nasal swab >>> neg 1/17 Resp >>>few candida, negative 1/18 Resp virus panel trach aspir >>> Flu A positive 1/19 Blood >>>neg  ANTIBIOTICS: Ceftriaxone 1/15 >>> 1/16 Azithromycin 1/15 >>> 1/16  Levaquin 1/16 >>> 1/16 Tamiflu 1/16 >>> 1/19 Vancomycin 1/16 >>> 1/21 Zosyn 1/16 >>> 1/20 Rocephin 1/20 >>>1/23 Levaquin 1/23 >>>1/26  Sched Meds: . antiseptic oral rinse  15 mL Mouth Rinse QID  . aspirin  81 mg Oral QHS  . atorvastatin  20 mg Oral q1800  . chlorhexidine  15 mL Mouth Rinse BID  . feeding supplement (RESOURCE BREEZE)  1 Container Oral BID BM  . furosemide  40 mg Oral Daily  . methylPREDNISolone (SOLU-MEDROL) injection  60 mg Intravenous Q8H  . sodium chloride  3 mL Intravenous Q12H  . warfarin  2.5 mg Oral ONCE-1800  . Warfarin - Pharmacist Dosing Inpatient   Does not apply q1800    INTERVAL HISTORY: Diuresed, barely able to maintain sats overnight on 100% NRB, refusing BiPAP.Marland Kitchen  VITAL SIGNS: Temp:  [97.2 F (36.2 C)-97.5 F (36.4 C)] 97.4 F (36.3 C) (01/28  1253) Pulse Rate:  [74-109] 97 (01/28 1253) Resp:  [15-27] 15 (01/28 1253) BP: (123-152)/(50-74) 129/50 mmHg (01/28 1253) SpO2:  [88 %-98 %] 94 % (01/28 1253) FiO2 (%):  [100 %] 100 % (01/28 1253)  HEMODYNAMICS:   VENTILATOR SETTINGS: Vent Mode:  [-]  FiO2 (%):  [100 %] 100 %  INTAKE / OUTPUT: Intake/Output     01/27 0701 - 01/28 0700 01/28 0701 - 01/29 0700   Urine (mL/kg/hr) 2150 (1)    Total Output 2150     Net -2150           PHYSICAL EXAMINATION: General:  No acute distress. On NRB mask. Neuro:  Awake, alert. Moves all limbs spontaneously. Follows commands. HEENT: PERRL. Cardiovascular: Rate controlled, rhythm irregularly irregular. 2/6 systolic murmur.  Lungs: Bibasilar crackles otherwise clear  Abdomen:  Soft, nontender, bowel sounds present.  Musculoskeletal: No edema. Chronic venous stasis changes in LE's. Skin: Intact.   LABS:  CBC  Recent Labs Lab 04/21/13 0400 04/24/13 0353 04/25/13 0424  WBC 12.4* 21.0* 26.0*  HGB 11.7* 11.8* 11.6*  HCT 34.6* 35.0* 34.8*  PLT 605* 653* 654*   Coag's  Recent Labs Lab 04/23/13 0150 04/24/13 0353 04/25/13 0424  INR 2.11* 2.40* 2.91*   BMET  Recent Labs Lab 04/23/13 0150 04/24/13 0353 04/25/13 0424  NA 138 138 142  K 4.3 3.7 3.8  CL 99 97 101  CO2 30 28 28   BUN 17 26* 34*  CREATININE 0.92 0.81 0.95  GLUCOSE  108* 141* 186*   Electrolytes  Recent Labs Lab 04/23/13 0150 04/24/13 0353 04/25/13 0424  CALCIUM 7.8* 8.1* 7.9*  MG  --  2.1  --   PHOS  --  4.5  --     Sepsis Markers No results found for this basename: LATICACIDVEN, PROCALCITON, O2SATVEN,  in the last 168 hours ABG No results found for this basename: PHART, PCO2ART, PO2ART,  in the last 168 hours Liver Enzymes  Recent Labs Lab 04/21/13 0400 04/23/13 0150 04/25/13 0424  AST 144* 183* 218*  ALT 92* 129* 184*  ALKPHOS 100 115 119*  BILITOT 0.8 0.7 0.5  ALBUMIN 1.7* 1.7* 1.8*   Cardiac Enzymes  Recent Labs Lab  04/19/13 1151  PROBNP 3144.0*   Glucose  Recent Labs Lab 04/19/13 1913 04/20/13 0014 04/20/13 0402 04/20/13 0743 04/20/13 1145 04/20/13 1539  GLUCAP 134* 106* 102* 105* 124* 152*   CXR:  1/26- no significant changes in bilateral parenchymal changes   ASSESSMENT / PLAN:  PULMONARY A:   Acute hypoxemic respiratory failure. Likely CAP 2/2 Flu A infection.  P:   - Change BiPAP to PRN. - Xopenex prn as ordered. - Albuterol neb q6h. - Continue steroids started on 1/26. - CXR in AM. - Diureses as below. - If diureses well and improves then will order a non-contrast CT of the chest in AM.  CARDIOVASCULAR A:  H/o CHF. ECHO shows normal EF.  Mod mitral stenosis, pulm HTN 30mmHg PAP AF. Rate controlled. On Coumadin at home. P:  - Metoprolol prn to maintain HR <115. - ASA. - Lipitor. - Hold ACEI in setting of AKI. - Coumadin per pharmacy.  RENAL A:   AKI. Resolved Hypernatremia. Resolved.  P:   - Hold ACEI. - Trend BMP. - Lasix 40 IV x1 as ordered. - K replacement.  GASTROINTESTINAL A:   Elevated transaminases & low Alb Hepatitis panel => neg P:   - Diet regular as able. - Check lfts in AM.  HEMATOLOGIC A:   Mild anemia. VTE Px. Leukocytosis (All abx completed) P:  - Trend CBC. - Coumadin.  INFECTIOUS A:   CAP 2/2 Flu A infection.  P:   - Completed all abx. - Leukocytosis, trend fever curve, will hold of abx for now unless sputum production increases and become purulent or fever starts.  ENDOCRINE  A:   Normoglycemia.   P:   - Monitor glucose on BMP.  NEUROLOGIC A:   Hx ETOH P:   - Ativan prn PO for anxiety.  TODAY'S SUMMARY:  Continue steroids and diureses, no abx for now.  Rush Farmer, M.D. [facility] Pulmonary/Critical Care Medicine. Pager: [phone]. After hours pager: [phone].

## 2013-04-25 NOTE — Progress Notes (Signed)
NUTRITION FOLLOW UP  Intervention:    Discontinue Ensure Complete  Resource Breeze po BID, each supplement provides 250 kcal and 9 grams of protein RD to follow for nutrition care plan  Nutrition Dx:   Inadequate oral intake related to difficulty breathing and poor appetite as evidenced by poor intake of meals, improved  Goal:   Pt to meet >/= 90% of their estimated nutrition needs, progressing  Monitor:   PO & supplemental intake, weight, labs, I/O's  Assessment:   Pt with complex medical history including atrial fibrillation on Coumadin and amiodarone, digoxin and Diltiazem, HTN, HLD, systolic CHF, presented with main concern of several days duration of progressively worsening shortness of breath that initially started with exertion and now present at rest. Pt explains he has seen his PCP and was diagnosed with PNA, and was started on Levaquin but his symptoms did not improve.   Patient transferred to 2C-Stepdown from 35M-Medical ICU 1/23.  Patient on non-rebreather mask upon RD visit.  Patient pleasant.  Reports his appetite has finally picked up.  PO intake variable at 50-75% per flowsheet records.  Patient requesting Resource Breeze instead of Ensure Complete (as he likes it better).  RD to change supplements orders.  Height: Ht Readings from Last 1 Encounters:  04/20/13 6\' 4"  (1.93 m)    Weight Status:   Wt Readings from Last 1 Encounters:  04/22/13 198 lb 6.6 oz (90 kg)    Body mass index is 24.16 kg/(m^2).  Re-estimated needs:  Kcal: 2100-2300 Protein: 110-120 gm Fluid: 2.1-2.3 L  Skin: Intact  Diet Order: General   Intake/Output Summary (Last 24 hours) at 04/25/13 1143 Last data filed at 04/25/13 0000  Gross per 24 hour  Intake      0 ml  Output   1950 ml  Net  -1950 ml    Labs:   Recent Labs Lab 04/23/13 0150 04/24/13 0353 04/25/13 0424  NA 138 138 142  K 4.3 3.7 3.8  CL 99 97 101  CO2 30 28 28   BUN 17 26* 34*  CREATININE 0.92 0.81 0.95   CALCIUM 7.8* 8.1* 7.9*  MG  --  2.1  --   PHOS  --  4.5  --   GLUCOSE 108* 141* 186*    Scheduled Meds: . antiseptic oral rinse  15 mL Mouth Rinse QID  . aspirin  81 mg Oral QHS  . atorvastatin  20 mg Oral q1800  . chlorhexidine  15 mL Mouth Rinse BID  . feeding supplement (ENSURE COMPLETE)  237 mL Oral TID BM  . furosemide  40 mg Oral Daily  . methylPREDNISolone (SOLU-MEDROL) injection  60 mg Intravenous Q8H  . sodium chloride  3 mL Intravenous Q12H  . warfarin  2.5 mg Oral ONCE-1800  . Warfarin - Pharmacist Dosing Inpatient   Does not apply q1800    Continuous Infusions:   Arthur Holms, RD, LDN Pager #: [phone] After-Hours Pager #: [phone]

## 2013-04-25 NOTE — Progress Notes (Signed)
ANTICOAGULATION CONSULT NOTE - Follow Up Consult  Pharmacy Consult for Coumadin Indication: atrial fibrillation  No Known Allergies  Patient Measurements: Height: 6\' 4"  (193 cm) Weight: 198 lb 6.6 oz (90 kg) IBW/kg (Calculated) : 86.8  Vital Signs: Temp: 97.2 F (36.2 C) (01/28 0838) Temp src: Axillary (01/28 0838) BP: 123/57 mmHg (01/28 0838) Pulse Rate: 109 (01/28 0838)  Labs:  Recent Labs  04/23/13 0150 04/24/13 0353 04/25/13 0424  HGB  --  11.8* 11.6*  HCT  --  35.0* 34.8*  PLT  --  653* 654*  LABPROT 23.0* 25.4* 29.4*  INR 2.11* 2.40* 2.91*  CREATININE 0.92 0.81 0.95    Estimated Creatinine Clearance: 92.6 ml/min (by C-G formula based on Cr of 0.95).  Assessment:  56 yom continues on coumadin for hx afib. INR is therapeutic today at 2.91 and trend up noted. CBC remains stable. No bleeding noted. Patient has completed Levaquin therapy. Home regimen for Coumadin was 2.5mg  and 5mg  alternating.    Goal of Therapy:  INR 2-3   Plan:  1. Coumadin 2.5mg  PO x 1 tonight 2. F/u AM INR  Hildred Laser, Pharm D 04/25/2013 8:53 AM

## 2013-04-26 ENCOUNTER — Inpatient Hospital Stay (HOSPITAL_COMMUNITY): Payer: Medicare PPO

## 2013-04-26 ENCOUNTER — Encounter (HOSPITAL_COMMUNITY): Payer: Self-pay | Admitting: Radiology

## 2013-04-26 LAB — BASIC METABOLIC PANEL
BUN: 36 mg/dL — AB (ref 6–23)
CALCIUM: 7.8 mg/dL — AB (ref 8.4–10.5)
CO2: 27 mEq/L (ref 19–32)
Chloride: 101 mEq/L (ref 96–112)
Creatinine, Ser: 0.86 mg/dL (ref 0.50–1.35)
GFR calc non Af Amer: 88 mL/min — ABNORMAL LOW (ref >90)
Glucose, Bld: 152 mg/dL — ABNORMAL HIGH (ref 70–99)
POTASSIUM: 5 meq/L (ref 3.7–5.3)
Sodium: 139 mEq/L (ref 137–147)

## 2013-04-26 LAB — CBC
HCT: 34.7 % — ABNORMAL LOW (ref 39.0–52.0)
HEMOGLOBIN: 11.5 g/dL — AB (ref 13.0–17.0)
MCH: 31.2 pg (ref 26.0–34.0)
MCHC: 33.1 g/dL (ref 30.0–36.0)
MCV: 94 fL (ref 78.0–100.0)
PLATELETS: 607 10*3/uL — AB (ref 150–400)
RBC: 3.69 MIL/uL — AB (ref 4.22–5.81)
RDW: 14.3 % (ref 11.5–15.5)
WBC: 23.6 10*3/uL — ABNORMAL HIGH (ref 4.0–10.5)

## 2013-04-26 LAB — PROTIME-INR
INR: 3.07 — ABNORMAL HIGH (ref 0.00–1.49)
PROTHROMBIN TIME: 30.6 s — AB (ref 11.6–15.2)

## 2013-04-26 LAB — PHOSPHORUS: Phosphorus: 3.6 mg/dL (ref 2.3–4.6)

## 2013-04-26 LAB — MAGNESIUM: Magnesium: 2.1 mg/dL (ref 1.5–2.5)

## 2013-04-26 MED ORDER — LORAZEPAM 2 MG/ML IJ SOLN
1.0000 mg | INTRAMUSCULAR | Status: DC | PRN
  Administered 2013-04-26 – 2013-04-29 (×10): 1 mg via INTRAVENOUS
  Filled 2013-04-26 (×10): qty 1

## 2013-04-26 MED ORDER — LORAZEPAM 2 MG/ML IJ SOLN
INTRAMUSCULAR | Status: AC
Start: 2013-04-26 — End: 2013-04-26
  Filled 2013-04-26: qty 1

## 2013-04-26 MED ORDER — METHYLPREDNISOLONE SODIUM SUCC 125 MG IJ SOLR
125.0000 mg | Freq: Four times a day (QID) | INTRAMUSCULAR | Status: DC
  Administered 2013-04-26 – 2013-04-30 (×14): 125 mg via INTRAVENOUS
  Filled 2013-04-26 (×19): qty 2

## 2013-04-26 MED ORDER — LORAZEPAM 2 MG/ML IJ SOLN
1.0000 mg | INTRAMUSCULAR | Status: DC
  Administered 2013-04-26: 1 mg via INTRAVENOUS

## 2013-04-26 MED ORDER — FUROSEMIDE 10 MG/ML IJ SOLN
40.0000 mg | Freq: Once | INTRAMUSCULAR | Status: AC
  Administered 2013-04-26: 40 mg via INTRAVENOUS
  Filled 2013-04-26: qty 4

## 2013-04-26 MED ORDER — WARFARIN 1.25 MG HALF TABLET
1.2500 mg | ORAL_TABLET | Freq: Once | ORAL | Status: AC
  Administered 2013-04-26: 1.25 mg via ORAL
  Filled 2013-04-26: qty 1

## 2013-04-26 NOTE — Progress Notes (Signed)
Pt wanting bipap mask off this morning and put on NRB mask by this nurse. 02 sats trended up to 83-85% on NRB, RT called. Pt refusing to back on bipap. Sats then trended down to 80%. Given PRN Ativan, talked to Dr. Nelda Marseille, and pt stating he will go back on bipap at 7am.

## 2013-04-26 NOTE — Progress Notes (Addendum)
PULMONARY / CRITICAL CARE MEDICINE  Name: Connor Ford MRN: [id number] DOB: [birth year]    ADMISSION DATE:  2013/04/27 CONSULTATION DATE:  04/13/2013  REFERRING MD :  [facility] PRIMARY SERVICE:  PCCM  CHIEF COMPLAINT:  Acute respiratory failure  BRIEF PATIENT DESCRIPTION: 68 y/o w/ PMHx of systolic CHF and sleep apnea admitted on 1/15 with dyspnea, fever, and pulmonary infiltrates. On 1/16, patient developed acute respiratory distress and CXR demonstrated worsening bilateral airspace disease.  PCCM was consulted.  SIGNIFICANT EVENTS / STUDIES:  1/15  Admitted for pneumonia 1/16  Transferred to ICU/Intubated 1/19  Extubated 1/26  Unable to maintain sats on 100% and replaced on BiPAP. 1/26 PM- pt refused BiPAP overnight, cont NRB SpO2- 88-93%  LINES / TUBES: ETT 1/16 >>> 1/19 CVL 1/16 >>>1/22 R. Radial A-line 1/16 >>> 1/20  CULTURES: 1/15 Blood >>>neg 1/15 Urine >>>no growth 1/15 Resp virus panel nasal swab >>> neg 1/17 Resp >>>few candida, negative 1/18 Resp virus panel trach aspir >>> Flu A positive 1/19 Blood >>>neg  ANTIBIOTICS: Ceftriaxone 1/15 >>> 1/16 Azithromycin 1/15 >>> 1/16  Levaquin 1/16 >>> 1/16 Tamiflu 1/16 >>> 1/19 Vancomycin 1/16 >>> 1/21 Zosyn 1/16 >>> 1/20 Rocephin 1/20 >>>1/23 Levaquin 1/23 >>>1/26  Sched Meds: . antiseptic oral rinse  15 mL Mouth Rinse QID  . aspirin  81 mg Oral QHS  . atorvastatin  20 mg Oral q1800  . chlorhexidine  15 mL Mouth Rinse BID  . feeding supplement (RESOURCE BREEZE)  1 Container Oral BID BM  . furosemide  40 mg Oral Daily  . LORazepam      . methylPREDNISolone (SOLU-MEDROL) injection  60 mg Intravenous Q8H  . sodium chloride  3 mL Intravenous Q12H  . warfarin  1.25 mg Oral ONCE-1800  . Warfarin - Pharmacist Dosing Inpatient   Does not apply q1800    INTERVAL HISTORY: Diuresed, desaturation this AM on 100% NRB down to 70's.  VITAL SIGNS: Temp:  [97.4 F (36.3 C)-97.7 F (36.5 C)] 97.7 F (36.5 C) (01/29  0800) Pulse Rate:  [73-106] 98 (01/29 0800) Resp:  [15-35] 28 (01/29 0800) BP: (122-156)/(46-106) 129/71 mmHg (01/29 0800) SpO2:  [85 %-97 %] 90 % (01/29 0800) FiO2 (%):  [100 %] 100 % (01/29 0800) Weight:  [90 kg (198 lb 6.6 oz)] 90 kg (198 lb 6.6 oz) (01/29 0424)  HEMODYNAMICS:   VENTILATOR SETTINGS: Vent Mode:  [-]  FiO2 (%):  [100 %] 100 %  INTAKE / OUTPUT: Intake/Output     01/28 0701 - 01/29 0700 01/29 0701 - 01/30 0700   I.V. (mL/kg) 3 (0)    Total Intake(mL/kg) 3 (0)    Urine (mL/kg/hr) 1450 (0.7)    Total Output 1450     Net -1447           PHYSICAL EXAMINATION: General:  No acute distress. On BiPAP. Neuro:  Awake, alert. Moves all limbs spontaneously. Follows commands. HEENT: PERRL. Cardiovascular: Rate controlled, rhythm irregularly irregular. 2/6 systolic murmur.  Lungs: Bibasilar crackles otherwise clear  Abdomen:  Soft, nontender, bowel sounds present.  Musculoskeletal: No edema. Chronic venous stasis changes in LE's. Skin: Intact.   LABS:  CBC  Recent Labs Lab 04/24/13 0353 04/25/13 0424 04/26/13 0329  WBC 21.0* 26.0* 23.6*  HGB 11.8* 11.6* 11.5*  HCT 35.0* 34.8* 34.7*  PLT 653* 654* 607*   Coag's  Recent Labs Lab 04/24/13 0353 04/25/13 0424 04/26/13 0329  INR 2.40* 2.91* 3.07*   BMET  Recent Labs Lab 04/24/13 0353 04/25/13 0424  04/26/13 0329  NA 138 142 139  K 3.7 3.8 5.0  CL 97 101 101  CO2 28 28 27   BUN 26* 34* 36*  CREATININE 0.81 0.95 0.86  GLUCOSE 141* 186* 152*   Electrolytes  Recent Labs Lab 04/24/13 0353 04/25/13 0424 04/26/13 0329  CALCIUM 8.1* 7.9* 7.8*  MG 2.1  --  2.1  PHOS 4.5  --  3.6    Sepsis Markers No results found for this basename: LATICACIDVEN, PROCALCITON, O2SATVEN,  in the last 168 hours ABG No results found for this basename: PHART, PCO2ART, PO2ART,  in the last 168 hours Liver Enzymes  Recent Labs Lab 04/21/13 0400 04/23/13 0150 04/25/13 0424  AST 144* 183* 218*  ALT 92* 129* 184*   ALKPHOS 100 115 119*  BILITOT 0.8 0.7 0.5  ALBUMIN 1.7* 1.7* 1.8*   Cardiac Enzymes  Recent Labs Lab 04/19/13 1151  PROBNP 3144.0*   Glucose  Recent Labs Lab 04/19/13 1913 04/20/13 0014 04/20/13 0402 04/20/13 0743 04/20/13 1145 04/20/13 1539  GLUCAP 134* 106* 102* 105* 124* 152*   CXR:  1/26- no significant changes in bilateral parenchymal changes   ASSESSMENT / PLAN:  PULMONARY A:   Acute hypoxemic respiratory failure. Likely CAP 2/2 Flu A infection.  Desaturating more this AM. P:   - Place back on BiPAP. - Xopenex prn as ordered. - Albuterol neb q6h. - Continue steroids started on 1/26. - CXR in AM. - Diureses as below. - CT of the chest without contrast today with high resolution cuts.  CARDIOVASCULAR A:  H/o CHF. ECHO shows normal EF.  Mod mitral stenosis, pulm HTN 70mmHg PAP AF. Rate controlled. On Coumadin at home. P:  - Metoprolol prn to maintain HR <115. - ASA. - Lipitor. - Hold ACEI in setting of AKI. - Coumadin per pharmacy. - 2D echo with bubble study, ?shunt.  RENAL A:   AKI. Resolved Hypernatremia. Resolved.  P:   - Hold ACEI. - Trend BMP. - Lasix 40 IV x1 as ordered.  GASTROINTESTINAL A:   Elevated transaminases & low Alb Hepatitis panel => neg P:   - Diet regular as able. - Check lfts in AM.  HEMATOLOGIC A:   Mild anemia. VTE Px. Leukocytosis (All abx completed) trending down, on steroids. P:  - Trend CBC. - Coumadin.  INFECTIOUS A:   CAP 2/2 Flu A infection.  P:   - Completed all abx. - Leukocytosis, trend fever curve, will hold of abx for now unless sputum production increases and become purulent or fever starts.  ENDOCRINE  A:   Normoglycemia.   P:   - Monitor glucose on BMP.  NEUROLOGIC A:   Hx ETOH P:   - Ativan prn PO for anxiety, increase to 1 mg q4 hrs.  TODAY'S SUMMARY:  Continue steroids and diureses, echo with bubble study to evaluate for a shunt and chest CT without contrast with HRCT cuts  today.  CC time 35 minutes.  Rush Farmer, M.D. [facility] Pulmonary/Critical Care Medicine. Pager: [phone]. After hours pager: [phone].

## 2013-04-26 NOTE — Progress Notes (Signed)
PT Cancellation Note  Patient Details Name: MARISSA LOWREY MRN: [id number] DOB: 05/20/1945   Cancelled Treatment:    Reason Eval/Treat Not Completed: Medical issues which prohibited therapy.  Pt having difficulty breathing per nursing.  Will HOLD therapy today.  Thanks.   INGOLD,Marceil Welp 04/26/2013, 3:15 PM  [facility] [phone] (pager)

## 2013-04-26 NOTE — Progress Notes (Signed)
Ct called for patient to have CT scan done patient just off bipap and nurse assess O2 sats. Will call after assessing patient

## 2013-04-26 NOTE — Progress Notes (Signed)
ANTICOAGULATION CONSULT NOTE - Follow Up Consult  Pharmacy Consult for Coumadin Indication: atrial fibrillation  No Known Allergies  Patient Measurements: Height: 6\' 4"  (193 cm) Weight: 198 lb 6.6 oz (90 kg) IBW/kg (Calculated) : 86.8  Vital Signs: Temp: 97.7 F (36.5 C) (01/29 0800) Temp src: Axillary (01/29 0800) BP: 129/71 mmHg (01/29 0800) Pulse Rate: 98 (01/29 0800)  Labs:  Recent Labs  04/24/13 0353 04/25/13 0424 04/26/13 0329  HGB 11.8* 11.6* 11.5*  HCT 35.0* 34.8* 34.7*  PLT 653* 654* 607*  LABPROT 25.4* 29.4* 30.6*  INR 2.40* 2.91* 3.07*  CREATININE 0.81 0.95 0.86    Estimated Creatinine Clearance: 102.3 ml/min (by C-G formula based on Cr of 0.86).  Assessment:  57 yom continues on coumadin for hx afib. INR is slightly above goal at 3.07 and trend up noted. WBC elevated but otherwise CBC remains stable. No bleeding noted. Patient has completed Levaquin therapy. Home regimen for Coumadin was 2.5mg  and 5mg  alternating.    Goal of Therapy:  INR 2-3   Plan:  1. Coumadin 1.25mg  PO x 1 tonight 2. F/u AM INR  Hildred Laser, Pharm D 04/26/2013 8:30 AM

## 2013-04-26 NOTE — Progress Notes (Signed)
Pt placed on 100% NRB per Dr. Nelda Marseille.  Desat noted of SpO2 to 81% on 100% NRB within one minute.  Pt continued with SpO2 in the 80% range.  I told Mr. Parenteau that it is not safe for me to leave him off of his Bi-PAP.  Pt threw his NRB off and RT with Festus Holts, RN assistance placed pt back on Bi-PAP.  Dr. Nelda Marseille paged and aware of this.

## 2013-04-26 NOTE — Progress Notes (Signed)
CT called and will be sending transporter for pt in 1 hour. Will make oncoming  Nurse aware

## 2013-04-26 NOTE — Progress Notes (Signed)
Discussed with the patient a need for CT scan. patient is agreeable to go after he gets his dinner will make CT aware.

## 2013-04-27 ENCOUNTER — Inpatient Hospital Stay (HOSPITAL_COMMUNITY): Payer: Medicare PPO

## 2013-04-27 DIAGNOSIS — Q2111 Secundum atrial septal defect: Secondary | ICD-10-CM

## 2013-04-27 DIAGNOSIS — Q211 Atrial septal defect: Secondary | ICD-10-CM

## 2013-04-27 LAB — HEPATIC FUNCTION PANEL
ALK PHOS: 185 U/L — AB (ref 39–117)
ALT: 250 U/L — AB (ref 0–53)
AST: 201 U/L — AB (ref 0–37)
Albumin: 2.1 g/dL — ABNORMAL LOW (ref 3.5–5.2)
BILIRUBIN INDIRECT: 0.6 mg/dL (ref 0.3–0.9)
Bilirubin, Direct: 0.3 mg/dL (ref 0.0–0.3)
Total Bilirubin: 0.9 mg/dL (ref 0.3–1.2)
Total Protein: 6.4 g/dL (ref 6.0–8.3)

## 2013-04-27 LAB — BASIC METABOLIC PANEL
BUN: 34 mg/dL — AB (ref 6–23)
CALCIUM: 8 mg/dL — AB (ref 8.4–10.5)
CO2: 29 meq/L (ref 19–32)
Chloride: 99 mEq/L (ref 96–112)
Creatinine, Ser: 0.82 mg/dL (ref 0.50–1.35)
GFR calc Af Amer: >90 mL/min (ref >90)
GFR, EST NON AFRICAN AMERICAN: 89 mL/min — AB (ref >90)
GLUCOSE: 182 mg/dL — AB (ref 70–99)
Potassium: 4.3 mEq/L (ref 3.7–5.3)
Sodium: 141 mEq/L (ref 137–147)

## 2013-04-27 LAB — CBC
HCT: 38 % — ABNORMAL LOW (ref 39.0–52.0)
HEMOGLOBIN: 12.7 g/dL — AB (ref 13.0–17.0)
MCH: 31.4 pg (ref 26.0–34.0)
MCHC: 33.4 g/dL (ref 30.0–36.0)
MCV: 93.8 fL (ref 78.0–100.0)
Platelets: 625 10*3/uL — ABNORMAL HIGH (ref 150–400)
RBC: 4.05 MIL/uL — ABNORMAL LOW (ref 4.22–5.81)
RDW: 14.5 % (ref 11.5–15.5)
WBC: 22.3 10*3/uL — ABNORMAL HIGH (ref 4.0–10.5)

## 2013-04-27 LAB — PHOSPHORUS: PHOSPHORUS: 3.9 mg/dL (ref 2.3–4.6)

## 2013-04-27 LAB — MAGNESIUM: Magnesium: 2.2 mg/dL (ref 1.5–2.5)

## 2013-04-27 LAB — PROTIME-INR
INR: 3.55 — ABNORMAL HIGH (ref 0.00–1.49)
PROTHROMBIN TIME: 34.2 s — AB (ref 11.6–15.2)

## 2013-04-27 MED ORDER — FUROSEMIDE 10 MG/ML IJ SOLN
40.0000 mg | Freq: Once | INTRAMUSCULAR | Status: AC
  Administered 2013-04-27: 40 mg via INTRAVENOUS
  Filled 2013-04-27: qty 4

## 2013-04-27 NOTE — Progress Notes (Signed)
PT Cancellation Note  Patient Details Name: Connor Ford MRN: [id number] DOB: August 31, 1945   Cancelled Treatment:    Reason Eval/Treat Not Completed: Medical issues which prohibited therapy.  Still on Bipap. Will follow up Monday.    INGOLD,Neely Kammerer 04/27/2013, 1:07 PM  [facility] [phone] (pager)

## 2013-04-27 NOTE — Progress Notes (Signed)
PULMONARY / CRITICAL CARE MEDICINE  Name: Connor Ford MRN: [id number] DOB: 01-24-1946    ADMISSION DATE:  04/01/2013 CONSULTATION DATE:  04/13/2013  REFERRING MD :  [facility] PRIMARY SERVICE:  PCCM  CHIEF COMPLAINT:  Acute respiratory failure  BRIEF PATIENT DESCRIPTION: 68 y/o w/ PMHx of systolic CHF and sleep apnea admitted on 1/15 with dyspnea, fever, and pulmonary infiltrates. On 1/16, patient developed acute respiratory distress and CXR demonstrated worsening bilateral airspace disease.  PCCM was consulted.  SIGNIFICANT EVENTS / STUDIES:  1/15  Admitted for pneumonia 1/16  Transferred to ICU/Intubated 1/19  Extubated 1/26  Unable to maintain sats on 100% and replaced on BiPAP. 1/26 PM- pt refused BiPAP overnight, cont NRB SpO2- 88-93%  LINES / TUBES: ETT 1/16 >>> 1/19 CVL 1/16 >>>1/22 R. Radial A-line 1/16 >>> 1/20  CULTURES: 1/15 Blood >>>neg 1/15 Urine >>>no growth 1/15 Resp virus panel nasal swab >>> neg 1/17 Resp >>>few candida, negative 1/18 Resp virus panel trach aspir >>> Flu A positive 1/19 Blood >>>neg  ANTIBIOTICS: Ceftriaxone 1/15 >>> 1/16 Azithromycin 1/15 >>> 1/16  Levaquin 1/16 >>> 1/16 Tamiflu 1/16 >>> 1/19 Vancomycin 1/16 >>> 1/21 Zosyn 1/16 >>> 1/20 Rocephin 1/20 >>>1/23 Levaquin 1/23 >>>1/26  Sched Meds: . antiseptic oral rinse  15 mL Mouth Rinse QID  . aspirin  81 mg Oral QHS  . atorvastatin  20 mg Oral q1800  . chlorhexidine  15 mL Mouth Rinse BID  . feeding supplement (RESOURCE BREEZE)  1 Container Oral BID BM  . furosemide  40 mg Oral Daily  . methylPREDNISolone (SOLU-MEDROL) injection  125 mg Intravenous Q6H  . sodium chloride  3 mL Intravenous Q12H  . Warfarin - Pharmacist Dosing Inpatient   Does not apply q1800    INTERVAL HISTORY: Diuresed, desaturation this AM on 100% NRB down to 70's.  VITAL SIGNS: Temp:  [97.3 F (36.3 C)-98 F (36.7 C)] 97.3 F (36.3 C) (01/30 0816) Pulse Rate:  [80-118] 115 (01/30 0816) Resp:  [19-37] 24  (01/30 0816) BP: (88-149)/(44-74) 138/57 mmHg (01/30 0816) SpO2:  [89 %-98 %] 90 % (01/30 0816) FiO2 (%):  [100 %] 100 % (01/30 0447) Weight:  [88.2 kg (194 lb 7.1 oz)] 88.2 kg (194 lb 7.1 oz) (01/30 0418)  HEMODYNAMICS:   VENTILATOR SETTINGS: Vent Mode:  [-]  FiO2 (%):  [100 %] 100 %  INTAKE / OUTPUT: Intake/Output     01/29 0701 - 01/30 0700 01/30 0701 - 01/31 0700   I.V. (mL/kg) 3 (0)    Total Intake(mL/kg) 3 (0)    Urine (mL/kg/hr) 1375 (0.6)    Stool 200 (0.1)    Total Output 1575     Net -1572          Urine Occurrence  1 x   Stool Occurrence  1 x    PHYSICAL EXAMINATION: General:  No acute distress. On BiPAP. Neuro:  Awake, alert. Moves all limbs spontaneously. Follows commands. HEENT: PERRL. Cardiovascular: Rate controlled, rhythm irregularly irregular. 2/6 systolic murmur.  Lungs: Bibasilar crackles otherwise clear  Abdomen:  Soft, nontender, bowel sounds present.  Musculoskeletal: No edema. Chronic venous stasis changes in LE's. Skin: Intact.   LABS:  CBC  Recent Labs Lab 04/25/13 0424 04/26/13 0329 04/27/13 0309  WBC 26.0* 23.6* 22.3*  HGB 11.6* 11.5* 12.7*  HCT 34.8* 34.7* 38.0*  PLT 654* 607* 625*   Coag's  Recent Labs Lab 04/25/13 0424 04/26/13 0329 04/27/13 0309  INR 2.91* 3.07* 3.55*   BMET  Recent Labs  Lab 04/25/13 0424 04/26/13 0329 04/27/13 0309  NA 142 139 141  K 3.8 5.0 4.3  CL 101 101 99  CO2 28 27 29   BUN 34* 36* 34*  CREATININE 0.95 0.86 0.82  GLUCOSE 186* 152* 182*   Electrolytes  Recent Labs Lab 04/24/13 0353 04/25/13 0424 04/26/13 0329 04/27/13 0309  CALCIUM 8.1* 7.9* 7.8* 8.0*  MG 2.1  --  2.1 2.2  PHOS 4.5  --  3.6 3.9    Sepsis Markers No results found for this basename: LATICACIDVEN, PROCALCITON, O2SATVEN,  in the last 168 hours ABG No results found for this basename: PHART, PCO2ART, PO2ART,  in the last 168 hours Liver Enzymes  Recent Labs Lab 04/23/13 0150 04/25/13 0424 04/27/13 0309  AST  183* 218* 201*  ALT 129* 184* 250*  ALKPHOS 115 119* 185*  BILITOT 0.7 0.5 0.9  ALBUMIN 1.7* 1.8* 2.1*   Cardiac Enzymes No results found for this basename: TROPONINI, PROBNP,  in the last 168 hours Glucose  Recent Labs Lab 04/20/13 1145 04/20/13 1539  GLUCAP 124* 152*   CXR:  1/26- no significant changes in bilateral parenchymal changes   ASSESSMENT / PLAN:  PULMONARY A:   Acute hypoxemic respiratory failure. Likely CAP 2/2 Flu A infection.  Desaturating more this AM. P:   - Place back on BiPAP. - Xopenex prn as ordered. - Albuterol neb q6h. - Increased steroids to 125 q6 for three days (1/30 is day 1). - CXR in AM. - Diureses as below. - CT of the chest without contrast noted with evident pneumonitis.  CARDIOVASCULAR A:  H/o CHF. ECHO shows normal EF.  Mod mitral stenosis, pulm HTN 17mmHg PAP AF. Rate controlled. On Coumadin at home. P:  - Metoprolol prn to maintain HR <115. - ASA. - Lipitor. - Hold ACEI in setting of AKI. - Coumadin per pharmacy. - 2D echo with bubble study done and pending.  RENAL A:   AKI. Resolved Hypernatremia. Resolved.  P:   - Hold ACEI. - Trend BMP. - Lasix 40 IV x1 as ordered.  GASTROINTESTINAL A:   Elevated transaminases & low Alb Hepatitis panel => neg P:   - Diet regular as able given respiratory status. - LFTs slowly trending down.  HEMATOLOGIC A:   Mild anemia. VTE Px. Leukocytosis (All abx completed) trending down, on steroids. P:  - Trend CBC. - Coumadin.  INFECTIOUS A:   CAP 2/2 Flu A infection.  P:   - Completed all abx. - Leukocytosis, trend fever curve, will hold of abx for now unless sputum production increases and become purulent or fever starts.  ENDOCRINE  A:   Normoglycemia.   P:   - Monitor glucose on BMP.  NEUROLOGIC A:   Hx ETOH P:   - Ativan prn PO for anxiety, increase to 1 mg q4 hrs.  TODAY'S SUMMARY:  Increased steroids and continue diureses, echo with bubble study to  evaluate for a shunt done and pending and chest CT without contrast done and evidence of pneumonitis thus steroids increased.  Patient is looking more lethargic this AM, if continues to desaturate throughout the day will need to transfer back to the ICU.  CC time 35 minutes.  Rush Farmer, M.D. [facility] Pulmonary/Critical Care Medicine. Pager: [phone]. After hours pager: [phone].

## 2013-04-27 NOTE — Progress Notes (Signed)
ANTICOAGULATION CONSULT NOTE - Follow Up Consult  Pharmacy Consult for Coumadin Indication: atrial fibrillation  No Known Allergies  Patient Measurements: Height: 6\' 4"  (193 cm) Weight: 194 lb 7.1 oz (88.2 kg) IBW/kg (Calculated) : 86.8  Vital Signs: Temp: 98 F (36.7 C) (01/30 0418) Temp src: Axillary (01/30 0418) BP: 117/61 mmHg (01/30 0447) Pulse Rate: 96 (01/30 0747)  Labs:  Recent Labs  04/25/13 0424 04/26/13 0329 04/27/13 0309  HGB 11.6* 11.5* 12.7*  HCT 34.8* 34.7* 38.0*  PLT 654* 607* 625*  LABPROT 29.4* 30.6* 34.2*  INR 2.91* 3.07* 3.55*  CREATININE 0.95 0.86 0.82    Estimated Creatinine Clearance: 107.3 ml/min (by C-G formula based on Cr of 0.82).  Assessment:  58 yom continues on coumadin for hx afib. INR remains above goal at 3.55 and trending up. WBC elevated but otherwise CBC remains stable. No bleeding noted. Home regimen for Coumadin was 2.5mg  and 5mg  alternating.    Goal of Therapy:  INR 2-3   Plan:  1. No coumadin tonight 2. F/u AM INR  Sherlon Handing, PharmD, BCPS Clinical pharmacist, pager [phone]  04/27/2013 8:15 AM

## 2013-04-27 NOTE — Consult Note (Signed)
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 Was intubated and progressed off vent. To sdu now and continues to increase infiltrates, requiring 100% NRB but able to speak and eat. Requires int NIMV. Pt has requested a second opinion on management and plan of care. To note amio was just stopped on admission.  PAST MEDICAL HISTORY :  Past Medical History  Diagnosis Date  . 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 Last echo 03/2007 with mild AS, mild-mod AR, mild-mod MR, mild MS   . Asbestos exposure     "when I was a young man"  . Peripheral vascular disease     a) reported hx of infrainguinal arterial occlusive disease. b) Bilateral CEA  in 2001 with RCEA operation complicated by neck hematoma and l forearm hematoma/compartment syndrome s/p fasciotomy  . Hyperlipidemia   . CAD (coronary artery disease)      followed by dr Bettina Gavia.last seen 03/2011  . HTN (hypertension)     followed by dr Lelon Frohlich  . Permanent atrial fibrillation     With LBBB pattern. On Chronic Coumadin  . Heart murmur   . CHF (congestive heart failure)   . Pneumonia 03/2013    "this is my first bout w/pneumonia" (04/27/2013)  . Exertional shortness of breath   . History of blood transfusion     "related to LUE ORs"   . Sleep apnea     "mild; doesn''t wear mask (04/26/2013)  . Basal cell carcinoma     "all over my body; arms, face, head" (04/09/2013)   Past  Surgical History  Procedure Laterality Date  . Carotid endarterectomy Bilateral   . Fasciotomy      for compartment syndrome of L forearm following RCEA  . Mitral valve repair  02/1999  . Fasciotomy  07/14/2011    Procedure: FASCIOTOMY;  Surgeon: Newt Minion, MD;  Location: [facility];  Service: Orthopedics;  Laterality: Left;  . I&d extremity  07/16/2011    Procedure: IRRIGATION AND DEBRIDEMENT EXTREMITY;  Surgeon: Newt Minion, MD;  Location: [facility];  Service: Orthopedics;  Laterality: Left;  Irrigation and Debridement Left Forearm, apply Acell and Wound VAC  . I&d extremity  08/06/2011    Procedure: IRRIGATION AND DEBRIDEMENT EXTREMITY;  Surgeon: Newt Minion, MD;  Location: [facility];  Service: Orthopedics;  Laterality: Left;  Irrigation and Debridement/Wound Closure Left Forearm  . Femur fracture surgery Right 2009    "put rod in"  . Lung surgery Left 2000    LEFT RUPTURED LESION  2008 [facility]  . Skin split graft  09/08/2011    Procedure: SKIN GRAFT SPLIT THICKNESS;  Surgeon: Newt Minion, MD;  Location: [facility];  Service: Orthopedics;  Laterality: Left;  Split Thickness Skin Graft from Left Thigh to Left Forearm   . Eye surgery    . Vasectomy    . Coronary artery bypass graft  02/1999    "CABG X3"  .  Cardiac catheterization  02/1999  . Cataract extraction w/ intraocular lens  implant, bilateral Bilateral 2011-2014   Prior to Admission medications   Medication Sig Start Date End Date Taking? Authorizing Provider  amiodarone (PACERONE) 200 MG tablet Take 200-400 mg by mouth See admin instructions. Takes 400mg  on M,W,F each week All other days takes 200mg    Yes Historical Provider, MD  aspirin EC 81 MG tablet Take 81 mg by mouth at bedtime.    Yes Historical Provider, MD  atorvastatin (LIPITOR) 20 MG tablet Take 20 mg by mouth daily at 6 PM.    Yes Historical Provider, MD  digoxin (LANOXIN) 0.125 MG tablet Take 125 mcg by mouth daily.   Yes Historical Provider, MD  diltiazem (DILACOR  XR) 180 MG 24 hr capsule Take 180 mg by mouth daily.   Yes Historical Provider, MD  furosemide (LASIX) 40 MG tablet Take 40 mg by mouth daily.   Yes Historical Provider, MD  levofloxacin (LEVAQUIN) 500 MG tablet Take 1 tablet (500 mg total) by mouth daily. 04/09/13  Yes Alveda Reasons, MD  PE-DM-APAP & Doxylamin-DM-APAP (VICKS DAYQUIL/NYQUIL CLD & FLU) (LIQUID) MISC Take 2 capsules by mouth 2 (two) times daily as needed (for cold).   Yes Historical Provider, MD  potassium chloride (K-DUR,KLOR-CON) 10 MEQ tablet Take 10 mEq by mouth daily.   Yes Historical Provider, MD  ramipril (ALTACE) 5 MG capsule Take 5 mg by mouth daily.   Yes Historical Provider, MD  VITAMIN E PO Take 1 tablet by mouth daily.   Yes Historical Provider, MD  warfarin (COUMADIN) 5 MG tablet Take 2.5-5 mg by mouth See admin instructions. Alternating dosage 2.5mg , 5mg , 2.5mg , etc    Historical Provider, MD   No Known Allergies  FAMILY HISTORY:  Family History  Problem Relation Age of Onset  . Stroke Mother     Died at 4 of a stroke  . Lung disease Brother     Brother died of black lung disease   SOCIAL HISTORY:  reports that he quit smoking about 14 years ago. His smoking use included Cigarettes. He has a 60 pack-year smoking history. He quit smokeless tobacco use about 14 years ago. His smokeless tobacco use included Snuff. He reports that he drinks about 8.4 ounces of alcohol per week. He reports that he does not use illicit drugs.  REVIEW OF SYSTEMS:   Denies hemoptyisis, no n/v / d No abdo pain  SUBJECTIVE:   VITAL SIGNS: Temp:  [97.3 F (36.3 C)-98 F (36.7 C)] 97.5 F (36.4 C) (01/30 1228) Pulse Rate:  [80-118] 89 (01/30 1228) Resp:  [19-37] 19 (01/30 1228) BP: (88-149)/(44-74) 121/65 mmHg (01/30 1228) SpO2:  [88 %-98 %] 97 % (01/30 1228) FiO2 (%):  [100 %] 100 % (01/30 0447) Weight:  [88.2 kg (194 lb 7.1 oz)] 88.2 kg (194 lb 7.1 oz) (01/30 0418)  PHYSICAL EXAMINATION: General:  Awake  talkative Neuro:  Nonfocal, perrl  HEENT:  jvd wnl Neck:  No stridor Cardiovascular:  s1 s2 irt Lungs:  Coarse bilateral Abdomen:  Soft, Bs wnl, no r/g Musculoskeletal:  Min edema Skin:  No rash   Recent Labs Lab 04/25/13 0424 04/26/13 0329 04/27/13 0309  NA 142 139 141  K 3.8 5.0 4.3  CL 101 101 99  CO2 28 27 29   BUN 34* 36* 34*  CREATININE 0.95 0.86 0.82  GLUCOSE 186* 152* 182*    Recent Labs Lab 04/25/13 0424 04/26/13 0329 04/27/13 0309  HGB 11.6* 11.5* 12.7*  HCT 34.8* 34.7* 38.0*  WBC 26.0* 23.6* 22.3*  PLT 654* 607* 625*   Ct Chest Wo Contrast  04/26/2013   CLINICAL DATA:  CHF, sleep apnea, dyspnea, fever. Acute respiratory distress.  EXAM: CT CHEST WITHOUT CONTRAST  TECHNIQUE: Multidetector CT imaging of the chest was performed following the standard protocol without IV contrast.  COMPARISON:  04/26/2013 radiograph, 08/07/2011 chest CT  FINDINGS: Scattered atherosclerosis of the aorta and branching vessels without aneurysmal dilatation. Cardiomegaly. Coronary artery and aortic valvular calcifications. Mitral valvular calcifications. No pericardial effusion. Trace left pleural effusion. Calcified pleural plaques on the left. Prominent mediastinal nodes, the largest of which is a subcarinal lymph node measuring 1.9 cm short axis.  Images are degraded by respiratory motion. Central airways are grossly patent. Centrilobular emphysema. Biapical scarring. No pneumothorax. Multiple calcified granuloma on the right. Multifocal patchy airspace and ground-glass opacities, more confluent within the lower lobes and periphery. Interlobular septal thickening, lower lobe predominant.  Upper abdominal images show hepatic and splenic granuloma.  Status post median sternotomy.  No acute osseous finding.  IMPRESSION: Severe multifocal airspace and interstitial opacities, may reflect pneumonia, pneumonitis, or pulmonary edema.  Cardiomegaly.  Trace left pleural effusion.  Prominent mediastinal  lymph nodes are nonspecific and may be reactive in this setting. Recommend attention on follow-up.  Calcified left pleural plaques may reflect sequelae of prior infection, hemothorax, or asbestos exposure. Background emphysema.   Electronically Signed   By: Carlos Levering M.D.   On: 04/26/2013 21:45   Dg Chest Port 1 View  04/27/2013   CLINICAL DATA:  Acute respiratory failure.  EXAM: PORTABLE CHEST - 1 VIEW  COMPARISON:  04/26/2013  FINDINGS: Extensive bilateral pulmonary infiltrates are essentially unchanged. Persistent cardiomegaly. No acute osseous abnormality.  IMPRESSION: No change.  Persistent extensive bilateral pulmonary infiltrates.   Electronically Signed   By: Rozetta Nunnery M.D.   On: 04/27/2013 07:18   Dg Chest Port 1 View  04/26/2013   CLINICAL DATA:  Respiratory failure.  EXAM: PORTABLE CHEST - 1 VIEW  COMPARISON:  04/24/2013.  FINDINGS: Prior CABG. Cardiomegaly. Dense bilateral pulmonary airspace disease present. This is unchanged. No pleural effusion or pneumothorax.  IMPRESSION: 1. Persistent unchanged dense bilateral pulmonary airspace disease. 2. Prior CABG.  Cardiomegaly.   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 Answered -consider use of high flow oximiter and int bipap to continue -could also add 6 lit under 100% if needed for comfort and to avoid bipap with normal WOB -Agree steroids in setting pleural disease and escalating inflammatory ards -assess esr, and continue to avoid amio for life likely -continue neg balance lasix -if spike, add hcap coverage, low threshold -he understood he also may need reintubation is he were to decline   Lavon Paganini. Titus Mould, MD, [place] Pgr: [facility]  [facility] [place] Pager: [phone]  04/27/2013, 2:51 PM

## 2013-04-27 NOTE — Progress Notes (Signed)
  Echocardiogram 2D Echocardiogram limited for bubble study only has been performed.  Zalyn Amend FRANCES 04/27/2013, 10:38 AM

## 2013-04-28 DIAGNOSIS — J9600 Acute respiratory failure, unspecified whether with hypoxia or hypercapnia: Secondary | ICD-10-CM

## 2013-04-28 LAB — BASIC METABOLIC PANEL
BUN: 33 mg/dL — AB (ref 6–23)
CALCIUM: 7.9 mg/dL — AB (ref 8.4–10.5)
CHLORIDE: 99 meq/L (ref 96–112)
CO2: 32 mEq/L (ref 19–32)
CREATININE: 0.83 mg/dL (ref 0.50–1.35)
GFR calc Af Amer: >90 mL/min (ref >90)
GFR calc non Af Amer: 89 mL/min — ABNORMAL LOW (ref >90)
Glucose, Bld: 210 mg/dL — ABNORMAL HIGH (ref 70–99)
Potassium: 4.6 mEq/L (ref 3.7–5.3)
Sodium: 140 mEq/L (ref 137–147)

## 2013-04-28 LAB — MAGNESIUM: Magnesium: 2.2 mg/dL (ref 1.5–2.5)

## 2013-04-28 LAB — CBC
HEMATOCRIT: 37.5 % — AB (ref 39.0–52.0)
Hemoglobin: 12.1 g/dL — ABNORMAL LOW (ref 13.0–17.0)
MCH: 30.4 pg (ref 26.0–34.0)
MCHC: 32.3 g/dL (ref 30.0–36.0)
MCV: 94.2 fL (ref 78.0–100.0)
PLATELETS: 467 10*3/uL — AB (ref 150–400)
RBC: 3.98 MIL/uL — ABNORMAL LOW (ref 4.22–5.81)
RDW: 14.5 % (ref 11.5–15.5)
WBC: 27.4 10*3/uL — AB (ref 4.0–10.5)

## 2013-04-28 LAB — SEDIMENTATION RATE: Sed Rate: 1 mm/hr (ref 0–16)

## 2013-04-28 LAB — PROTIME-INR
INR: 4.05 — ABNORMAL HIGH (ref 0.00–1.49)
Prothrombin Time: 37.8 seconds — ABNORMAL HIGH (ref 11.6–15.2)

## 2013-04-28 LAB — PHOSPHORUS: Phosphorus: 4.3 mg/dL (ref 2.3–4.6)

## 2013-04-28 NOTE — Progress Notes (Signed)
PULMONARY / CRITICAL CARE MEDICINE  Name: Connor Ford MRN: [id number] DOB: [birth year]    ADMISSION DATE:  04/01/2013 CONSULTATION DATE:  04/13/2013  REFERRING MD :  [facility] PRIMARY SERVICE:  PCCM  CHIEF COMPLAINT:  Acute respiratory failure  BRIEF PATIENT DESCRIPTION: 68 y/o w/ PMHx of systolic CHF and sleep apnea admitted on 1/15 with dyspnea, fever, and pulmonary infiltrates. On 1/16, patient developed acute respiratory distress and CXR demonstrated worsening bilateral airspace disease.  PCCM was consulted.  SIGNIFICANT EVENTS / STUDIES:  1/15  Admitted for pneumonia 1/16  Transferred to ICU/Intubated 1/19  Extubated 1/26  Unable to maintain sats on 100% and replaced on BiPAP. 1/26 PM- pt refused BiPAP overnight, cont NRB SpO2- 88-93%  LINES / TUBES: ETT 1/16 >>> 1/19 CVL 1/16 >>>1/22 R. Radial A-line 1/16 >>> 1/20  CULTURES: 1/15 Blood >>>neg 1/15 Urine >>>no growth 1/15 Resp virus panel nasal swab >>> neg 1/17 Resp >>>few candida, negative 1/18 Resp virus panel trach aspir >>> Flu A positive 1/19 Blood >>>neg  ANTIBIOTICS: Ceftriaxone 1/15 >>> 1/16 Azithromycin 1/15 >>> 1/16  Levaquin 1/16 >>> 1/16 Tamiflu 1/16 >>> 1/19 Vancomycin 1/16 >>> 1/21 Zosyn 1/16 >>> 1/20 Rocephin 1/20 >>>1/23 Levaquin 1/23 >>>1/26  Sched Meds: . antiseptic oral rinse  15 mL Mouth Rinse QID  . aspirin  81 mg Oral QHS  . atorvastatin  20 mg Oral q1800  . chlorhexidine  15 mL Mouth Rinse BID  . feeding supplement (RESOURCE BREEZE)  1 Container Oral BID BM  . furosemide  40 mg Oral Daily  . methylPREDNISolone (SOLU-MEDROL) injection  125 mg Intravenous Q6H  . sodium chloride  3 mL Intravenous Q12H  . Warfarin - Pharmacist Dosing Inpatient   Does not apply q1800    INTERVAL HISTORY:  Pt remains on bipap with adequate oxygen saturations in 90s.  He hates the treatment.  Mild to moderate increased wob.  VITAL SIGNS: Temp:  [96.6 F (35.9 C)-97.9 F (36.6 C)] 97.7 F (36.5 C) (01/31  0900) Pulse Rate:  [89-132] 132 (01/31 0900) Resp:  [19-38] 38 (01/31 0900) BP: (121-165)/(55-84) 165/77 mmHg (01/31 0900) SpO2:  [87 %-99 %] 97 % (01/31 0900) FiO2 (%):  [100 %] 100 % (01/31 0455) Weight:  [89 kg (196 lb 3.4 oz)] 89 kg (196 lb 3.4 oz) (01/31 0455)   INTAKE / OUTPUT: Intake/Output     01/30 0701 - 01/31 0700 01/31 0701 - 02/01 0700   I.V. (mL/kg) 3 (0)    Total Intake(mL/kg) 3 (0)    Urine (mL/kg/hr) 1650 (0.8)    Stool     Total Output 1650     Net -1647          Urine Occurrence 1 x    Stool Occurrence 1 x     PHYSICAL EXAMINATION: General:  Wd male in mild to moderate increased wob, on  Bilevel. Neuro:  Awake, alert. Moves all limbs spontaneously. Follows commands. HEENT: unable to assess due to bilevel mask. Cardiovascular: Rate controlled, rhythm irregularly irregular. 2/6 systolic murmur.  Lungs: diffuse scattered crackles, no wheezing Abdomen:  Soft, nontender, bowel sounds present.  Musculoskeletal: No edema. Chronic venous stasis changes in LE's.  LABS:  CBC  Recent Labs Lab 04/26/13 0329 04/27/13 0309 04/28/13 0318  WBC 23.6* 22.3* 27.4*  HGB 11.5* 12.7* 12.1*  HCT 34.7* 38.0* 37.5*  PLT 607* 625* 467*   Coag's  Recent Labs Lab 04/26/13 0329 04/27/13 0309 04/28/13 0318  INR 3.07* 3.55* 4.05*   BMET  Recent Labs Lab 04/26/13 0329 04/27/13 0309 04/28/13 0318  NA 139 141 140  K 5.0 4.3 4.6  CL 101 99 99  CO2 27 29 32  BUN 36* 34* 33*  CREATININE 0.86 0.82 0.83  GLUCOSE 152* 182* 210*   Electrolytes  Recent Labs Lab 04/26/13 0329 04/27/13 0309 04/28/13 0318  CALCIUM 7.8* 8.0* 7.9*  MG 2.1 2.2 2.2  PHOS 3.6 3.9 4.3    Liver Enzymes  Recent Labs Lab 04/23/13 0150 04/25/13 0424 04/27/13 0309  AST 183* 218* 201*  ALT 129* 184* 250*  ALKPHOS 115 119* 185*  BILITOT 0.7 0.5 0.9  ALBUMIN 1.7* 1.8* 2.1*     ASSESSMENT / PLAN:  PULMONARY A:   Acute hypoxemic respiratory failure. Likely CAP 2/2 Flu A  infection.  Desaturating more this AM. He continues to require bilevel support, and at high risk to require mechanical ventilation.  Needs to be monitored closely  P:   - Increased steroids to 125 q6 for three days (1/30 is day 1). - low threshold for transfer back to ICU with mechanical ventilation.   CARDIOVASCULAR A:  H/o CHF. ECHO shows normal EF.  Mod mitral stenosis, pulm HTN 5mmHg PAP.  Bubble study inadequate due to positional iv.  AF. Rate controlled. On Coumadin at home. P:  - Metoprolol prn to maintain HR <115. - Coumadin per pharmacy.   GASTROINTESTINAL A:   Elevated transaminases & low Alb Hepatitis panel => neg P:   - Diet regular as able given respiratory status. - LFTs slowly trending down.   INFECTIOUS A:   CAP 2/2 Flu A infection.  P:   - Completed all abx. - Leukocytosis, trend fever curve, will hold of abx for now unless sputum production increases and become purulent or fever starts.

## 2013-04-28 NOTE — Progress Notes (Signed)
ANTICOAGULATION CONSULT NOTE - Follow Up Consult  Pharmacy Consult for Coumadin Indication: atrial fibrillation  No Known Allergies  Patient Measurements: Height: 6\' 4"  (193 cm) Weight: 196 lb 3.4 oz (89 kg) IBW/kg (Calculated) : 86.8  Vital Signs: Temp: 97.9 F (36.6 C) (01/31 1200) Temp src: Axillary (01/31 1200) BP: 126/61 mmHg (01/31 1200) Pulse Rate: 102 (01/31 1200)  Labs:  Recent Labs  04/26/13 0329 04/27/13 0309 04/28/13 0318  HGB 11.5* 12.7* 12.1*  HCT 34.7* 38.0* 37.5*  PLT 607* 625* 467*  LABPROT 30.6* 34.2* 37.8*  INR 3.07* 3.55* 4.05*  CREATININE 0.86 0.82 0.83    Estimated Creatinine Clearance: 106 ml/min (by C-G formula based on Cr of 0.83).  Assessment:  6 yom continues on coumadin for hx afib. INR remains above goal at 4.05 and trending up. WBC elevated but otherwise CBC remains stable. No bleeding noted. Home regimen for Coumadin was 2.5mg  and 5mg  alternating.    Goal of Therapy:  INR 2-3   Plan:  1. No coumadin tonight 2. F/u AM INR  Thank you, Vivia Ewing, PharmD Clinical Pharmacist - Resident Pager: [phone] Pharmacy: [phone] 04/28/2013 1:19 PM

## 2013-04-28 NOTE — Progress Notes (Signed)
Pt is non-compliant with the BiPAP. He refuses to have to the head strap over his head and instead holds the mask over his face with the air leaking. He also wants to eat while on the BiPAP and is aware that his sats do drop to 68 at times. Pt is weak and unable to co-ordinate the mask and his food, so RN remained in the room until he finished his breakfast. Pt has been educated on the importance of keeping the BiPAP mask on. His sats are between 90-91 while in the BiPAP.

## 2013-04-29 ENCOUNTER — Inpatient Hospital Stay (HOSPITAL_COMMUNITY): Payer: Medicare PPO

## 2013-04-29 DIAGNOSIS — J9600 Acute respiratory failure, unspecified whether with hypoxia or hypercapnia: Secondary | ICD-10-CM

## 2013-04-29 LAB — BLOOD GAS, ARTERIAL
ACID-BASE EXCESS: 8.6 mmol/L — AB (ref 0.0–2.0)
Bicarbonate: 33.1 mEq/L — ABNORMAL HIGH (ref 20.0–24.0)
DELIVERY SYSTEMS: POSITIVE
Drawn by: 27022
Expiratory PAP: 8
FIO2: 1 %
INSPIRATORY PAP: 8
LHR: 8 {breaths}/min
O2 SAT: 86.7 %
PATIENT TEMPERATURE: 98.6
TCO2: 34.6 mmol/L (ref 0–100)
pCO2 arterial: 49.5 mmHg — ABNORMAL HIGH (ref 35.0–45.0)
pH, Arterial: 7.44 (ref 7.350–7.450)
pO2, Arterial: 54.2 mmHg — ABNORMAL LOW (ref 80.0–100.0)

## 2013-04-29 LAB — POCT I-STAT 3, ART BLOOD GAS (G3+)
Acid-base deficit: 3 mmol/L — ABNORMAL HIGH (ref 0.0–2.0)
Bicarbonate: 32.3 mEq/L — ABNORMAL HIGH (ref 20.0–24.0)
O2 SAT: 93 %
Patient temperature: 97.5
TCO2: 36 mmol/L (ref 0–100)
pCO2 arterial: 121.9 mmHg (ref 35.0–45.0)
pH, Arterial: 7.027 — CL (ref 7.350–7.450)
pO2, Arterial: 101 mmHg — ABNORMAL HIGH (ref 80.0–100.0)

## 2013-04-29 LAB — PROTIME-INR
INR: 3.69 — AB (ref 0.00–1.49)
PROTHROMBIN TIME: 35.2 s — AB (ref 11.6–15.2)

## 2013-04-29 MED ORDER — FENTANYL CITRATE 0.05 MG/ML IJ SOLN
INTRAMUSCULAR | Status: AC
Start: 2013-04-29 — End: 2013-04-29
  Administered 2013-04-29: 100 ug
  Filled 2013-04-29: qty 6

## 2013-04-29 MED ORDER — ROCURONIUM BROMIDE 50 MG/5ML IV SOLN
1.0000 mg/kg | Freq: Once | INTRAVENOUS | Status: AC
  Administered 2013-04-29: 50 mg via INTRAVENOUS
  Filled 2013-04-29: qty 8.82

## 2013-04-29 MED ORDER — SODIUM CHLORIDE 0.9 % IV SOLN
25.0000 ug/h | INTRAVENOUS | Status: DC
  Administered 2013-04-29: 25 ug/h via INTRAVENOUS
  Administered 2013-04-30: 250 ug/h via INTRAVENOUS
  Administered 2013-05-01: 150 ug/h via INTRAVENOUS
  Administered 2013-05-02: 100 ug/h via INTRAVENOUS
  Filled 2013-04-29 (×4): qty 50

## 2013-04-29 MED ORDER — SODIUM BICARBONATE 8.4 % IV SOLN
INTRAVENOUS | Status: DC
Start: 2013-04-29 — End: 2013-04-30
  Administered 2013-04-30: 03:00:00 via INTRAVENOUS
  Filled 2013-04-29 (×6): qty 150

## 2013-04-29 MED ORDER — SODIUM CHLORIDE 0.9 % IV SOLN: INTRAVENOUS | Status: DC

## 2013-04-29 MED ORDER — SODIUM CHLORIDE 0.9 % IV BOLUS (SEPSIS)
500.0000 mL | Freq: Once | INTRAVENOUS | Status: AC
  Administered 2013-04-29: 500 mL via INTRAVENOUS

## 2013-04-29 MED ORDER — DILTIAZEM HCL 100 MG IV SOLR
5.0000 mg/h | INTRAVENOUS | Status: DC
  Administered 2013-04-29: 10 mg/h via INTRAVENOUS
  Filled 2013-04-29: qty 100

## 2013-04-29 MED ORDER — DEXMEDETOMIDINE HCL IN NACL 200 MCG/50ML IV SOLN
0.4000 ug/kg/h | INTRAVENOUS | Status: DC
  Administered 2013-04-29: 0.2 ug/kg/h via INTRAVENOUS
  Filled 2013-04-29: qty 50

## 2013-04-29 MED ORDER — FENTANYL CITRATE 0.05 MG/ML IJ SOLN
INTRAMUSCULAR | Status: AC
  Administered 2013-04-29: 100 ug
  Filled 2013-04-29: qty 2

## 2013-04-29 MED ORDER — FENTANYL CITRATE 0.05 MG/ML IJ SOLN
INTRAMUSCULAR | Status: AC
  Filled 2013-04-29: qty 2

## 2013-04-29 MED ORDER — PHENYLEPHRINE HCL 10 MG/ML IJ SOLN
30.0000 ug/min | INTRAVENOUS | Status: DC
  Filled 2013-04-29: qty 1

## 2013-04-29 MED ORDER — SODIUM CHLORIDE 0.9 % IV SOLN
INTRAVENOUS | Status: DC
  Administered 2013-04-30: 01:00:00 via INTRAVENOUS
  Administered 2013-04-30: 20 mL/h via INTRAVENOUS

## 2013-04-29 MED ORDER — MIDAZOLAM HCL 2 MG/2ML IJ SOLN
INTRAMUSCULAR | Status: AC
  Administered 2013-04-29: 2 mg
  Filled 2013-04-29: qty 6

## 2013-04-29 MED ORDER — PANTOPRAZOLE SODIUM 40 MG IV SOLR: 40.0000 mg | INTRAVENOUS | Status: DC

## 2013-04-29 MED ORDER — MIDAZOLAM HCL 2 MG/2ML IJ SOLN
INTRAMUSCULAR | Status: AC
  Filled 2013-04-29: qty 2

## 2013-04-29 MED ORDER — PANTOPRAZOLE SODIUM 40 MG IV SOLR
40.0000 mg | INTRAVENOUS | Status: DC
  Administered 2013-04-29 – 2013-05-01 (×3): 40 mg via INTRAVENOUS
  Filled 2013-04-29 (×5): qty 40

## 2013-04-29 MED ORDER — MIDAZOLAM HCL 2 MG/2ML IJ SOLN
INTRAMUSCULAR | Status: AC
Start: 2013-04-29 — End: 2013-04-29
  Filled 2013-04-29: qty 2

## 2013-04-29 MED ORDER — MIDAZOLAM HCL 2 MG/2ML IJ SOLN
INTRAMUSCULAR | Status: AC
  Administered 2013-04-29: 2 mg
  Filled 2013-04-29: qty 2

## 2013-04-29 MED ORDER — NOREPINEPHRINE BITARTRATE 1 MG/ML IJ SOLN
2.0000 ug/min | INTRAVENOUS | Status: DC
  Administered 2013-04-29: 8 ug/min via INTRAVENOUS
  Administered 2013-04-29: 15.013 ug/min via INTRAVENOUS
  Filled 2013-04-29 (×2): qty 4

## 2013-04-29 MED ORDER — DEXTROSE 5 % IV SOLN
100.0000 ug/min | INTRAVENOUS | Status: DC
  Filled 2013-04-29: qty 4

## 2013-04-29 MED ORDER — AMIODARONE HCL IN DEXTROSE 360-4.14 MG/200ML-% IV SOLN
INTRAVENOUS | Status: AC
  Filled 2013-04-29: qty 200

## 2013-04-29 MED ORDER — NOREPINEPHRINE BITARTRATE 1 MG/ML IJ SOLN
2.0000 ug/min | INTRAVENOUS | Status: DC
  Administered 2013-04-29: 15 ug/min via INTRAVENOUS
  Administered 2013-05-01: 2 ug/min via INTRAVENOUS
  Filled 2013-04-29 (×2): qty 16

## 2013-04-29 MED ORDER — ETOMIDATE 2 MG/ML IV SOLN
0.3000 mg/kg | Freq: Once | INTRAVENOUS | Status: AC
  Administered 2013-04-29: 20 mg via INTRAVENOUS

## 2013-04-29 NOTE — Progress Notes (Signed)
PULMONARY / CRITICAL CARE MEDICINE  Name: DOVER HEAD MRN: [id number] DOB: 08-16-1945    ADMISSION DATE:  04/27/2013 CONSULTATION DATE:  04/13/2013  REFERRING MD :  [facility] PRIMARY SERVICE:  PCCM  CHIEF COMPLAINT:  Acute respiratory failure  BRIEF PATIENT DESCRIPTION: 68 y/o w/ PMHx of systolic CHF and sleep apnea admitted on 1/15 with dyspnea, fever, and pulmonary infiltrates. On 1/16, patient developed acute respiratory distress and CXR demonstrated worsening bilateral airspace disease.  PCCM was consulted.  SIGNIFICANT EVENTS / STUDIES:  1/15  Admitted for pneumonia 1/16  Transferred to ICU/Intubated 1/19  Extubated 1/26  Unable to maintain sats on 100% and replaced on BiPAP. 1/26 PM- pt refused BiPAP overnight, cont NRB SpO2- 88-93%  LINES / TUBES: ETT 1/16 >>> 1/19 CVL 1/16 >>>1/22 R. Radial A-line 1/16 >>> 1/20  CULTURES: 1/15 Blood >>>neg 1/15 Urine >>>no growth 1/15 Resp virus panel nasal swab >>> neg 1/17 Resp >>>few candida, negative 1/18 Resp virus panel trach aspir >>> Flu A positive 1/19 Blood >>>neg  ANTIBIOTICS: Ceftriaxone 1/15 >>> 1/16 Azithromycin 1/15 >>> 1/16  Levaquin 1/16 >>> 1/16 Tamiflu 1/16 >>> 1/19 Vancomycin 1/16 >>> 1/21 Zosyn 1/16 >>> 1/20 Rocephin 1/20 >>>1/23 Levaquin 1/23 >>>1/26  Sched Meds: . antiseptic oral rinse  15 mL Mouth Rinse QID  . aspirin  81 mg Oral QHS  . atorvastatin  20 mg Oral q1800  . chlorhexidine  15 mL Mouth Rinse BID  . feeding supplement (RESOURCE BREEZE)  1 Container Oral BID BM  . furosemide  40 mg Oral Daily  . methylPREDNISolone (SOLU-MEDROL) injection  125 mg Intravenous Q6H  . sodium chloride  3 mL Intravenous Q12H  . Warfarin - Pharmacist Dosing Inpatient   Does not apply q1800    INTERVAL HISTORY:  Pt continues to do poorly despite bipap.  Work of breathing elevated, and have not been able to increase sats after a trial of NRB this am.  On 100% fio2 with bipap 16/8 and rate of 12.  BM84.   VITAL  SIGNS: Temp:  [97.5 F (36.4 C)-97.9 F (36.6 C)] 97.6 F (36.4 C) (02/01 0852) Pulse Rate:  [53-138] 138 (02/01 0852) Resp:  [22-41] 41 (02/01 0852) BP: (107-136)/(45-84) 124/62 mmHg (02/01 0852) SpO2:  [80 %-100 %] 84 % (02/01 0852) FiO2 (%):  [100 %] 100 % (02/01 0852) Weight:  [88.2 kg (194 lb 7.1 oz)] 88.2 kg (194 lb 7.1 oz) (02/01 0442)   INTAKE / OUTPUT: Intake/Output     01/31 0701 - 02/01 0700 02/01 0701 - 02/02 0700   P.O. 700    I.V. (mL/kg)     Total Intake(mL/kg) 700 (7.9)    Urine (mL/kg/hr) 1375 (0.6)    Total Output 1375     Net -675           PHYSICAL EXAMINATION: General:  Wd male with significant increased wob, on  Bilevel. Neuro:  Poor  mentation. Withdraws to pain, doesn't communicate HEENT: unable to assess due to bilevel mask. Cardiovascular: Rate controlled, rhythm irregularly irregular. 2/6 systolic murmur.  Lungs: diffuse scattered crackles, no wheezing Abdomen:  Soft, nontender, bowel sounds present.  Musculoskeletal: No edema. Chronic venous stasis changes in LE's.  LABS:  CBC  Recent Labs Lab 04/26/13 0329 04/27/13 0309 04/28/13 0318  WBC 23.6* 22.3* 27.4*  HGB 11.5* 12.7* 12.1*  HCT 34.7* 38.0* 37.5*  PLT 607* 625* 467*   Coag's  Recent Labs Lab 04/27/13 0309 04/28/13 0318 04/29/13 0645  INR 3.55* 4.05* 3.69*  BMET  Recent Labs Lab 04/26/13 0329 04/27/13 0309 04/28/13 0318  NA 139 141 140  K 5.0 4.3 4.6  CL 101 99 99  CO2 27 29 32  BUN 36* 34* 33*  CREATININE 0.86 0.82 0.83  GLUCOSE 152* 182* 210*   Electrolytes  Recent Labs Lab 04/26/13 0329 04/27/13 0309 04/28/13 0318  CALCIUM 7.8* 8.0* 7.9*  MG 2.1 2.2 2.2  PHOS 3.6 3.9 4.3    Liver Enzymes  Recent Labs Lab 04/23/13 0150 04/25/13 0424 04/27/13 0309  AST 183* 218* 201*  ALT 129* 184* 250*  ALKPHOS 115 119* 185*  BILITOT 0.7 0.5 0.9  ALBUMIN 1.7* 1.8* 2.1*     ASSESSMENT / PLAN:  PULMONARY A:   Acute hypoxemic respiratory  failure. Likely CAP 2/2 Flu A infection with secondary ARDS Desaturating more this AM. Pt doing poorly despite bipap, and unable to recruit adequately with NIPPV.  We will either need to move toward comfort measures, or treat more aggressively with ETI and vent, working on recruitment.  The pt wishes aggressive care per nursing staff, and will therefore move to ICU for mechanical vent support.  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  P:   -transfer to ICU for ETI/vent -decrease steroids  CARDIOVASCULAR A:  H/o CHF. ECHO shows normal EF.  Mod mitral stenosis, pulm HTN 71mmHg PAP.  Bubble study inadequate due to positional iv.  AF. Rate controlled. On Coumadin at home. P:  - Metoprolol prn to maintain HR <115. - Coumadin per pharmacy.   GASTROINTESTINAL A:   Elevated transaminases & low Alb Hepatitis panel => neg P:   - Diet regular as able given respiratory status. - LFTs slowly trending down.   INFECTIOUS A:   CAP 2/2 Flu A infection.  P:   - Completed all abx. - Leukocytosis, trend fever curve, will hold of abx for now unless sputum production increases and become purulent or fever starts.  Critical care time 37 min.

## 2013-04-29 NOTE — Progress Notes (Signed)
Dr made aware that uop = 10 ml past 1.5 hrs

## 2013-04-29 NOTE — Progress Notes (Signed)
Patient persistently in respiratory acidosis.  Did not tolerate APRV due to hypercarbia. Changed back to [facility] with F/U gas with slight improvement then again increased RR with slight improvement in pH but remained 7.0.  Now at max ventilatory support.  Increased RR to 35 and will recheck ABG again.  Bicarb drip started.  Additional CC time of 30 min.  Rush Farmer, M.D. [facility] Pulmonary/Critical Care Medicine. Pager: [phone]. After hours pager: [phone].

## 2013-04-29 NOTE — Procedures (Signed)
Arterial Catheter Insertion Procedure Note IDRISS QUACKENBUSH [id number] 05-04-1945  Procedure: Insertion of Arterial Catheter  Indications: Blood pressure monitoring and Frequent blood sampling  Procedure Details Consent: Risks of procedure as well as the alternatives and risks of each were explained to the (patient/caregiver).  Consent for procedure obtained. Time Out: Verified patient identification, verified procedure, site/side was marked, verified correct patient position, special equipment/implants available, medications/allergies/relevent history reviewed, required imaging and test results available.  Performed  Maximum sterile technique was used including antiseptics, cap, gloves, gown, hand hygiene, mask and sheet. Skin prep: Chlorhexidine; local anesthetic administered 20 gauge catheter was inserted into right radial artery using the Seldinger technique.  Evaluation Blood flow good; BP tracing good. Complications: No apparent complications.   [name] 04/29/2013

## 2013-04-29 NOTE — Progress Notes (Signed)
ANTICOAGULATION CONSULT NOTE - Follow Up Consult  Pharmacy Consult for Coumadin Indication: atrial fibrillation  No Known Allergies  Patient Measurements: Height: 6\' 4"  (193 cm) Weight: 194 lb 7.1 oz (88.2 kg) IBW/kg (Calculated) : 86.8  Vital Signs: Temp: 97.6 F (36.4 C) (02/01 0852) Temp src: Axillary (02/01 0852) BP: 124/62 mmHg (02/01 0852) Pulse Rate: 138 (02/01 0852)  Labs:  Recent Labs  04/27/13 0309 04/28/13 0318 04/29/13 0645  HGB 12.7* 12.1*  --   HCT 38.0* 37.5*  --   PLT 625* 467*  --   LABPROT 34.2* 37.8* 35.2*  INR 3.55* 4.05* 3.69*  CREATININE 0.82 0.83  --     Estimated Creatinine Clearance: 106 ml/min (by C-G formula based on Cr of 0.83).  Assessment:  45 yom continues on coumadin for hx afib. INR remains above goal at 3.69, but now trending down.  No bleeding noted. Home regimen for Coumadin was 2.5mg  and 5mg  alternating. Will not give coumadin today, but would consider a small dose tomorrow if INR continues to trend down.     Goal of Therapy:  INR 2-3   Plan:  1. No coumadin tonight 2. F/u AM INR  Thank you, Vivia Ewing, PharmD Clinical Pharmacist - Resident Pager: [phone] Pharmacy: [phone] 04/29/2013 9:46 AM

## 2013-04-29 NOTE — Procedures (Signed)
Central Venous Catheter Insertion Procedure Note Connor Ford [id number] Oct 15, 1945  Procedure: Insertion of Central Venous Catheter Indications: Assessment of intravascular volume  Procedure Details Consent: Risks of procedure as well as the alternatives and risks of each were explained to the (patient/caregiver).  Consent for procedure obtained. Time Out: Verified patient identification, verified procedure, site/side was marked, verified correct patient position, special equipment/implants available, medications/allergies/relevent history reviewed, required imaging and test results available.  Performed  Maximum sterile technique was used including antiseptics, cap, gloves, gown, hand hygiene, mask and sheet. Skin prep: Chlorhexidine; local anesthetic administered A antimicrobial bonded/coated triple lumen catheter was placed in the right internal jugular vein using the Seldinger technique.  Evaluation Blood flow good Complications: No apparent complications Patient did tolerate procedure well. Chest X-ray ordered to verify placement.  CXR: pending.  U/S used in placement.  Connor Ford 04/29/2013, 11:44 AM

## 2013-04-29 NOTE — Procedures (Signed)
Intubation Procedure Note Connor Ford [id number] 09-23-1945  Procedure: Intubation Indications: Respiratory insufficiency  Procedure Details Consent: Risks of procedure as well as the alternatives and risks of each were explained to the (patient/caregiver).  Consent for procedure obtained. Time Out: Verified patient identification, verified procedure, site/side was marked, verified correct patient position, special equipment/implants available, medications/allergies/relevent history reviewed, required imaging and test results available.  Performed  Maximum sterile technique was used including antiseptics, gloves, hand hygiene and mask.  MAC    Evaluation Hemodynamic Status: BP stable throughout; O2 sats: stable throughout Patient's Current Condition: stable Complications: No apparent complications Patient did tolerate procedure well. Chest X-ray ordered to verify placement.  CXR: pending.   Connor Ford 04/29/2013

## 2013-04-29 NOTE — Progress Notes (Signed)
[facility] Progress Note Patient Name: Connor Ford DOB: 1945/04/14 MRN: [id number]  Date of Service  04/29/2013   HPI/Events of Note   oliguria  eICU Interventions  Bolus NS    Intervention Category Intermediate Interventions: Oliguria - evaluation and management  Asencion Noble 04/29/2013, 6:18 PM

## 2013-04-29 NOTE — Progress Notes (Signed)
RN noted pt to be very SOB, with labored breathing and tachy on the monitor. Pt was placed back on BiPap at change of shift after attempting to switch back tot he non-rebreather mask unsuccessfully. His sats have not been able to come above 83%. RN tried to reposition the BiPap mask to prevent air leaks, but sats still did not elevate. RT was called to assess. MD notified by RT, breathing treatment was give by RT, ativan, metoprolol, and Solu-medrol   given by RN. Pt's sats currently 88%, still on Bipap.

## 2013-04-29 NOTE — Progress Notes (Signed)
Patient transferred to the ICU.  In critical condition.  Spoke with patient.  Wishes to be intubated.  Patient sedated and intubated.  Desaturating quickly.  Will heavily sedate and place on APRV with F/U ABG.  Will also place a central line and a-line for frequent ABG's.  Cardizem drip for HR control now that BP is better.  Will follow CVP and continue steroids.  Additional CC time of 45 min excluding procedures.  Rush Farmer, M.D. [facility] Pulmonary/Critical Care Medicine. Pager: [phone]. After hours pager: [phone].

## 2013-04-29 NOTE — Progress Notes (Signed)
[facility] Progress Note Patient Name: Connor Ford DOB: 08/16/1945 MRN: [id number]  Date of Service  04/29/2013   HPI/Events of Note   Needs SUP  eICU Interventions  PPI IV ordered   Intervention Category Intermediate Interventions: Best-practice therapies (e.g. DVT, beta blocker, etc.)  Asencion Noble 04/29/2013, 6:21 PM

## 2013-04-29 DEATH — deceased

## 2013-04-30 ENCOUNTER — Inpatient Hospital Stay (HOSPITAL_COMMUNITY): Payer: Medicare PPO

## 2013-04-30 DIAGNOSIS — R6520 Severe sepsis without septic shock: Secondary | ICD-10-CM

## 2013-04-30 DIAGNOSIS — R6521 Severe sepsis with septic shock: Secondary | ICD-10-CM

## 2013-04-30 DIAGNOSIS — A419 Sepsis, unspecified organism: Secondary | ICD-10-CM

## 2013-04-30 LAB — POCT I-STAT 3, ART BLOOD GAS (G3+)
Acid-Base Excess: 4 mmol/L — ABNORMAL HIGH (ref 0.0–2.0)
Acid-Base Excess: 6 mmol/L — ABNORMAL HIGH (ref 0.0–2.0)
Bicarbonate: 36.1 mEq/L — ABNORMAL HIGH (ref 20.0–24.0)
Bicarbonate: 36.1 mEq/L — ABNORMAL HIGH (ref 20.0–24.0)
O2 SAT: 99 %
O2 Saturation: 99 %
PCO2 ART: 76.6 mmHg — AB (ref 35.0–45.0)
Patient temperature: 97.4
Patient temperature: 97.4
TCO2: 38 mmol/L (ref 0–100)
TCO2: 39 mmol/L (ref 0–100)
pCO2 arterial: 93.1 mmHg (ref 35.0–45.0)
pH, Arterial: 7.193 — CL (ref 7.350–7.450)
pH, Arterial: 7.278 — ABNORMAL LOW (ref 7.350–7.450)
pO2, Arterial: 148 mmHg — ABNORMAL HIGH (ref 80.0–100.0)
pO2, Arterial: 170 mmHg — ABNORMAL HIGH (ref 80.0–100.0)

## 2013-04-30 LAB — RENAL FUNCTION PANEL
ALBUMIN: 1.8 g/dL — AB (ref 3.5–5.2)
BUN: 76 mg/dL — AB (ref 6–23)
CHLORIDE: 98 meq/L (ref 96–112)
CO2: 35 mEq/L — ABNORMAL HIGH (ref 19–32)
Calcium: 6.4 mg/dL — CL (ref 8.4–10.5)
Creatinine, Ser: 3.33 mg/dL — ABNORMAL HIGH (ref 0.50–1.35)
GFR calc Af Amer: 21 mL/min — ABNORMAL LOW (ref >90)
GFR calc non Af Amer: 18 mL/min — ABNORMAL LOW (ref >90)
GLUCOSE: 141 mg/dL — AB (ref 70–99)
Phosphorus: 10.5 mg/dL — ABNORMAL HIGH (ref 2.3–4.6)
Potassium: 6.2 mEq/L — ABNORMAL HIGH (ref 3.7–5.3)
Sodium: 145 mEq/L (ref 137–147)

## 2013-04-30 LAB — URINALYSIS, ROUTINE W REFLEX MICROSCOPIC
GLUCOSE, UA: 100 mg/dL — AB
Ketones, ur: 15 mg/dL — AB
Nitrite: NEGATIVE
PH: 5 (ref 5.0–8.0)
Protein, ur: 30 mg/dL — AB
SPECIFIC GRAVITY, URINE: 1.019 (ref 1.005–1.030)
Urobilinogen, UA: 4 mg/dL — ABNORMAL HIGH (ref 0.0–1.0)

## 2013-04-30 LAB — CBC
HCT: 36.8 % — ABNORMAL LOW (ref 39.0–52.0)
HEMOGLOBIN: 11.3 g/dL — AB (ref 13.0–17.0)
MCH: 30.6 pg (ref 26.0–34.0)
MCHC: 30.7 g/dL (ref 30.0–36.0)
MCV: 99.7 fL (ref 78.0–100.0)
Platelets: 302 10*3/uL (ref 150–400)
RBC: 3.69 MIL/uL — AB (ref 4.22–5.81)
RDW: 15.1 % (ref 11.5–15.5)
WBC: 53 10*3/uL (ref 4.0–10.5)

## 2013-04-30 LAB — BASIC METABOLIC PANEL
BUN: 60 mg/dL — ABNORMAL HIGH (ref 6–23)
CO2: 32 meq/L (ref 19–32)
CREATININE: 2.38 mg/dL — AB (ref 0.50–1.35)
Calcium: 6.4 mg/dL — CL (ref 8.4–10.5)
Chloride: 95 mEq/L — ABNORMAL LOW (ref 96–112)
GFR calc Af Amer: 31 mL/min — ABNORMAL LOW (ref >90)
GFR calc non Af Amer: 27 mL/min — ABNORMAL LOW (ref >90)
GLUCOSE: 278 mg/dL — AB (ref 70–99)
Potassium: 5.9 mEq/L — ABNORMAL HIGH (ref 3.7–5.3)
SODIUM: 143 meq/L (ref 137–147)

## 2013-04-30 LAB — GLUCOSE, CAPILLARY
Glucose-Capillary: 228 mg/dL — ABNORMAL HIGH (ref 70–99)
Glucose-Capillary: 197 mg/dL — ABNORMAL HIGH (ref 70–99)
Glucose-Capillary: 134 mg/dL — ABNORMAL HIGH (ref 70–99)

## 2013-04-30 LAB — URINE MICROSCOPIC-ADD ON

## 2013-04-30 LAB — SODIUM, URINE, RANDOM: Sodium, Ur: 20 mEq/L

## 2013-04-30 LAB — PROCALCITONIN: Procalcitonin: 8.47 ng/mL

## 2013-04-30 LAB — CREATININE, URINE, RANDOM: Creatinine, Urine: 94.74 mg/dL

## 2013-04-30 LAB — PROTIME-INR
INR: 4.66 — ABNORMAL HIGH (ref 0.00–1.49)
PROTHROMBIN TIME: 42.1 s — AB (ref 11.6–15.2)

## 2013-04-30 LAB — TROPONIN I
TROPONIN I: 1.9 ng/mL — AB (ref <0.30)
TROPONIN I: 1.65 ng/mL — AB (ref <0.30)
TROPONIN I: 1.2 ng/mL — AB (ref <0.30)

## 2013-04-30 MED ORDER — INSULIN ASPART 100 UNIT/ML ~~LOC~~ SOLN
8.0000 [IU] | Freq: Once | SUBCUTANEOUS | Status: AC
  Administered 2013-04-30: 8 [IU] via INTRAVENOUS

## 2013-04-30 MED ORDER — ACETAMINOPHEN 325 MG PO TABS: 650.0000 mg | ORAL_TABLET | ORAL | Status: DC | PRN

## 2013-04-30 MED ORDER — METHYLPREDNISOLONE SODIUM SUCC 125 MG IJ SOLR: 80.0000 mg | Freq: Two times a day (BID) | INTRAMUSCULAR | Status: DC

## 2013-04-30 MED ORDER — INSULIN GLARGINE 100 UNIT/ML ~~LOC~~ SOLN
15.0000 [IU] | Freq: Every day | SUBCUTANEOUS | Status: DC
  Administered 2013-04-30 – 2013-05-01 (×2): 15 [IU] via SUBCUTANEOUS
  Filled 2013-04-30 (×2): qty 0.15

## 2013-04-30 MED ORDER — PRISMASOL BGK 4/2.5 32-4-2.5 MEQ/L IV SOLN
INTRAVENOUS | Status: DC
  Administered 2013-04-30 – 2013-05-02 (×11): via INTRAVENOUS_CENTRAL
  Filled 2013-04-30 (×19): qty 5000

## 2013-04-30 MED ORDER — HEPARIN SODIUM (PORCINE) 1000 UNIT/ML IJ SOLN
3000.0000 [IU] | Freq: Once | INTRAMUSCULAR | Status: AC
  Administered 2013-04-30: 2800 [IU] via INTRAVENOUS
  Filled 2013-04-30: qty 3

## 2013-04-30 MED ORDER — PIPERACILLIN-TAZOBACTAM 3.375 G IVPB 30 MIN
3.3750 g | Freq: Once | INTRAVENOUS | Status: AC
  Administered 2013-04-30: 3.375 g via INTRAVENOUS
  Filled 2013-04-30: qty 50

## 2013-04-30 MED ORDER — VANCOMYCIN HCL 10 G IV SOLR
1750.0000 mg | Freq: Once | INTRAVENOUS | Status: AC
  Administered 2013-04-30: 1750 mg via INTRAVENOUS
  Filled 2013-04-30: qty 1750

## 2013-04-30 MED ORDER — HEPARIN (PORCINE) 2000 UNITS/L FOR CRRT
INTRAVENOUS_CENTRAL | Status: DC | PRN
  Filled 2013-04-30: qty 1000

## 2013-04-30 MED ORDER — VITAL HIGH PROTEIN PO LIQD
1000.0000 mL | ORAL | Status: DC
  Administered 2013-04-30: 1000 mL
  Filled 2013-04-30 (×3): qty 1000

## 2013-04-30 MED ORDER — HEPARIN (PORCINE) IN NACL 100-0.45 UNIT/ML-% IJ SOLN: 500.0000 [IU]/h | INTRAMUSCULAR | Status: DC

## 2013-04-30 MED ORDER — PIPERACILLIN-TAZOBACTAM 3.375 G IVPB 30 MIN
3.3750 g | Freq: Four times a day (QID) | INTRAVENOUS | Status: DC
  Administered 2013-05-01 – 2013-05-02 (×6): 3.375 g via INTRAVENOUS
  Filled 2013-04-30 (×8): qty 50

## 2013-04-30 MED ORDER — PRISMASOL BGK 4/2.5 32-4-2.5 MEQ/L IV SOLN
INTRAVENOUS | Status: DC
Start: 2013-04-30 — End: 2013-05-02
  Administered 2013-04-30 – 2013-05-01 (×3): via INTRAVENOUS_CENTRAL
  Filled 2013-04-30 (×7): qty 5000

## 2013-04-30 MED ORDER — METOPROLOL TARTRATE 1 MG/ML IV SOLN: 2.5000 mg | INTRAVENOUS | Status: DC | PRN

## 2013-04-30 MED ORDER — SODIUM CHLORIDE 0.9 % IV BOLUS (SEPSIS)
500.0000 mL | Freq: Once | INTRAVENOUS | Status: AC
  Administered 2013-04-30: 500 mL via INTRAVENOUS

## 2013-04-30 MED ORDER — SODIUM CHLORIDE 0.9 % IJ SOLN
500.0000 [IU]/h | INTRAMUSCULAR | Status: DC
  Administered 2013-04-30: 500 [IU]/h via INTRAVENOUS_CENTRAL
  Filled 2013-04-30 (×6): qty 1

## 2013-04-30 MED ORDER — INSULIN ASPART 100 UNIT/ML ~~LOC~~ SOLN
0.0000 [IU] | SUBCUTANEOUS | Status: DC
  Administered 2013-04-30: 3 [IU] via SUBCUTANEOUS
  Administered 2013-04-30 – 2013-05-01 (×2): 2 [IU] via SUBCUTANEOUS
  Administered 2013-05-01 (×2): 3 [IU] via SUBCUTANEOUS
  Administered 2013-05-02: 2 [IU] via SUBCUTANEOUS

## 2013-04-30 MED ORDER — SODIUM CHLORIDE 0.9 % IJ SOLN: 500.0000 [IU]/h | INTRAMUSCULAR | Status: DC

## 2013-04-30 MED ORDER — HYDROCORTISONE NA SUCCINATE PF 100 MG IJ SOLR
50.0000 mg | Freq: Four times a day (QID) | INTRAMUSCULAR | Status: DC
  Administered 2013-04-30 – 2013-05-02 (×8): 50 mg via INTRAVENOUS
  Filled 2013-04-30 (×12): qty 1

## 2013-04-30 MED ORDER — SODIUM POLYSTYRENE SULFONATE 15 GM/60ML PO SUSP
60.0000 g | Freq: Once | ORAL | Status: AC
  Administered 2013-04-30: 60 g
  Filled 2013-04-30: qty 240

## 2013-04-30 MED ORDER — PRISMASOL BGK 4/2.5 32-4-2.5 MEQ/L IV SOLN
INTRAVENOUS | Status: DC
  Administered 2013-04-30 – 2013-05-02 (×3): via INTRAVENOUS_CENTRAL
  Filled 2013-04-30 (×8): qty 5000

## 2013-04-30 MED ORDER — HEPARIN SODIUM (PORCINE) 1000 UNIT/ML DIALYSIS
1000.0000 [IU] | INTRAMUSCULAR | Status: DC | PRN
  Filled 2013-04-30 (×3): qty 6

## 2013-04-30 MED ORDER — SODIUM CHLORIDE 0.9 % IV SOLN
INTRAVENOUS | Status: DC
  Administered 2013-04-30 – 2013-05-02 (×3): via INTRAVENOUS
  Filled 2013-04-30 (×6): qty 18

## 2013-04-30 MED ORDER — ASPIRIN 81 MG PO CHEW
81.0000 mg | CHEWABLE_TABLET | Freq: Every day | ORAL | Status: DC
  Administered 2013-04-30 – 2013-05-01 (×2): 81 mg
  Filled 2013-04-30 (×2): qty 1

## 2013-04-30 MED ORDER — VANCOMYCIN HCL IN DEXTROSE 1-5 GM/200ML-% IV SOLN
1000.0000 mg | INTRAVENOUS | Status: DC
  Administered 2013-05-01 – 2013-05-02 (×2): 1000 mg via INTRAVENOUS
  Filled 2013-04-30 (×2): qty 200

## 2013-04-30 MED ORDER — SODIUM POLYSTYRENE SULFONATE 15 GM/60ML PO SUSP
15.0000 g | Freq: Once | ORAL | Status: AC
  Administered 2013-04-30: 15 g
  Filled 2013-04-30: qty 60

## 2013-04-30 MED ORDER — LEVALBUTEROL HCL 0.63 MG/3ML IN NEBU
0.6300 mg | INHALATION_SOLUTION | Freq: Four times a day (QID) | RESPIRATORY_TRACT | Status: DC
  Administered 2013-04-30 – 2013-05-02 (×8): 0.63 mg via RESPIRATORY_TRACT
  Filled 2013-04-30 (×16): qty 3

## 2013-04-30 MED ORDER — SODIUM CHLORIDE 0.9 % IV SOLN
1.0000 g | Freq: Once | INTRAVENOUS | Status: AC
  Administered 2013-04-30: 1 g via INTRAVENOUS
  Filled 2013-04-30: qty 10

## 2013-04-30 MED ORDER — PIPERACILLIN-TAZOBACTAM 3.375 G IVPB
3.3750 g | Freq: Three times a day (TID) | INTRAVENOUS | Status: DC
  Administered 2013-04-30: 3.375 g via INTRAVENOUS
  Filled 2013-04-30 (×3): qty 50

## 2013-04-30 MED ORDER — INSULIN ASPART 100 UNIT/ML ~~LOC~~ SOLN
2.0000 [IU] | SUBCUTANEOUS | Status: DC
  Administered 2013-04-30: 6 [IU] via SUBCUTANEOUS

## 2013-04-30 NOTE — Progress Notes (Signed)
PT Cancellation Note/ Discharge  Patient Details Name: Connor Ford MRN: [id number] DOB: Feb 28, 1946   Cancelled Treatment:    Reason Eval/Treat Not Completed: Medical issues which prohibited therapy (pt on vent, INR 4.66, K 5.9) Pt with change in status and will sign off and await new orders with medical stability. Thanks.   [name] [name] 04/30/2013, 7:28 AM [name], [place]

## 2013-04-30 NOTE — Progress Notes (Signed)
Dr. Chase Caller notified of troponin level

## 2013-04-30 NOTE — Progress Notes (Signed)
Lab review rounds  PULMONARY  Recent Labs Lab 04/29/13 0948 04/29/13 1619 04/30/13 0054 04/30/13 0629  PHART 7.440 7.027* 7.193* 7.278*  PCO2ART 49.5* 121.9* 93.1* 76.6*  PO2ART 54.2* 101.0* 170.0* 148.0*  HCO3 33.1* 32.3* 36.1* 36.1*  TCO2 34.6 36 39 38  O2SAT 86.7 93.0 99.0 99.0    CBC  Recent Labs Lab 04/27/13 0309 04/28/13 0318 04/30/13 0430  HGB 12.7* 12.1* 11.3*  HCT 38.0* 37.5* 36.8*  WBC 22.3* 27.4* 53.0*  PLT 625* 467* 302    COAGULATION  Recent Labs Lab 04/26/13 0329 04/27/13 0309 04/28/13 0318 04/29/13 0645 04/30/13 0400  INR 3.07* 3.55* 4.05* 3.69* 4.66*    CARDIAC   Recent Labs Lab 04/30/13 1141 04/30/13 1658  TROPONINI 1.90* 1.65*   No results found for this basename: PROBNP,  in the last 168 hours   CHEMISTRY  Recent Labs Lab 04/24/13 0353  04/26/13 0329 04/27/13 0309 04/28/13 0318 04/30/13 0430 04/30/13 1700  NA 138  < > 139 141 140 143 145  K 3.7  < > 5.0 4.3 4.6 5.9* 6.2*  CL 97  < > 101 99 99 95* 98  CO2 28  < > 27 29 32 32 35*  GLUCOSE 141*  < > 152* 182* 210* 278* 141*  BUN 26*  < > 36* 34* 33* 60* 76*  CREATININE 0.81  < > 0.86 0.82 0.83 2.38* 3.33*  CALCIUM 8.1*  < > 7.8* 8.0* 7.9* 6.4* 6.4*  MG 2.1  --  2.1 2.2 2.2  --   --   PHOS 4.5  --  3.6 3.9 4.3  --  10.5*  < > = values in this interval not displayed. Estimated Creatinine Clearance: 26.4 ml/min (by C-G formula based on Cr of 3.33).  INFECTIOUS  Recent Labs Lab 04/30/13 1141  PROCALCITON 8.47   A [place] renal failure wit hyperkalemia with consideration for HD STress Type 2 NSTEMI - IRN 4s and on aspirin; so no further Rx.  Coagulopathy  INRs 4  P D/w bedside RN- She spoke to renal and they will start CRRT now   Dr. Brand Males, M.D., [facility].C.P Pulmonary and Critical Care Medicine Staff Physician [place] [facility]monary and Critical Care Pager: [phone], If no answer or between  15:00h - 7:00h: call [phone]  04/30/2013 7:08 PM

## 2013-04-30 NOTE — Progress Notes (Signed)
ANTIBIOTIC CONSULT NOTE - FOLLOW UP  Pharmacy Consult for vancomycin and Zosyn Indication: septic shock  No Known Allergies  Patient Measurements: Height: 6\' 4"  (193 cm) Weight: 194 lb 7.1 oz (88.2 kg) IBW/kg (Calculated) : 86.8   Vital Signs: Temp: 97.9 F (36.6 C) (02/02 2000) Temp src: Oral (02/02 2000) BP: 108/44 mmHg (02/02 2015) Pulse Rate: 90 (02/02 2100) Intake/Output from previous day: 02/01 0701 - 02/02 0700 In: 5097 [I.V.:3897; NG/GT:90; IV Piggyback:1110] Out: 380 [Urine:280; Emesis/NG output:100] Intake/Output from this shift: Total I/O In: 180.7 [I.V.:120.7; NG/GT:60] Out: 10 [Urine:10]  Labs:  Recent Labs  04/28/13 0318 04/30/13 0313 04/30/13 0430 04/30/13 1700  WBC 27.4*  --  53.0*  --   HGB 12.1*  --  11.3*  --   PLT 467*  --  302  --   LABCREA  --  94.74  --   --   CREATININE 0.83  --  2.38* 3.33*   Estimated Creatinine Clearance: 26.4 ml/min (by C-G formula based on Cr of 3.33). No results found for this basename: VANCOTROUGH, Corlis Leak, VANCORANDOM, GENTTROUGH, GENTPEAK, GENTRANDOM, [place], TOBRAPEAK, TOBRARND, AMIKACINPEAK, AMIKACINTROU, AMIKACIN,  in the last 72 hours   Microbiology: Recent Results (from the past 720 hour(s))  URINE CULTURE     Status: None   Collection Time    03/30/2013  7:21 PM      Result Value Range Status   Specimen Description URINE, RANDOM   Final   Special Requests NONE   Final   Culture  Setup Time     Final   Value: 04/15/2013 21:25     Performed at [place]     Final   Value: NO GROWTH     Performed at [facility]   Culture     Final   Value: NO GROWTH     Performed at [facility]   Report Status 04/14/2013 FINAL   Final  CULTURE, BLOOD (ROUTINE X 2)     Status: None   Collection Time    04/14/2013  7:35 PM      Result Value Range Status   Specimen Description BLOOD ARM RIGHT   Final   Special Requests BOTTLES DRAWN AEROBIC AND ANAEROBIC 10CC   Final   Culture  Setup Time     Final   Value: 04/13/2013 02:23     Performed at [facility]   Culture     Final   Value: NO GROWTH 5 DAYS     Performed at [facility]   Report Status 04/19/2013 FINAL   Final  CULTURE, BLOOD (ROUTINE X 2)     Status: None   Collection Time    04/17/2013  7:40 PM      Result Value Range Status   Specimen Description BLOOD HAND LEFT   Final   Special Requests BOTTLES DRAWN AEROBIC ONLY 10CC   Final   Culture  Setup Time     Final   Value: 04/13/2013 02:24     Performed at [facility]   Culture     Final   Value: NO GROWTH 5 DAYS     Performed at [facility]   Report Status 04/19/2013 FINAL   Final  RESPIRATORY VIRUS PANEL     Status: None   Collection Time    04/26/2013 10:08 PM      Result Value Range Status   Source - RVPAN NASAL SWAB   Corrected  Comment: CORRECTED ON 01/16 AT 2216: PREVIOUSLY REPORTED AS NASAL SWAB   Respiratory Syncytial Virus A NOT DETECTED   Final   Respiratory Syncytial Virus B NOT DETECTED   Final   Influenza A NOT DETECTED   Final   Influenza B NOT DETECTED   Final   Parainfluenza 1 NOT DETECTED   Final   Parainfluenza 2 NOT DETECTED   Final   Parainfluenza 3 NOT DETECTED   Final   Metapneumovirus NOT DETECTED   Final   Rhinovirus NOT DETECTED   Final   Adenovirus NOT DETECTED   Final   Influenza A H1 NOT DETECTED   Final   Influenza A H3 NOT DETECTED   Final   Comment: (NOTE)           Normal Reference Range for each Analyte: NOT DETECTED     Testing performed using the Luminex xTAG Respiratory Viral Panel test     kit.     This test was developed and its performance characteristics determined     by [facility]. It has not been cleared or approved by the [place]     Food and Drug Administration. This test is used for clinical purposes.     It should not be regarded as investigational or for research. This     laboratory is certified under the [place] (CLIA) as qualified to perform high complexity     clinical laboratory testing.     Performed at [place], RESPIRATORY (NON-EXPECTORATED)     Status: None   Collection Time    04/14/13 11:52 PM      Result Value Range Status   Specimen Description TRACHEAL ASPIRATE   Final   Special Requests NONE   Final   Gram Stain     Final   Value: FEW WBC PRESENT,BOTH PMN AND MONONUCLEAR     NO SQUAMOUS EPITHELIAL CELLS SEEN     NO ORGANISMS SEEN     Performed at [facility]   Culture     Final   Value: FEW CANDIDA ALBICANS     Performed at [facility]   Report Status 04/17/2013 FINAL   Final  RESPIRATORY VIRUS PANEL     Status: Abnormal   Collection Time    04/15/13  1:00 PM      Result Value Range Status   Source - RVPAN TRACHEAL ASPIRATE   Final   Respiratory Syncytial Virus A NOT DETECTED   Final   Respiratory Syncytial Virus B NOT DETECTED   Final   Influenza A DETECTED (*)  Final   Influenza B NOT DETECTED   Final   Parainfluenza 1 NOT DETECTED   Final   Parainfluenza 2 NOT DETECTED   Final   Parainfluenza 3 NOT DETECTED   Final   Metapneumovirus NOT DETECTED   Final   Rhinovirus NOT DETECTED   Final   Adenovirus NOT DETECTED   Final   Influenza A H1 NOT DETECTED   Final   Influenza A H3 DETECTED (*)  Final   Comment: (NOTE)           Normal Reference Range for each Analyte: NOT DETECTED     Testing performed using the Luminex xTAG Respiratory Viral Panel test     kit.     This test was developed and its performance characteristics determined     by [facility]. It  has not been cleared or approved by the [place]     Food and Drug Administration. This test is used for clinical purposes.     It should not be regarded as investigational or for research. This     laboratory is certified under the [place] (CLIA) as qualified to perform high complexity     clinical laboratory  testing.     Performed at [place]     Status: None   Collection Time    04/16/13  6:10 AM      Result Value Range Status   Specimen Description URINE, CATHETERIZED   Final   Special Requests ADDED [phone]   Final   Culture  Setup Time     Final   Value: 04/16/2013 07:16     Performed at [place] [facility]     Final   Value: NO GROWTH     Performed at [facility]   Culture     Final   Value: NO GROWTH     Performed at [facility]   Report Status 04/17/2013 FINAL   Final  CULTURE, BLOOD (ROUTINE X 2)     Status: None   Collection Time    04/16/13  7:00 AM      Result Value Range Status   Specimen Description BLOOD LEFT ARM   Final   Special Requests BOTTLES DRAWN AEROBIC AND ANAEROBIC 10CC   Final   Culture  Setup Time     Final   Value: 04/16/2013 12:50     Performed at [facility]   Culture     Final   Value: NO GROWTH 5 DAYS     Performed at [facility]   Report Status 04/22/2013 FINAL   Final  CULTURE, BLOOD (ROUTINE X 2)     Status: None   Collection Time    04/16/13  7:10 AM      Result Value Range Status   Specimen Description BLOOD LEFT FOREARM   Final   Special Requests     Final   Value: BOTTLES DRAWN AEROBIC AND ANAEROBIC AERO 10CC ANA 5CC   Culture  Setup Time     Final   Value: 04/16/2013 12:50     Performed at [facility]   Culture     Final   Value: NO GROWTH 5 DAYS     Performed at [facility]   Report Status 04/22/2013 FINAL   Final    Anti-infectives   Start     Dose/Rate Route Frequency Ordered Stop   05/01/13 1200  vancomycin (VANCOCIN) IVPB 1000 mg/200 mL premix     1,000 mg 200 mL/hr over 60 Minutes Intravenous Every 24 hours 04/30/13 2246     05/01/13 0000  piperacillin-tazobactam (ZOSYN) IVPB 3.375 g     3.375 g 100 mL/hr over 30 Minutes Intravenous 4 times per day 04/30/13 2246     04/30/13 1800  piperacillin-tazobactam (ZOSYN) IVPB 3.375 g   Status:  Discontinued     3.375 g 12.5 mL/hr over 240 Minutes Intravenous Every 8 hours 04/30/13 1101 04/30/13 2246   04/30/13 1200  vancomycin (VANCOCIN) 1,750 mg in sodium chloride 0.9 % 500 mL IVPB     1,750 mg 250 mL/hr over 120 Minutes Intravenous  Once 04/30/13 1101 04/30/13 1617   04/30/13 1200  piperacillin-tazobactam (ZOSYN) IVPB 3.375 g  3.375 g 100 mL/hr over 30 Minutes Intravenous  Once 04/30/13 1101 04/30/13 1449   04/20/13 1200  levofloxacin (LEVAQUIN) tablet 750 mg     750 mg Oral Daily 04/20/13 0946 04/23/13 1039   04/18/13 0200  vancomycin (VANCOCIN) IVPB 1000 mg/200 mL premix  Status:  Discontinued     1,000 mg 200 mL/hr over 60 Minutes Intravenous Every 8 hours 04/17/13 2056 04/18/13 1057   04/17/13 1000  cefTRIAXone (ROCEPHIN) 1 g in dextrose 5 % 50 mL IVPB  Status:  Discontinued     1 g 100 mL/hr over 30 Minutes Intravenous Every 24 hours 04/17/13 0919 04/20/13 0946   04/16/13 2200  oseltamivir (TAMIFLU) capsule 75 mg  Status:  Discontinued     75 mg Oral 2 times daily 04/16/13 1442 04/17/13 0917   04/15/13 1830  vancomycin (VANCOCIN) IVPB 1000 mg/200 mL premix  Status:  Discontinued     1,000 mg 200 mL/hr over 60 Minutes Intravenous Every 12 hours 04/15/13 1824 04/17/13 2056   04/13/13 2230  piperacillin-tazobactam (ZOSYN) IVPB 3.375 g  Status:  Discontinued     3.375 g 12.5 mL/hr over 240 Minutes Intravenous 3 times per day 04/13/13 2225 04/17/13 0919   04/13/13 2200  oseltamivir (TAMIFLU) capsule 75 mg  Status:  Discontinued     75 mg Oral 2 times daily 04/13/13 1620 04/13/13 1632   04/13/13 2000  cefTRIAXone (ROCEPHIN) 1 g in dextrose 5 % 50 mL IVPB  Status:  Discontinued     1 g 100 mL/hr over 30 Minutes Intravenous Every 24 hours 04/04/2013 2114 04/13/13 2225   04/13/13 1800  vancomycin (VANCOCIN) IVPB 750 mg/150 ml premix  Status:  Discontinued     750 mg 150 mL/hr over 60 Minutes Intravenous Every 12 hours 04/13/13 1651 04/15/13 1824   04/13/13 1730   oseltamivir (TAMIFLU) capsule 30 mg  Status:  Discontinued     30 mg Oral 2 times daily 04/13/13 1635 04/16/13 1442   04/13/13 1700  levofloxacin (LEVAQUIN) IVPB 750 mg  Status:  Discontinued     750 mg 100 mL/hr over 90 Minutes Intravenous Every 24 hours 04/13/13 1651 04/13/13 2225   03/30/2013 2200  azithromycin (ZITHROMAX) tablet 500 mg  Status:  Discontinued     500 mg Oral Every 24 hours 04/27/2013 2114 04/13/13 1626   04/23/2013 1930  cefTRIAXone (ROCEPHIN) 1 g in dextrose 5 % 50 mL IVPB     1 g 100 mL/hr over 30 Minutes Intravenous  Once 04/20/2013 1915 04/01/2013 2032   04/17/2013 1930  azithromycin (ZITHROMAX) 500 mg in dextrose 5 % 250 mL IVPB  Status:  Discontinued     500 mg 250 mL/hr over 60 Minutes Intravenous  Once 04/28/2013 1915 04/14/2013 2114      Assessment: 67 YOM on vancomycin and Zosyn with continuously worsening renal function now to start CRRT. Antibiotics need to be adjusted with this new therapy.  Goal of Therapy:  Vancomycin trough level 15-20 mcg/ml  Plan:  1. Change Zosyn to 3.375gm IV q6h 2. Vancomycin $RemoveBefor'1000mg'xUmeoSnrrRND$  IV q24h (~$RemoveB'10mg'OzlRodIz$ /kg q24h) starting tomorrow at 1200 3. Follow CRRT progress and renal plans and adjust as needed  Lendell Gallick D. Ebrahim Deremer, PharmD, BCPS Clinical Pharmacist Pager: [phone] 04/30/2013 10:48 PM

## 2013-04-30 NOTE — Progress Notes (Addendum)
PULMONARY / CRITICAL CARE MEDICINE  Name: Connor Ford MRN: [id number] DOB: March 01, 1946    ADMISSION DATE:  04/13/2013 CONSULTATION DATE:  04/13/2013  REFERRING MD :  [facility] PRIMARY SERVICE:  PCCM  CHIEF COMPLAINT:  Acute respiratory failure  BRIEF PATIENT DESCRIPTION: 68 y/o w/ PMHx of systolic CHF and sleep apnea admitted on 1/15 with dyspnea, fever, and pulmonary infiltrates. On 1/16, patient developed acute respiratory distress and CXR demonstrated worsening bilateral airspace disease.    SIGNIFICANT EVENTS / STUDIES:  1/15 Admitted for pneumonia 1/16 Transferred to ICU/Intubated 1/17 Abd [place]: mild distention of GB. O/W normal 1/19 Extubated 1/23 Transfer to SDU 1/26 Unable to maintain sats on 100%. NPPV resumed 1/26 1/31 persistent hypoxemia. Intermittent NPPV 2/01 transfer to ICU for refractory hypoxemia. Re-intubated 2/01 Refractory hypotension, vasopressors started 2/02 oliguric renal failure. Renal consult. Suspected severe sepsis/shock. Cultures obtained. Empiric abx started.  2/02 Renal US:   LINES / TUBES: ETT 1/16 >> 1/19 CVL 1/16 >> 1/22 R. Radial A-line 1/16 >> 1/20 ETT 2/01 >>  R IJ CVL 2/01 >>  R radial A-line 2/01 >>   CULTURES: 1/15 Blood >>>neg 1/15 Urine >>>no growth 1/15 Resp virus panel nasal swab >>> neg 1/17 Resp >>>few candida, negative 1/18 Resp virus panel trach aspir >>> Flu A positive 1/19 Blood >>>neg Urine 2/02 >>  Resp 2/02 >>  Blood 2/02 >>    ANTIBIOTICS: Ceftriaxone 1/15 >>> 1/16 Azithromycin 1/15 >>> 1/16  Levaquin 1/16 >>> 1/16 Tamiflu 1/16 >>> 1/19 Vancomycin 1/16 >>> 1/21 Zosyn 1/16 >>> 1/20 Rocephin 1/20 >>>1/23 Levaquin 1/23 >>>1/26 Vanc 2/01 >> Pip-tazo 2/01 >>    SUBJ: RASS -3, not F/C   VITAL SIGNS: Temp:  [97.4 F (36.3 C)-98.6 F (37 C)] 97.6 F (36.4 C) (02/02 0900) Pulse Rate:  [39-133] 93 (02/02 0826) Resp:  [15-39] 39 (02/02 0826) BP: (98-187)/(44-93) 124/55 mmHg (02/02 0826) SpO2:  [82 %-100 %] 94  % (02/02 0826) Arterial Line BP: (78-201)/(40-90) 102/49 mmHg (02/02 0600) FiO2 (%):  [60 %-100 %] 60 % (02/02 0826)   INTAKE / OUTPUT: Intake/Output     02/01 0701 - 02/02 0700 02/02 0701 - 02/03 0700   P.O.     I.V. (mL/kg) 3897 (44.2)    NG/GT 90    IV Piggyback 1110    Total Intake(mL/kg) 5097 (57.8)    Urine (mL/kg/hr) 280 (0.1)    Emesis/NG output 100 (0)    Total Output 380     Net +4717           PHYSICAL EXAMINATION: General:  RASS -3, not F/C Neuro:  Sedated, MAEs HEENT: WNL Cardiovascular: RRR, soft systolic M Lungs: bilat dependent crackles Abdomen:  Soft, NT, +BS Musculoskeletal: No edema. Chronic venous stasis changes in LE's.  LABS: I have reviewed all of today's lab results. Relevant abnormalities are discussed in the A/P section   ASSESSMENT / PLAN:  PULMONARY A:   Acute hypoxemic respiratory failure Permissive hypercarbia Recent influenza A ARDS, recurrent - on steroids for late phase ARDS vs amio tox R/O pulmonary edema  P:   Vent changes made Cont vent bundle Daily SBT as indicated Change BDs to scheduled DC methyl pred - stress dose HC started 2/02  CARDIOVASCULAR A:  H/O CHF with preserved LVEF Mod mitral stenosis by Echo Pulm HTN by Echo CAF, rate controlled Shock - presumed septic P:  Titrate NE to maintain MAP > 65 mmHg Cont PRN metoprolol to maintain HR < 115/min Check BNP and trop  I 2/02 Change methyl pred to hydrocort  GASTROINTESTINAL A:   Elevated transaminases, alk phos - suspect hepatic congestion P:   SUP: IV PPI Begin TFs 2/02  INFECTIOUS A:   Influenza A, resolved CAP, resolved Suspected HCAP Severe leukocytosis (on steroids) Suspected severe sepsis P:   Micro and abx as above  ENDO A:  Hyperglycemia due to steroids/stress No prior hx of DM P:  Begin Lantus 2/02 Change to SSI - mod scale 2/02  NEURO: A:  ICU/vent associated discomfort P:  Cont fentanyl infusion Daily WUA   CCM X 50  mins   Merton Border, MD ; [facility] service Mobile [phone].  After 5:30 PM or weekends, call [phone]

## 2013-04-30 NOTE — Progress Notes (Signed)
CRITICAL VALUE ALERT  Critical value received:  WBC 53.0  Date of notification:  04-30-13  Time of notification:  0512  Critical value read back:yes  Nurse who received alert:  Ciro Backer, RN  MD notified (1st page):  Elink  Time of first page:  [phone]  Responding MD:  Elsworth Soho  Time MD responded:  [phone]

## 2013-04-30 NOTE — Progress Notes (Signed)
ANTIBIOTIC CONSULT NOTE - INITIAL  Pharmacy Consult for vancomycin/zosyn; coumadin Indication: septic shock, afib  No Known Allergies  Patient Measurements: Height: 6\' 4"  (193 cm) Weight: 194 lb 7.1 oz (88.2 kg) IBW/kg (Calculated) : 86.8   Vital Signs: Temp: 97.6 F (36.4 C) (02/02 0900) Temp src: Oral (02/02 0900) BP: 124/55 mmHg (02/02 0826) Pulse Rate: 93 (02/02 0826) Intake/Output from previous day: 02/01 0701 - 02/02 0700 In: 5097 [I.V.:3897; NG/GT:90; IV Piggyback:1110] Out: 380 [Urine:280; Emesis/NG output:100] Intake/Output from this shift:    Labs:  Recent Labs  04/28/13 0318 04/30/13 0313 04/30/13 0430  WBC 27.4*  --  53.0*  HGB 12.1*  --  11.3*  PLT 467*  --  302  LABCREA  --  94.74  --   CREATININE 0.83  --  2.38*   Estimated Creatinine Clearance: 37 ml/min (by C-G formula based on Cr of 2.38). No results found for this basename: VANCOTROUGH, VANCOPEAK, VANCORANDOM, GENTTROUGH, GENTPEAK, GENTRANDOM, TOBRATROUGH, TOBRAPEAK, TOBRARND, AMIKACINPEAK, AMIKACINTROU, AMIKACIN,  in the last 72 hours   Medications:  Scheduled:  . antiseptic oral rinse  15 mL Mouth Rinse QID  . aspirin  81 mg Per Tube QHS  . chlorhexidine  15 mL Mouth Rinse BID  . insulin aspart  0-15 Units Subcutaneous Q4H  . insulin glargine  15 Units Subcutaneous Daily  . levalbuterol  0.63 mg Nebulization Q6H  . methylPREDNISolone (SOLU-MEDROL) injection  80 mg Intravenous Q12H  . pantoprazole (PROTONIX) IV  40 mg Intravenous Q24H  . sodium polystyrene  60 g Per Tube Once  . Warfarin - Pharmacist Dosing Inpatient   Does not apply q1800    Assessment: 68 yo male now with VDRF to begin antibiotics for septic shock (patient recetn;y completed a 10 days antibiotic course for PNA).  WBC= 53, afeb, SCr= 2.38 and CrCl ~ 35.  Last SCr was 0.83 noted on 04/28/13.  Patient also on coumadin for afib and INR is 4.66 with trend up. Last coumadin dose was 04/26/12. Hg= 11/3, plt= 302.  2/2  vancomycin>> 2/2 zosyn>>  2/2 blood x2 2/2 resp   Goal of Therapy:  Vancomycin trough level 15-20 mcg/ml  Plan:  -Vancomycin 1750mg  IV x1 then will follow SCr trend in am for maintenance -Hold coumadin today Hildred Laser, Pharm D 04/30/2013 10:59 AM

## 2013-04-30 NOTE — Progress Notes (Signed)
CRITICAL VALUE ALERT  Critical value received:  Ca 6.4  Date of notification:  04-30-13  Time of notification:  0555  Critical value read back:yes  Nurse who received alert:  Ciro Backer, RN  MD notified (1st page):  Elink  Time of first page:  0555  MD notified (2nd page):  Time of second page:  Responding MD: Elsworth Soho, MD  Time MD responded:  [phone]

## 2013-04-30 NOTE — Consult Note (Signed)
Connor Ford 04/30/2013 Pearson Grippe, B Requesting Physician:  Alva Garnet  Reason for Consult:  AKI, oliguria, hyperkalemia HPI:  56M with hx/o PVD, HTN, systolic HF and OSA admited 04/26/2013 with ARDS, influenza, slowly recovered and extubated but had persistent hypoxia.  Had worsening hypoxia over weekend, hypotension req pressors, and increase SCr with reduced UOP.  Has had difficulty with ventilation after recent intubation  Creatinine, Ser (mg/dL)  Date Value  04/30/2013 2.38*  04/28/2013 0.83   04/27/2013 0.82   04/26/2013 0.86   04/25/2013 0.95   04/24/2013 0.81   04/23/2013 0.92   04/21/2013 0.83   04/19/2013 0.77   04/18/2013 0.82   ] I/Os: I/O last 3 completed shifts: In: 5197 [P.O.:100; I.V.:3897; NG/GT:90; IV Piggyback:1110] Out: 0865 [Urine:1155; Emesis/NG output:100]  ROS: unable to directly inquire from pt.  NSAIDS: no exposure identified IV Contrast no exposure identified TMP/SMX no exposure identified Hypotension: present currently Balance of 12 systems is negative w/ exceptions as above  PMH  Past Medical History  Diagnosis Date  . 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 Last echo 03/2007 with mild AS, mild-mod AR, mild-mod MR, mild MS   . Asbestos exposure     "when I was a young man"  . Peripheral vascular disease     a) reported hx of infrainguinal arterial occlusive disease. b) Bilateral CEA  in 2001 with RCEA operation complicated by neck hematoma and l forearm hematoma/compartment syndrome s/p fasciotomy  . Hyperlipidemia   . CAD (coronary artery disease)      followed by dr Bettina Gavia.last seen 03/2011  . HTN (hypertension)     followed by dr Lelon Frohlich  . Permanent atrial fibrillation     With LBBB pattern. On Chronic Coumadin  . Heart murmur   . CHF (congestive heart failure)   . Pneumonia 03/2013   "this is my first bout w/pneumonia" (04/21/2013)  . Exertional shortness of breath   . History of blood transfusion     "related to LUE ORs"   . Sleep apnea     "mild; doesn''t wear mask (04/15/2013)  . Basal cell carcinoma     "all over my body; arms, face, head" (04/15/2013)   PSH  Past Surgical History  Procedure Laterality Date  . Carotid endarterectomy Bilateral   . Fasciotomy      for compartment syndrome of L forearm following RCEA  . Mitral valve repair  02/1999  . Fasciotomy  07/14/2011    Procedure: FASCIOTOMY;  Surgeon: Newt Minion, MD;  Location: [place];  Service: Orthopedics;  Laterality: Left;  . I&d extremity  07/16/2011    Procedure: IRRIGATION AND DEBRIDEMENT EXTREMITY;  Surgeon: Newt Minion, MD;  Location: [place];  Service: Orthopedics;  Laterality: Left;  Irrigation and Debridement Left Forearm, apply Acell and Wound VAC  . I&d extremity  08/06/2011    Procedure: IRRIGATION AND DEBRIDEMENT EXTREMITY;  Surgeon: Newt Minion, MD;  Location: [place];  Service: Orthopedics;  Laterality: Left;  Irrigation and Debridement/Wound Closure Left Forearm  . Femur fracture surgery Right 2009    "put rod in"  . Lung surgery Left 2000    LEFT RUPTURED LESION  2008 [facility]  . Skin split graft  09/08/2011    Procedure: SKIN GRAFT SPLIT THICKNESS;  Surgeon: Newt Minion, MD;  Location: [place];  Service:  Orthopedics;  Laterality: Left;  Split Thickness Skin Graft from Left Thigh to Left Forearm   . Eye surgery    . Vasectomy    . Coronary artery bypass graft  02/1999    "CABG X3"  . Cardiac catheterization  02/1999  . Cataract extraction w/ intraocular lens  implant, bilateral Bilateral 2011-2014   FH  Family History  Problem Relation Age of Onset  . Stroke Mother     Died at 71 of a stroke  . Lung disease Brother     Brother died of black lung disease   SH  reports that he quit smoking about 14 years ago. His smoking use included Cigarettes. He has a 60 pack-year smoking  history. He quit smokeless tobacco use about 14 years ago. His smokeless tobacco use included Snuff. He reports that he drinks about 8.4 ounces of alcohol per week. He reports that he does not use illicit drugs. Allergies No Known Allergies Home medications Prior to Admission medications   Medication Sig Start Date End Date Taking? Authorizing Provider  amiodarone (PACERONE) 200 MG tablet Take 200-400 mg by mouth See admin instructions. Takes 400mg  on M,W,F each week All other days takes 200mg    Yes Historical Provider, MD  aspirin EC 81 MG tablet Take 81 mg by mouth at bedtime.    Yes Historical Provider, MD  atorvastatin (LIPITOR) 20 MG tablet Take 20 mg by mouth daily at 6 PM.    Yes Historical Provider, MD  digoxin (LANOXIN) 0.125 MG tablet Take 125 mcg by mouth daily.   Yes Historical Provider, MD  diltiazem (DILACOR XR) 180 MG 24 hr capsule Take 180 mg by mouth daily.   Yes Historical Provider, MD  furosemide (LASIX) 40 MG tablet Take 40 mg by mouth daily.   Yes Historical Provider, MD  levofloxacin (LEVAQUIN) 500 MG tablet Take 1 tablet (500 mg total) by mouth daily. 04/09/13  Yes Alveda Reasons, MD  PE-DM-APAP & Doxylamin-DM-APAP (VICKS DAYQUIL/NYQUIL CLD & FLU) (LIQUID) MISC Take 2 capsules by mouth 2 (two) times daily as needed (for cold).   Yes Historical Provider, MD  potassium chloride (K-DUR,KLOR-CON) 10 MEQ tablet Take 10 mEq by mouth daily.   Yes Historical Provider, MD  ramipril (ALTACE) 5 MG capsule Take 5 mg by mouth daily.   Yes Historical Provider, MD  VITAMIN E PO Take 1 tablet by mouth daily.   Yes Historical Provider, MD  warfarin (COUMADIN) 5 MG tablet Take 2.5-5 mg by mouth See admin instructions. Alternating dosage 2.5mg , 5mg , 2.5mg , etc    Historical Provider, MD    Current Medications Current Facility-Administered Medications  Medication Dose Route Frequency Provider Last Rate Last Dose  . 0.9 %  sodium chloride infusion   Intravenous Continuous Doree Fudge, MD 20 mL/hr at 04/30/13 1000 20 mL/hr at 04/30/13 1000  . acetaminophen (TYLENOL) tablet 650 mg  650 mg Per Tube Q4H PRN Wilhelmina Mcardle, MD      . antiseptic oral rinse (BIOTENE) solution 15 mL  15 mL Mouth Rinse QID Doree Fudge, MD   15 mL at 04/30/13 0400  . aspirin chewable tablet 81 mg  81 mg Per Tube QHS Wilhelmina Mcardle, MD      . chlorhexidine (PERIDEX) 0.12 % solution 15 mL  15 mL Mouth Rinse BID Doree Fudge, MD   15 mL at 04/30/13 1206  . feeding supplement (VITAL HIGH PROTEIN) liquid 1,000 mL  1,000 mL Per Tube Q24H Wilhelmina Mcardle, MD      .  fentaNYL (SUBLIMAZE) 10 mcg/mL in sodium chloride 0.9 % 250 mL infusion  25-400 mcg/hr Intravenous Continuous Rush Farmer, MD 15 mL/hr at 04/30/13 0615 150 mcg/hr at 04/30/13 0615  . heparin injection 3,000 Units  3,000 Units Intravenous Once Erick Colace, NP      . hydrocortisone sodium succinate (SOLU-CORTEF) 100 MG injection 50 mg  50 mg Intravenous Q6H Wilhelmina Mcardle, MD   50 mg at 04/30/13 1228  . insulin aspart (novoLOG) injection 0-15 Units  0-15 Units Subcutaneous Q4H Wilhelmina Mcardle, MD   3 Units at 04/30/13 1155  . insulin glargine (LANTUS) injection 15 Units  15 Units Subcutaneous Daily Wilhelmina Mcardle, MD   15 Units at 04/30/13 1232  . levalbuterol (XOPENEX) nebulizer solution 0.63 mg  0.63 mg Nebulization Q6H Wilhelmina Mcardle, MD      . LORazepam (ATIVAN) injection 1 mg  1 mg Intravenous Q4H PRN Rush Farmer, MD   1 mg at 04/29/13 1148  . metoprolol (LOPRESSOR) injection 2.5-5 mg  2.5-5 mg Intravenous Q3H PRN Wilhelmina Mcardle, MD      . norepinephrine (LEVOPHED) 16 mg in dextrose 5 % 250 mL infusion  2-50 mcg/min Intravenous Titrated Wilhelmina Mcardle, MD 1.9 mL/hr at 04/30/13 0615 2 mcg/min at 04/30/13 0615  . pantoprazole (PROTONIX) injection 40 mg  40 mg Intravenous Q24H Doree Fudge, MD   40 mg at 04/29/13 2152  . piperacillin-tazobactam (ZOSYN) IVPB 3.375 g  3.375 g Intravenous  Q8H Dareen Piano, [facility]      . piperacillin-tazobactam (ZOSYN) IVPB 3.375 g  3.375 g Intravenous Once Dareen Piano, [facility]      . vancomycin (VANCOCIN) 1,750 mg in sodium chloride 0.9 % 500 mL IVPB  1,750 mg Intravenous Once Dareen Piano, [facility]      . Warfarin - Pharmacist Dosing Inpatient   Does not apply q1800 Pat Patrick, [facility]        CBC  Recent Labs Lab 04/27/13 0309 04/28/13 0318 04/30/13 0430  WBC 22.3* 27.4* 53.0*  HGB 12.7* 12.1* 11.3*  HCT 38.0* 37.5* 36.8*  MCV 93.8 94.2 99.7  PLT 625* 467* 761   Basic Metabolic Panel  Recent Labs Lab 04/24/13 0353 04/25/13 0424 04/26/13 0329 04/27/13 0309 04/28/13 0318 04/30/13 0430  NA 138 142 139 141 140 143  K 3.7 3.8 5.0 4.3 4.6 5.9*  CL 97 101 101 99 99 95*  CO2 28 28 27 29  32 32  GLUCOSE 141* 186* 152* 182* 210* 278*  BUN 26* 34* 36* 34* 33* 60*  CREATININE 0.81 0.95 0.86 0.82 0.83 2.38*  CALCIUM 8.1* 7.9* 7.8* 8.0* 7.9* 6.4*  PHOS 4.5  --  3.6 3.9 4.3  --     Physical Exam  Blood pressure 118/50, pulse 73, temperature 98.8 F (37.1 C), temperature source Oral, resp. rate 20, height 6\' 4"  (1.93 m), weight 88.2 kg (194 lb 7.1 oz), SpO2 97.00%. GEN: intubated, sedated ENT: ETT in place. R IJ TLC in place EYES: eyes closed CV: tachy, IRIR PULM: coarse bs b/l ABD: s/nt/nd SKIN: no rashes/lesions YWV:PXTGG edema in LEE b/l NEURO: sedated PSYCH; sedeated GU: foley catheter in place   A/P 77M with oliguric AKI in setting of hypoxic/hypercapneic VDRF, shock likely 2/2 sepsis.  He also has hyperkalemia.   1. Oliguric AKI: agree with placement of HD Catheter.  Will follow BMP this PM.  Pt with adequate fluid challenge, but weight not too different throughout this hospitalization.  Foley placed and renal US ordered.  No clear nephrotoxin exposures.  Likely ATN related to critical illness and sepsis.  Very well could need CRRT or IHD in next 24h. UA already ordered.   2. Hyperkalemia: s/p kayexalate  earlier today, follow BMP this PM 3. VDRF, hypoxia and hypercapniea: per CCM 4. Metabolic Alkalosis: partially compensatory, agree with stopping NaHCO3 as permitted by respiratory acidosis.   5. Sepsis: Vanc Zosyn, per CCM.    Pearson Grippe MD 04/30/2013, 12:34 PM

## 2013-04-30 NOTE — Procedures (Signed)
    Central Venous hemodialysis Catheter Insertion Procedure Note Connor Ford [id number] [birth year]  Procedure: Insertion of Central Venous Catheter Indications: hemodialysis   Procedure Details Consent: Risks of procedure as well as the alternatives and risks of each were explained to the (patient/caregiver).  Consent for procedure obtained. Time Out: Verified patient identification, verified procedure, site/side was marked, verified correct patient position, special equipment/implants available, medications/allergies/relevent history reviewed, required imaging and test results available.  Performed Real time [place] was used to ID and cannulate the vessel  Maximum sterile technique was used including antiseptics, cap, gloves, gown, hand hygiene, mask and sheet. Skin prep: Chlorhexidine; local anesthetic administered A antimicrobial bonded/coated triple lumen catheter was placed in the left internal jugular vein using the Seldinger technique.  Evaluation Blood flow good Complications: No apparent complications Patient did tolerate procedure well. Chest X-ray ordered to verify placement.  CXR: pending.  BABCOCK,PETE 04/30/2013, 1:21 PM Marni Griffon ACNP-BC [facility] Pager # [phone] OR # [phone] if no answer  I was present for and supervised the entire procedure  Merton Border, MD ; [facility] service [place] [phone].  After 5:30 PM or weekends, call [phone]

## 2013-04-30 NOTE — Progress Notes (Signed)
[place] Progress Note Patient Name: DRACO MALCZEWSKI DOB: [birth year] MRN: [id number]  Date of Service  04/30/2013   HPI/Events of Note     eICU Interventions  ICU hyperG protocol Treat hyperK with Iv insulin/ kayexalate & calcium   Intervention Category Intermediate Interventions: Hyperglycemia - evaluation and treatment;Electrolyte abnormality - evaluation and management  Rosabelle Jupin V. 04/30/2013, 6:21 AM

## 2013-05-01 ENCOUNTER — Inpatient Hospital Stay (HOSPITAL_COMMUNITY): Payer: Medicare PPO

## 2013-05-01 LAB — RENAL FUNCTION PANEL
ALBUMIN: 1.8 g/dL — AB (ref 3.5–5.2)
BUN: 57 mg/dL — ABNORMAL HIGH (ref 6–23)
CALCIUM: 6.8 mg/dL — AB (ref 8.4–10.5)
CO2: 27 meq/L (ref 19–32)
Chloride: 98 mEq/L (ref 96–112)
Creatinine, Ser: 2.66 mg/dL — ABNORMAL HIGH (ref 0.50–1.35)
GFR calc Af Amer: 27 mL/min — ABNORMAL LOW (ref >90)
GFR, EST NON AFRICAN AMERICAN: 23 mL/min — AB (ref >90)
Glucose, Bld: 215 mg/dL — ABNORMAL HIGH (ref 70–99)
Phosphorus: 7.1 mg/dL — ABNORMAL HIGH (ref 2.3–4.6)
Potassium: 5.1 mEq/L (ref 3.7–5.3)
Sodium: 139 mEq/L (ref 137–147)

## 2013-05-01 LAB — GLUCOSE, CAPILLARY
GLUCOSE-CAPILLARY: 117 mg/dL — AB (ref 70–99)
GLUCOSE-CAPILLARY: 117 mg/dL — AB (ref 70–99)
Glucose-Capillary: 103 mg/dL — ABNORMAL HIGH (ref 70–99)
Glucose-Capillary: 118 mg/dL — ABNORMAL HIGH (ref 70–99)
Glucose-Capillary: 132 mg/dL — ABNORMAL HIGH (ref 70–99)
Glucose-Capillary: 164 mg/dL — ABNORMAL HIGH (ref 70–99)
Glucose-Capillary: 174 mg/dL — ABNORMAL HIGH (ref 70–99)
Glucose-Capillary: 72 mg/dL (ref 70–99)

## 2013-05-01 LAB — POCT I-STAT 3, ART BLOOD GAS (G3+)
Acid-Base Excess: 1 mmol/L (ref 0.0–2.0)
Bicarbonate: 33.5 mEq/L — ABNORMAL HIGH (ref 20.0–24.0)
Bicarbonate: 32 mEq/L — ABNORMAL HIGH (ref 20.0–24.0)
O2 SAT: 92 %
O2 Saturation: 97 %
PCO2 ART: 98.3 mmHg — AB (ref 35.0–45.0)
TCO2: 37 mmol/L (ref 0–100)
TCO2: 35 mmol/L (ref 0–100)
pCO2 arterial: 102.7 mmHg (ref 35.0–45.0)
pH, Arterial: 7.122 — CL (ref 7.350–7.450)
pH, Arterial: 7.121 — CL (ref 7.350–7.450)
pO2, Arterial: 133 mmHg — ABNORMAL HIGH (ref 80.0–100.0)
pO2, Arterial: 87 mmHg (ref 80.0–100.0)

## 2013-05-01 LAB — COMPREHENSIVE METABOLIC PANEL
ALK PHOS: 273 U/L — AB (ref 39–117)
ALT: 2002 U/L — AB (ref 0–53)
AST: 1365 U/L — ABNORMAL HIGH (ref 0–37)
Albumin: 1.9 g/dL — ABNORMAL LOW (ref 3.5–5.2)
BILIRUBIN TOTAL: 3.1 mg/dL — AB (ref 0.3–1.2)
BUN: 69 mg/dL — AB (ref 6–23)
CHLORIDE: 98 meq/L (ref 96–112)
CO2: 29 meq/L (ref 19–32)
Calcium: 6.2 mg/dL — CL (ref 8.4–10.5)
Creatinine, Ser: 3.24 mg/dL — ABNORMAL HIGH (ref 0.50–1.35)
GFR, EST AFRICAN AMERICAN: 21 mL/min — AB (ref >90)
GFR, EST NON AFRICAN AMERICAN: 18 mL/min — AB (ref >90)
GLUCOSE: 164 mg/dL — AB (ref 70–99)
POTASSIUM: 5.6 meq/L — AB (ref 3.7–5.3)
SODIUM: 142 meq/L (ref 137–147)
Total Protein: 5.5 g/dL — ABNORMAL LOW (ref 6.0–8.3)

## 2013-05-01 LAB — CBC
HCT: 35.9 % — ABNORMAL LOW (ref 39.0–52.0)
HEMOGLOBIN: 11.1 g/dL — AB (ref 13.0–17.0)
MCH: 31.2 pg (ref 26.0–34.0)
MCHC: 30.9 g/dL (ref 30.0–36.0)
MCV: 100.8 fL — ABNORMAL HIGH (ref 78.0–100.0)
Platelets: 217 10*3/uL (ref 150–400)
RBC: 3.56 MIL/uL — AB (ref 4.22–5.81)
RDW: 15.6 % — ABNORMAL HIGH (ref 11.5–15.5)
WBC: 38.4 10*3/uL — ABNORMAL HIGH (ref 4.0–10.5)

## 2013-05-01 LAB — PROTIME-INR
INR: 4.84 — AB (ref 0.00–1.49)
PROTHROMBIN TIME: 43.3 s — AB (ref 11.6–15.2)

## 2013-05-01 LAB — PRO B NATRIURETIC PEPTIDE: PRO B NATRI PEPTIDE: 8068 pg/mL — AB (ref 0–125)

## 2013-05-01 LAB — PROCALCITONIN: PROCALCITONIN: 6.55 ng/mL

## 2013-05-01 LAB — APTT: APTT: 45 s — AB (ref 24–37)

## 2013-05-01 LAB — MAGNESIUM: Magnesium: 2.7 mg/dL — ABNORMAL HIGH (ref 1.5–2.5)

## 2013-05-01 MED ORDER — INSULIN GLARGINE 100 UNIT/ML ~~LOC~~ SOLN: 10.0000 [IU] | Freq: Every day | SUBCUTANEOUS | Status: DC

## 2013-05-01 MED ORDER — DEXTROSE 50 % IV SOLN
INTRAVENOUS | Status: AC
  Filled 2013-05-01: qty 50

## 2013-05-01 MED ORDER — HEPARIN SODIUM (PORCINE) 5000 UNIT/ML IJ SOLN: 5000.0000 [IU] | Freq: Three times a day (TID) | INTRAMUSCULAR | Status: DC

## 2013-05-01 MED ORDER — SODIUM CHLORIDE 0.9 % IV SOLN
INTRAVENOUS | Status: DC
  Administered 2013-05-01: 09:00:00 via INTRAVENOUS

## 2013-05-01 MED ORDER — VITAL HIGH PROTEIN PO LIQD
1000.0000 mL | ORAL | Status: DC
  Administered 2013-05-01 (×2): 1000 mL
  Filled 2013-05-01 (×3): qty 1000

## 2013-05-01 NOTE — Procedures (Signed)
Admit: 03/29/2013 LOS: 74   66M with AKI likely 2/2 ATN in setting of VDRF (hypoxic and hypercapneic), septic shock, hyperkalemia  Current CRRT Prescription: Start Date: 04/30/13 Catheter: L IJ BFR: 150 Pre Blood Pump: 400 DFR: 1500, 4K Replacement Rate: 400 Goal UF: -50 Anticoagulation: heparin 500 u/hr Clotting: none to this point  S: Persistent hyperkalemia, worsening GFR, little UOP yesterday, started on CRRT 04/30/13 evening Renal US w/o obstruction On Vanc/Zosyn Has not moved kayexlalate from yesterday yet  O: 02/02 0701 - 02/03 0700 In: 2194.7 [I.V.:1087.2; NG/GT:407.5; IV Piggyback:700] Out: 1276 [Urine:210]  RRR Abd s/nd.  Quiet but present BS L IJ HD Catheter  Filed Weights   04/28/13 0455 04/29/13 0442 05/01/13 0403  Weight: 89 kg (196 lb 3.4 oz) 88.2 kg (194 lb 7.1 oz) 92.4 kg (203 lb 11.3 oz)     Recent Labs Lab 04/27/13 0309 04/28/13 0318 04/30/13 0430 04/30/13 1700 05/01/13 0410  NA 141 140 143 145 142  K 4.3 4.6 5.9* 6.2* 5.6*  CL 99 99 95* 98 98  CO2 29 32 32 35* 29  GLUCOSE 182* 210* 278* 141* 164*  BUN 34* 33* 60* 76* 69*  CREATININE 0.82 0.83 2.38* 3.33* 3.24*  CALCIUM 8.0* 7.9* 6.4* 6.4* 6.2*  PHOS 3.9 4.3  --  10.5*  --     Recent Labs Lab 04/28/13 0318 04/30/13 0430 05/01/13 0410  WBC 27.4* 53.0* 38.4*  HGB 12.1* 11.3* 11.1*  HCT 37.5* 36.8* 35.9*  MCV 94.2 99.7 100.8*  PLT 467* 302 217    ABG    Component Value Date/Time   PHART 7.278* 04/30/2013 0629   PCO2ART 76.6* 04/30/2013 0629   PO2ART 148.0* 04/30/2013 0629   HCO3 36.1* 04/30/2013 0629   TCO2 38 04/30/2013 0629   ACIDBASEDEF 3.0* 04/29/2013 1619   O2SAT 99.0 04/30/2013 0629    Plan:  1. Dialysis dependent oliguric AKI 2/2 ATN in setting of VDRF, septic shock.  Cont CRRT today. Inc BFR to 250.  Follow K but keep on 4K bath for now.  Follow BID renal panel.  2. Hyperkalemia: as above.  S/p 75gm of kayexalate 04/30/13.  Would not give any further. 3. Metabolic Alkalosis: related  to hypercapneic RF and NaHCO3 gtt yesterday.  Improving 4. VDRF: per CCM 5. Septic Shock: Vanc/Zosyn, per [place], MD [facility] pgr [phone]

## 2013-05-01 NOTE — Progress Notes (Addendum)
PULMONARY / CRITICAL CARE MEDICINE  Name: Connor Ford MRN: [id number] DOB: 1945/08/10    ADMISSION DATE:  04/14/2013 CONSULTATION DATE:  04/13/2013  REFERRING MD :  [facility] PRIMARY SERVICE:  PCCM  CHIEF COMPLAINT:  Acute respiratory failure  BRIEF PATIENT DESCRIPTION: 68 y/o w/ PMHx of systolic CHF and sleep apnea admitted on 1/15 with dyspnea, fever, and pulmonary infiltrates. On 1/16, patient developed acute respiratory distress and CXR demonstrated worsening bilateral airspace disease.    SIGNIFICANT EVENTS / STUDIES:  1/15 Admitted for pneumonia 1/16 Transferred to ICU/Intubated 1/17 Abd [place]: mild distention of GB. O/W normal 1/19 Extubated 1/23 Transfer to SDU 1/26 Unable to maintain sats on 100%. NPPV resumed 1/26 1/31 persistent hypoxemia. Intermittent NPPV 2/01 transfer to ICU for refractory hypoxemia. Re-intubated 2/01 Refractory hypotension, vasopressors started 2/02 oliguric renal failure. Renal consult. Suspected severe sepsis/shock. Cultures obtained. Empiric abx started.  2/02 Renal US: Normal  LINES / TUBES: ETT 1/16 >> 1/19 CVL 1/16 >> 1/22 R. Radial A-line 1/16 >> 1/20 ETT 2/01 >>  R IJ CVL 2/01 >>  R radial A-line 2/01 >>   CULTURES: 1/15 Blood >>>neg 1/15 Urine >>>no growth 1/15 Resp virus panel nasal swab >>> neg 1/17 Resp >>>few candida, negative 1/18 Resp virus panel trach aspir >>> Flu A positive 1/19 Blood >>>neg Urine 2/02 >> UA negative Resp 2/02 >>  Blood 2/02 >>    ANTIBIOTICS: PCT 2/02 - 8.47, 2/03 - 6.55, 2/04 -   Ceftriaxone 1/15 >>> 1/16 Azithromycin 1/15 >>> 1/16  Levaquin 1/16 >>> 1/16 Tamiflu 1/16 >>> 1/19 Vancomycin 1/16 >>> 1/21 Zosyn 1/16 >>> 1/20 Rocephin 1/20 >>>1/23 Levaquin 1/23 >>>1/26 Vanc 2/01 >> Pip-tazo 2/01 >>    SUBJ: RASS -3, not F/C. MAEs. Agitated on WUA   VITAL SIGNS: Temp:  [97.7 F (36.5 C)-98.8 F (37.1 C)] 98.3 F (36.8 C) (02/03 1200) Pulse Rate:  [69-106] 83 (02/03 1300) Resp:  [0-27] 27  (02/03 1300) BP: (108-141)/(42-52) 141/52 mmHg (02/03 0315) SpO2:  [90 %-100 %] 95 % (02/03 1346) Arterial Line BP: (103-148)/(38-100) 142/51 mmHg (02/03 1300) FiO2 (%):  [50 %-70 %] 50 % (02/03 1346) Weight:  [92.4 kg (203 lb 11.3 oz)] 92.4 kg (203 lb 11.3 oz) (02/03 0403)   INTAKE / OUTPUT: Intake/Output     02/02 0701 - 02/03 0700 02/03 0701 - 02/04 0700   I.V. (mL/kg) 1092.2 (11.8) 374.6 (4.1)   NG/GT 407.5 240   IV Piggyback 700 150   Total Intake(mL/kg) 2199.7 (23.8) 764.6 (8.3)   Urine (mL/kg/hr) 210 (0.1) 10 (0)   Emesis/NG output     Other 1066 (0.5) 1205 (1.8)   Total Output 1276 1215   Net +923.7 -450.4         PHYSICAL EXAMINATION: General:  RASS -3, not F/C Neuro:  Sedated, MAEs HEENT: WNL Cardiovascular: RRR, soft systolic M Lungs: bilat dependent crackles Abdomen:  Soft, NT, +BS Musculoskeletal: No edema. Chronic venous stasis changes in LE's.  LABS: I have reviewed all of today's lab results. Relevant abnormalities are discussed in the A/P section   ASSESSMENT / PLAN:  PULMONARY A:   Acute hypoxemic respiratory failure Permissive hypercarbia Recent influenza A ARDS, recurrent - on steroids for late phase ARDS vs amio tox R/O pulmonary edema  P:   Vent changes made Cont vent bundle Daily SBT as indicated Change BDs to scheduled DC methyl pred - stress dose HC started 2/02  CARDIOVASCULAR A:  H/O CHF with preserved LVEF Mod mitral stenosis by  Echo Pulm HTN by Echo CAF, rate controlled Shock - presumed septic P:  Titrate NE to maintain MAP > 65 mmHg Cont PRN metoprolol to maintain HR < 115/min Check BNP and trop I 2/02 Change methyl pred to hydrocort  GASTROINTESTINAL A:   Elevated transaminases, alk phos - suspect hepatic congestion Gastric ileus (high residuals) P:   SUP: IV PPI Cont TFs @ reduced rate  INFECTIOUS A:   Influenza A, resolved CAP, resolved Suspected HCAP Severe leukocytosis (on steroids) Suspected severe  sepsis P:   Micro and abx as above  ENDO A:  Hyperglycemia due to steroids/stress No prior hx of DM P:  DC lantus Cont SSI  HEME: A: Anemia of critical illness P:  DVT px: full anticoagulation with warfarin Monitor CBC intermittently Restrictive transfusion strategy  NEURO: A:  ICU/vent associated discomfort P:  Cont fentanyl infusion Cont PRN lorazepam Daily WUA  Wife and 2 sisters updated @ bedside. I conveyed the critical nature of his illness. We agree to continued full aggressive support and treatment but DNR in event of cardiac arrest. The are certain that this is in line with his previously defined wishes   CCM X 73 mins   Merton Border, MD ; [facility] service Mobile [phone].  After 5:30 PM or weekends, call [phone]

## 2013-05-01 NOTE — Progress Notes (Signed)
NUTRITION FOLLOW UP  Intervention:   1.  Enteral nutrition;  If warranted by MD, recommend advance Vital High Protein by 10 mL q 4 hrs to 80 mL/hr goal to provide 1920 kcal, 168g protein, 1593 mL free water. Pt needs may be better met by Vital 1.2 @ 65 mL/hr which would provide 1872 kcal, 117g protein, and 1265 mL free water.   Nutrition Dx:   Inadequate oral intake related to difficulty breathing and poor appetite, ongoing  Monitor:   1.  Enteral nutrition; initiation with tolerance.  Pt to meet >/=90% estimated needs with nutrition support.   Assessment:   Pt with complex medical history including atrial fibrillation on Coumadin and amiodarone, digoxin and Diltiazem, HTN, HLD, systolic CHF, presented with main concern of several days duration of progressively worsening shortness of breath that initially started with exertion and now present at rest. Pt explains he has seen his PCP and was diagnosed with PNA, and was started on Levaquin but his symptoms did not improve.   Pt developed persistent hypoxia (1/26) and ultimately required intubation yesterday.  Pt also with oliguric renal failure.   Patient is currently intubated on ventilator support.  MV: 10.3 L/min Temp (24hrs), Avg:98 F (36.7 C), Min:97.7 F (36.5 C), Max:98.8 F (37.1 C)  Propofol: none  MD has started enteral nutrition for pt.  Currently, Vital High Protein infusing at 20 mL/hr which provides 480 kcal, 42g protein, 398 mL free water.   Height: Ht Readings from Last 1 Encounters:  04/20/13 $RemoveB'6\' 4"'OISIWVoB$  (1.93 m)    Weight Status:   Wt Readings from Last 1 Encounters:  05/01/13 203 lb 11.3 oz (92.4 kg)    Body mass index is 24.81 kg/(m^2).  Re-estimated needs:  Kcal: 1931 Protein: 110-130 gm Fluid: 2.1-2.3 L  Skin: Intact  Diet Order:  NPO   Intake/Output Summary (Last 24 hours) at 05/01/13 1356 Last data filed at 05/01/13 1300  Gross per 24 hour  Intake 2530.07 ml  Output   2167 ml  Net 363.07 ml     Labs:   Recent Labs Lab 04/27/13 0309 04/28/13 0318 04/30/13 0430 04/30/13 1700 05/01/13 0410  NA 141 140 143 145 142  K 4.3 4.6 5.9* 6.2* 5.6*  CL 99 99 95* 98 98  CO2 29 32 32 35* 29  BUN 34* 33* 60* 76* 69*  CREATININE 0.82 0.83 2.38* 3.33* 3.24*  CALCIUM 8.0* 7.9* 6.4* 6.4* 6.2*  MG 2.2 2.2  --   --  2.7*  PHOS 3.9 4.3  --  10.5*  --   GLUCOSE 182* 210* 278* 141* 164*    Scheduled Meds: . antiseptic oral rinse  15 mL Mouth Rinse QID  . aspirin  81 mg Per Tube QHS  . chlorhexidine  15 mL Mouth Rinse BID  . dextrose      . feeding supplement (VITAL HIGH PROTEIN)  1,000 mL Per Tube Q24H  . hydrocortisone sod succinate (SOLU-CORTEF) inj  50 mg Intravenous Q6H  . insulin aspart  0-15 Units Subcutaneous Q4H  . levalbuterol  0.63 mg Nebulization Q6H  . pantoprazole (PROTONIX) IV  40 mg Intravenous Q24H  . piperacillin-tazobactam  3.375 g Intravenous Q6H  . vancomycin  1,000 mg Intravenous Q24H  . Warfarin - Pharmacist Dosing Inpatient   Does not apply q1800    Continuous Infusions: . sodium chloride 20 mL/hr (04/30/13 1000)  . sodium chloride 20 mL/hr at 05/01/13 0918  . fentaNYL infusion INTRAVENOUS 100 mcg/hr (04/30/13 2100)  .  heparin 10,000 units/ 20 mL infusion syringe 1 mL/hr at 04/30/13 2253  . norepinephrine (LEVOPHED) Adult infusion 3 mcg/min (05/01/13 1300)  . dialysis replacement fluid (prismasate) 400 mL/hr at 05/01/13 1100  . dialysis replacement fluid (prismasate) 400 mL/hr at 05/01/13 1100  . dialysate (PRISMASATE) 1,500 mL/hr at 05/01/13 1209    Brynda Greathouse, MS RD LDN Clinical Inpatient Dietitian Pager: [phone] Weekend/After hours pager: [phone]

## 2013-05-01 NOTE — Progress Notes (Signed)
Chart review complete.  Patient is not eligible for [facility] Care Management services because his/her PCP is not a [facility] primary care provider or is not [facility] affiliated.  For any additional questions or new referrals please contact Tim Henderson BSN RN [facility] Liaison at [phone] °

## 2013-05-01 NOTE — Progress Notes (Signed)
ANTICOAGULATION CONSULT NOTE - Follow Up Consult  Pharmacy Consult for Coumadin Indication: atrial fibrillation  No Known Allergies  Patient Measurements: Height: 6\' 4"  (193 cm) Weight: 203 lb 11.3 oz (92.4 kg) IBW/kg (Calculated) : 86.8  Vital Signs: Temp: 98.8 F (37.1 C) (02/03 0800) Temp src: Oral (02/03 0800) BP: 141/52 mmHg (02/03 0315) Pulse Rate: 86 (02/03 0900)  Labs:  Recent Labs  04/29/13 0645 04/30/13 0400 04/30/13 0430 04/30/13 1141 04/30/13 1658 04/30/13 1700 04/30/13 2300 05/01/13 0410  HGB  --   --  11.3*  --   --   --   --  11.1*  HCT  --   --  36.8*  --   --   --   --  35.9*  PLT  --   --  302  --   --   --   --  217  APTT  --   --   --   --   --   --   --  45*  LABPROT 35.2* 42.1*  --   --   --   --   --  43.3*  INR 3.69* 4.66*  --   --   --   --   --  4.84*  CREATININE  --   --  2.38*  --   --  3.33*  --  3.24*  TROPONINI  --   --   --  1.90* 1.65*  --  1.20*  --     Estimated Creatinine Clearance: 27.2 ml/min (by C-G formula based on Cr of 3.24).  Assessment:  68 yo M continues on coumadin for afib. INR remains above goal at 4.84 in setting of septic shock and elevated LFTs (AST/ALT 1365/2002) . No bleeding reported. Home regimen for Coumadin was 2.5mg  and 5mg  alternating. Will not give coumadin today but will continue to monitor INR and liver function.    Goal of Therapy:  INR 2-3   Plan:  1. No coumadin tonight 2. F/u AM INR  Harolyn Rutherford, PharmD Clinical Pharmacist - Resident Pager: [phone] Pharmacy: [phone] 05/01/2013 10:20 AM

## 2013-05-02 ENCOUNTER — Inpatient Hospital Stay (HOSPITAL_COMMUNITY): Payer: Medicare PPO

## 2013-05-02 LAB — GLUCOSE, CAPILLARY
Glucose-Capillary: 146 mg/dL — ABNORMAL HIGH (ref 70–99)
Glucose-Capillary: 119 mg/dL — ABNORMAL HIGH (ref 70–99)

## 2013-05-02 LAB — CBC
HCT: 37.1 % — ABNORMAL LOW (ref 39.0–52.0)
HEMOGLOBIN: 11.1 g/dL — AB (ref 13.0–17.0)
MCH: 30.8 pg (ref 26.0–34.0)
MCHC: 29.9 g/dL — AB (ref 30.0–36.0)
MCV: 103.1 fL — ABNORMAL HIGH (ref 78.0–100.0)
Platelets: 234 10*3/uL (ref 150–400)
RBC: 3.6 MIL/uL — AB (ref 4.22–5.81)
RDW: 15.9 % — ABNORMAL HIGH (ref 11.5–15.5)
WBC: 31.3 10*3/uL — AB (ref 4.0–10.5)

## 2013-05-02 LAB — BLOOD GAS, ARTERIAL
ACID-BASE DEFICIT: 1.6 mmol/L (ref 0.0–2.0)
Bicarbonate: 27.1 mEq/L — ABNORMAL HIGH (ref 20.0–24.0)
DRAWN BY: 331761
FIO2: 0.5 %
LHR: 26 {breaths}/min
MECHVT: 500 mL
O2 SAT: 94.7 %
PCO2 ART: 77.1 mmHg — AB (ref 35.0–45.0)
PEEP/CPAP: 8 cmH2O
Patient temperature: 93.2
TCO2: 29.8 mmol/L (ref 0–100)
pH, Arterial: 7.148 — CL (ref 7.350–7.450)
pO2, Arterial: 68.9 mmHg — ABNORMAL LOW (ref 80.0–100.0)

## 2013-05-02 LAB — HEPATIC FUNCTION PANEL
ALT: 1700 U/L — AB (ref 0–53)
AST: 630 U/L — ABNORMAL HIGH (ref 0–37)
Albumin: 1.9 g/dL — ABNORMAL LOW (ref 3.5–5.2)
Alkaline Phosphatase: 245 U/L — ABNORMAL HIGH (ref 39–117)
Bilirubin, Direct: 1.7 mg/dL — ABNORMAL HIGH (ref 0.0–0.3)
Indirect Bilirubin: 0.8 mg/dL (ref 0.3–0.9)
TOTAL PROTEIN: 5.9 g/dL — AB (ref 6.0–8.3)
Total Bilirubin: 2.5 mg/dL — ABNORMAL HIGH (ref 0.3–1.2)

## 2013-05-02 LAB — PROTIME-INR
INR: 4.78 — ABNORMAL HIGH (ref 0.00–1.49)
Prothrombin Time: 42.9 seconds — ABNORMAL HIGH (ref 11.6–15.2)

## 2013-05-02 LAB — RENAL FUNCTION PANEL
Albumin: 1.9 g/dL — ABNORMAL LOW (ref 3.5–5.2)
BUN: 46 mg/dL — ABNORMAL HIGH (ref 6–23)
CO2: 27 meq/L (ref 19–32)
Calcium: 7.2 mg/dL — ABNORMAL LOW (ref 8.4–10.5)
Chloride: 101 mEq/L (ref 96–112)
Creatinine, Ser: 2.2 mg/dL — ABNORMAL HIGH (ref 0.50–1.35)
GFR calc non Af Amer: 29 mL/min — ABNORMAL LOW (ref >90)
GFR, EST AFRICAN AMERICAN: 34 mL/min — AB (ref >90)
GLUCOSE: 163 mg/dL — AB (ref 70–99)
Phosphorus: 6.3 mg/dL — ABNORMAL HIGH (ref 2.3–4.6)
Potassium: 5.4 mEq/L — ABNORMAL HIGH (ref 3.7–5.3)
Sodium: 139 mEq/L (ref 137–147)

## 2013-05-02 LAB — MAGNESIUM: Magnesium: 2.9 mg/dL — ABNORMAL HIGH (ref 1.5–2.5)

## 2013-05-02 LAB — PROCALCITONIN: PROCALCITONIN: 5.28 ng/mL

## 2013-05-02 LAB — APTT: APTT: 87 s — AB (ref 24–37)

## 2013-05-02 MED ORDER — LORAZEPAM 2 MG/ML IJ SOLN: 1.0000 mg | INTRAMUSCULAR | Status: DC | PRN

## 2013-05-02 MED ORDER — FENTANYL BOLUS VIA INFUSION
50.0000 ug | INTRAVENOUS | Status: DC | PRN
  Filled 2013-05-02: qty 200

## 2013-05-06 LAB — CULTURE, BLOOD (ROUTINE X 2)
CULTURE: NO GROWTH
CULTURE: NO GROWTH

## 2013-05-24 NOTE — Discharge Summary (Signed)
DEATH SUMMARY  DATE OF ADMISSION:  04/21/22  DATE OF DISCHARGE/DEATH:    ADMISSION DIAGNOSES:   Acute respiratory failure  CAP (community acquired pneumonia)  Systolic CHF  Atrial fibrillation  Acute renal failure  Leukocytosis  Transaminitis  HYPERLIPIDEMIA  HYPERTENSION     DISCHARGE DIAGNOSES:   Acute hypoxemic respiratory failure  Recent influenza A  ARDS, relapsing  CAP, resolved  Suspected HCAP, NOS Severe leukocytosis  Severe sepsis  Septic shock  H/O CHF with preserved LVEF  Moderate mitral stenosis  Pulm HTN  CAF, rate controlled  Acute renal failure - likely sepsis related ATN  Elevated transaminases, likely hepatic congestion  Gastric ileus  Hyperglycemia due to steroids/stress  Anemia of critical illness    PRESENTATION:   Pt was admitted by Dr Charlies Silvers of [facility] with the following HPI and the above admission diagnoses:  Pt is 68 yo male with complex medical history including atrial fibrillation on Coumadin and amiodarone, digoxin and Diltiazem, HTN (on Ramipril), HLD, systolic CHF with EF 40 % per last 2 D ECHO in 2013, now presenting to [facility] ED with main concern of several days duration of progressively worsening shortness of breath that initially started with exertion and now present at rest. Pt explains he has seen his PCP and was diagnosed with PNA, and was started on Levaquin but his symptoms have not improved. He now has fevers, chills, malaise, poor oral intake. Pt denies similar events in the past, denies known sick contacts or exposures, no specific alleviating or aggravating factors, no specific abdominal or urinary concerns.  In ED, pt found to have oxygen saturation 90% on RA, coughing and with Tmax 101 F. CXR worrisome for PNA and vascular congestion. Pt started on Zithromax and Rocephin. TRH asked to admit for further evaluation. Pt also given total of 500 cc IVF NS.    HOSPITAL COURSE:   2022-04-21 Admitted with dx of pneumonia  1/16 Transferred to  ICU/Intubated  1/17 Abd [place]: mild distention of GB. O/W normal  1/19 Extubated  1/23 Transfer to SDU  1/26 Unable to maintain sats on 100%. NPPV resumed  1/26-1/31 persistent hypoxemia. Intermittent NPPV  2/01 transfer to ICU for refractory hypoxemia. Re-intubated  2/01 Refractory hypotension, vasopressors started  2/02 oliguric renal failure. Renal consult. Suspected HCAP with severe sepsis/shock. Cultures obtained. Empiric abx started.  2/02 Renal US: Normal  2/03 -2/04 Remained anuric. Worsening hypotension. New development of hypothermia  2/04 Conference with wife, 3 siblings and 2 sons. I explained that there was a decreasing chance of survival . More importantly, if he were to survive, it would be with a marked degree of debilitation - especially due to permanent respiratory disease. 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 The entire family was in strong agreement that he would not wish to survive with significant decrement in QOL from his prior level. They all believe that he would direct [place] to stop our aggressive measures and provide only comfort measures.. This was deemed medically acceptable. Full comfort measures initiated and vent/vasopressors/CRRT discontinued   LINES / TUBES:  ETT 1/16 >> 1/19  CVL 1/16 >> 1/22  R. Radial A-line 1/16 >> 1/20  ETT 2/01 >>  R IJ CVL 2/01 >>  R radial A-line 2/01 >>  L IJ HD cath 2/02 >>   CULTURES:  04-21-22 Blood >>>neg  04/21/22 Urine >>>no growth  Apr 21, 2022 Resp virus panel nasal swab >>> neg  1/17 Resp >>>few candida, negative  1/18 Resp virus panel trach aspir >>> Flu A positive  1/19 Blood >>>neg  Urine 2/02 >> UA negative  Resp 2/02 >> cancelled  Blood 2/02 >> NEG  ANTIBIOTICS:  PCT 2/02 - 8.47, 2/03 - 6.55, 05/05/22 -  Ceftriaxone 1/15 >>> 1/16  Azithromycin 1/15 >>> 1/16  Levaquin 1/16 >>> 1/16  Tamiflu 1/16 >>> 1/19  Vancomycin 1/16 >>> 1/21  Zosyn 1/16 >>> 1/20  Rocephin 1/20  >>>1/23  Levaquin 1/23 >>>1/26  Vanc 02-May-2022 >> 05/05/2022  Pip-tazo 05-02-22 >> 05-05-2022     Cause of death:  ARDS, severe sepsis/septic shock    Merton Border, MD ; [facility] service Mobile [phone].  After 5:30 PM or weekends, call [phone]

## 2013-05-27 NOTE — Progress Notes (Signed)
Pt extubated per protocol to comfort. Family at bedside. Pt pronounced at 1420 per protocol with RN X 2 with no RR or HR.  CDS notified. MD notified. Comfort measures ongoing with family. Will prepare for post-mortum care.

## 2013-05-27 NOTE — Progress Notes (Signed)
[place] with MD regarding plan of care. Family expresses concern & want pt to be comfort care. CRRT d/c'd. Comfort care measures initiated for pt & family.

## 2013-05-27 NOTE — Progress Notes (Signed)
ANTICOAGULATION CONSULT NOTE - Follow Up Consult  Pharmacy Consult for Coumadin Indication: atrial fibrillation  No Known Allergies  Patient Measurements: Height: 6\' 4"  (193 cm) Weight: 201 lb 1 oz (91.2 kg) IBW/kg (Calculated) : 86.8  Vital Signs: Temp: 96.4 F (35.8 C) (02/04 1000) Temp src: Core (Comment) (02/04 0900) BP: 106/34 mmHg (02/04 1000) Pulse Rate: 100 (02/04 1000)  Labs:  Recent Labs  04/30/13 0400  04/30/13 0430 04/30/13 1141 04/30/13 1658  04/30/13 2300 05/01/13 0410 05/01/13 1600 2013/05/04 0410 May 04, 2013 0413  HGB  --   < > 11.3*  --   --   --   --  11.1*  --  11.1*  --   HCT  --   --  36.8*  --   --   --   --  35.9*  --  37.1*  --   PLT  --   --  302  --   --   --   --  217  --  234  --   APTT  --   --   --   --   --   --   --  45*  --  87*  --   LABPROT 42.1*  --   --   --   --   --   --  43.3*  --  42.9*  --   INR 4.66*  --   --   --   --   --   --  4.84*  --  4.78*  --   CREATININE  --   --  2.38*  --   --   < >  --  3.24* 2.66*  --  2.20*  TROPONINI  --   --   --  1.90* 1.65*  --  1.20*  --   --   --   --   < > = values in this interval not displayed.  Estimated Creatinine Clearance: 40 ml/min (by C-G formula based on Cr of 2.2).  Assessment:  68 yo M continues on coumadin for afib. INR remains above goal at 4.78 in setting of septic shock and elevated LFTs (AST/ALT 1365/2002) . No bleeding reported. Home regimen for Coumadin was 2.5mg  and 5mg  alternating. Will not give coumadin today but will continue to monitor INR and liver function.    Goal of Therapy:  INR 2-3   Plan:  1. No coumadin tonight 2. F/u AM INR  Harolyn Rutherford, PharmD Clinical Pharmacist - Resident Pager: [phone] Pharmacy: [phone] 05/04/2013 10:56 AM

## 2013-05-27 NOTE — Progress Notes (Signed)
Wasted 135ml Fentanyl IV per protocol. Post- mortum care done per protocol. C Kenzie Thoreson Rn

## 2013-05-27 NOTE — Procedures (Signed)
Admit: 04/20/2013 LOS: 15   72M with AKI likely 2/2 ATN in setting of VDRF (hypoxic and hypercapneic), septic shock, hyperkalemia  Current CRRT Prescription: Start Date: 04/30/13 Catheter: L IJ BFR: 150 Pre Blood Pump: 400 DFR: 1500, 4K Replacement Rate: 400 Goal UF: -50 Anticoagulation: heparin 500 u/hr Clotting: none to this point  S: Pt hypothermic, increasing pressor requirements Persistent resp acidosis Has not had BM Family considering palliative transition  O: 02/03 0701 - 02/04 0700 In: 2139.8 [I.V.:1199.8; NG/GT:640; IV Piggyback:300] Out: 9390 [Urine:10]   Filed Weights   04/29/13 0442 05/01/13 0403 05/05/2013 0406  Weight: 88.2 kg (194 lb 7.1 oz) 92.4 kg (203 lb 11.3 oz) 91.2 kg (201 lb 1 oz)     Recent Labs Lab 04/30/13 1700 05/01/13 0410 05/01/13 1600 04/30/2013 0413  NA 145 142 139 139  K 6.2* 5.6* 5.1 5.4*  CL 98 98 98 101  CO2 35* 29 27 27   GLUCOSE 141* 164* 215* 163*  BUN 76* 69* 57* 46*  CREATININE 3.33* 3.24* 2.66* 2.20*  CALCIUM 6.4* 6.2* 6.8* 7.2*  PHOS 10.5*  --  7.1* 6.3*    Recent Labs Lab 04/30/13 0430 05/01/13 0410 05/23/2013 0410  WBC 53.0* 38.4* 31.3*  HGB 11.3* 11.1* 11.1*  HCT 36.8* 35.9* 37.1*  MCV 99.7 100.8* 103.1*  PLT 302 217 234    ABG    Component Value Date/Time   PHART 7.148* 05/25/2013 0557   PCO2ART 77.1* 05/10/2013 0557   PO2ART 68.9* 05/24/2013 0557   HCO3 27.1* 05/12/2013 0557   TCO2 29.8 05/23/2013 0557   ACIDBASEDEF 1.6 05/22/2013 0557   O2SAT 94.7 05/22/2013 0557    Plan:  1. Dialysis dependent oliguric AKI 2/2 ATN in setting of VDRF, septic shock.  Cont CRRT, if family decides to cont full support can inc replacement rate and DFR.   2. Hyperkalemia: as above.  S/p 75gm of kayexalate 04/30/13.  Would not give any further.  Concernign that pt not yet with BM 3. Metabolic Alkalosis: resolving on HD 4. VDRF: per CCM 5. Septic Shock: Vanc/Zosyn, per [place], MD [facility] pgr [phone]

## 2013-05-27 NOTE — Progress Notes (Signed)
Respiratory therapy note- Patient extubated per MD order and faimily's wishes.

## 2013-05-27 NOTE — Progress Notes (Signed)
PULMONARY / CRITICAL CARE MEDICINE  Name: Connor Ford MRN: [id number] DOB: 1945/08/03    ADMISSION DATE:  04/08/2013 CONSULTATION DATE:  04/13/2013  REFERRING MD :  [facility] PRIMARY SERVICE:  PCCM  CHIEF COMPLAINT:  Acute respiratory failure  BRIEF PATIENT DESCRIPTION: 68 y/o w/ PMHx of systolic CHF and sleep apnea admitted on 1/15 with dyspnea, fever, and pulmonary infiltrates. On 1/16, patient developed acute respiratory distress and CXR demonstrated worsening bilateral airspace disease.    SIGNIFICANT EVENTS / STUDIES:  1/15 Admitted for pneumonia 1/16 Transferred to ICU/Intubated 1/17 Abd [place]: mild distention of GB. O/W normal 1/19 Extubated 1/23 Transfer to SDU 1/26 Unable to maintain sats on 100%. NPPV resumed 1/26 1/31 persistent hypoxemia. Intermittent NPPV 2/01 transfer to ICU for refractory hypoxemia. Re-intubated 2/01 Refractory hypotension, vasopressors started 2/02 oliguric renal failure. Renal consult. Suspected severe sepsis/shock. Cultures obtained. Empiric abx started.  2/02 Renal US: Normal 2/03 -2/04 Remained anuric. Worsening hypotension. New development of hypothermia 2/04 Conference with wife, 3 siblings and 2 sons. I explained that there was a decreasing chance of survival . More importantly, if he were to survive, it would be with a marked degree of debilitation - especially due to permanent respiratory disease. 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 The entire family was in strong agreement that he would not wish to survive with significant decrement in QOL from his prior level. They all believe that he would direct [place] to stop our aggressive measures and provide only comfort measures.. This was deemed medically acceptable. Full comfort measures initiated and vent/vasopressors/CRRT discontinued  LINES / TUBES: ETT 1/16 >> 1/19 CVL 1/16 >> 1/22 R. Radial A-line 1/16 >> 1/20 ETT 2/01 >>  R IJ CVL 2/01 >>   R radial A-line 2/01 >>  L IJ HD cath 2/02 >>   CULTURES: 1/15 Blood >>>neg 1/15 Urine >>>no growth 1/15 Resp virus panel nasal swab >>> neg 1/17 Resp >>>few candida, negative 1/18 Resp virus panel trach aspir >>> Flu A positive 1/19 Blood >>>neg Urine 2/02 >> UA negative Resp 2/02 >> cancelled Blood 2/02 >>    ANTIBIOTICS: PCT 2/02 - 8.47, 2/03 - 6.55, 2/04 -   Ceftriaxone 1/15 >>> 1/16 Azithromycin 1/15 >>> 1/16  Levaquin 1/16 >>> 1/16 Tamiflu 1/16 >>> 1/19 Vancomycin 1/16 >>> 1/21 Zosyn 1/16 >>> 1/20 Rocephin 1/20 >>>1/23 Levaquin 1/23 >>>1/26 Vanc 2/01 >> 2/04 Pip-tazo 2/01 >> 2/04   SUBJ: RASS -4, not F/C. No WUA performed   VITAL SIGNS: Temp:  [92.5 F (33.6 C)-99.1 F (37.3 C)] 97.3 F (36.3 C) (02/04 1100) Pulse Rate:  [80-111] 111 (02/04 1100) Resp:  [20-27] 24 (02/04 1100) BP: (103-122)/(34-45) 103/42 mmHg (02/04 1100) SpO2:  [87 %-100 %] 87 % (02/04 1100) Arterial Line BP: (90-180)/(32-99) 104/34 mmHg (02/04 1100) FiO2 (%):  [50 %] 50 % (02/04 1216) Weight:  [91.2 kg (201 lb 1 oz)] 91.2 kg (201 lb 1 oz) (02/04 0406)   INTAKE / OUTPUT: Intake/Output     02/03 0701 - 02/04 0700 02/04 0701 - 02/05 0700   I.V. (mL/kg) 1199.8 (13.2) 183.3 (2)   NG/GT 640 20   IV Piggyback 300 50   Total Intake(mL/kg) 2139.8 (23.5) 253.3 (2.8)   Urine (mL/kg/hr) 10 (0)    Other 3633 (1.7) 323 (0.6)   Total Output 3643 323   Net -1503.2 -69.7         PHYSICAL EXAMINATION: General:  RASS -4 Neuro:  Sedated, MAEs HEENT:  WNL Cardiovascular: RRR, soft systolic M Lungs: bilat dependent crackles Abdomen:  Soft, NT, +BS Musculoskeletal: No edema. Chronic venous stasis changes in LE's.  LABS: I have reviewed all of today's lab results. Relevant abnormalities are discussed in the A/P section  CXR: [place] ARDS pattern   ASSESSMENT: Acute hypoxemic respiratory failure Recent influenza A ARDS, relapsing CAP, resolved Suspected HCAP Severe leukocytosis Severe  sepsis Septic shock H/O CHF with preserved LVEF Moderate mitral stenosis  Pulm HTN  CAF, rate controlled Acute renal failure - likely sepsis related ATN Elevated transaminases, likely hepatic congestion Gastric ileus Hyperglycemia due to steroids/stress Anemia of critical illness   Conference with wife, 3 siblings and 2 sons. I explained that there was a decreasing chance of survival . More importantly, if he were to survive, it would be with a marked degree of debilitation - especially due to permanent respiratory disease. 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 The entire family was in strong agreement that he would not wish to survive with significant decrement in QOL from his prior level. They all believe that he would direct [place] to stop our aggressive measures and provide only comfort measures.. This was deemed medically acceptable. Full comfort measures initiated and vent/vasopressors/CRRT discontinued  PLAN: Full comfort care DC vent, CRRT, vasopressors  CCM X 40 mins including extended family conference discussing goals of care and plan   Merton Border, MD ; [facility] service Mobile [phone].  After 5:30 PM or weekends, call [phone]

## 2013-05-27 DEATH — deceased

## 2013-06-21 ENCOUNTER — Telehealth: Payer: Self-pay

## 2013-06-21 NOTE — Telephone Encounter (Signed)
Spoke with Bethena Roys and advised that Dr Alva Garnet is a hospital based Dr and pt was also seen by Dr Leanne Chang in the hospital.  Recommended that she call Dr Leanne Chang office to obtain letter.  She verbalized understanding.

## 2014-01-24 IMAGING — DX DG CHEST 1V PORT
1 series · 1 of 1 positions shown · non-contrast
Comparison: 04/18/2013

CLINICAL DATA: Short of breath.  Respiratory distress.

EXAM:
PORTABLE CHEST - 1 VIEW

[portable]
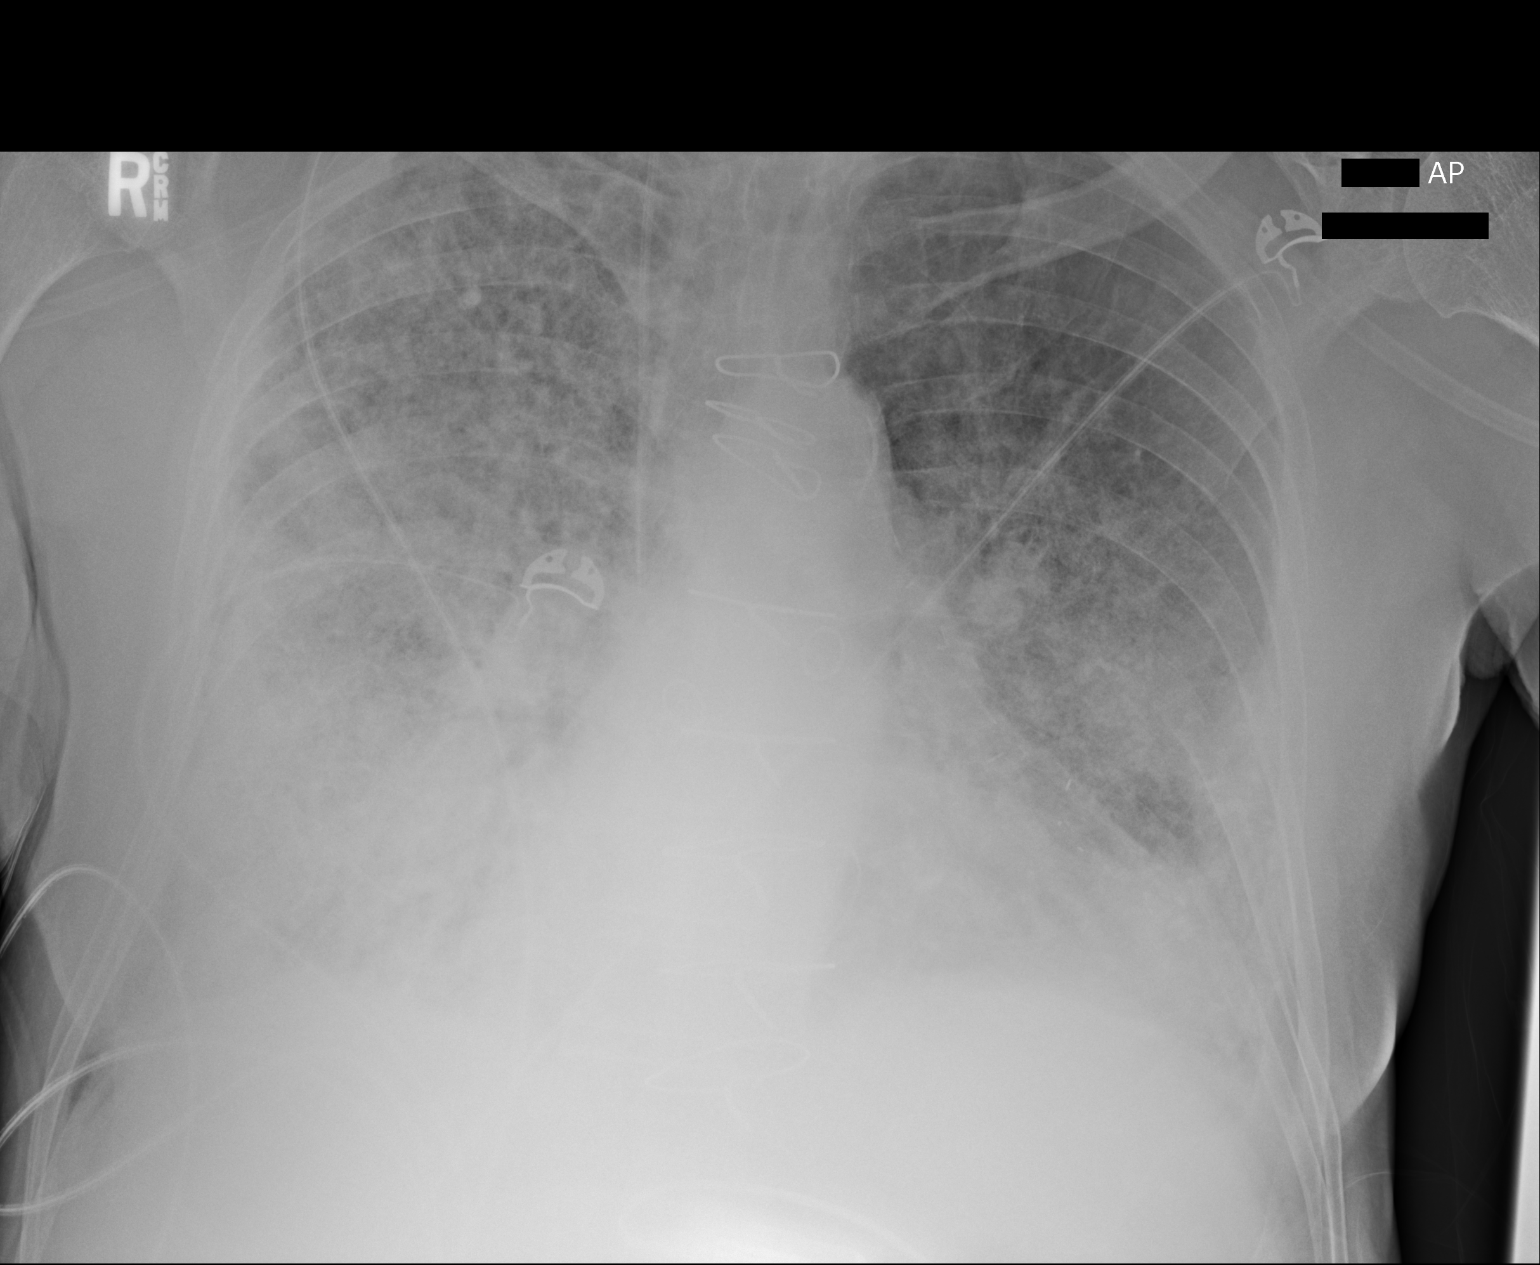

[1 of 1 positions shown; findings below may reference images not displayed]

FINDINGS: Severe diffuse bilateral airspace disease is present. There is
suggestion of mild progression.

Right jugular catheter tip in the SVC.  Small left effusion.
IMPRESSION: Severe diffuse bilateral airspace disease may represent edema or
pneumonia or ARDS. Mild progression.

## 2014-02-05 IMAGING — CR DG CHEST 1V PORT
1 series · 1 of 1 positions shown · non-contrast
Comparison: 04/30/2013

CLINICAL DATA: Check endotracheal tube position

EXAM:
PORTABLE CHEST - 1 VIEW

[AP]
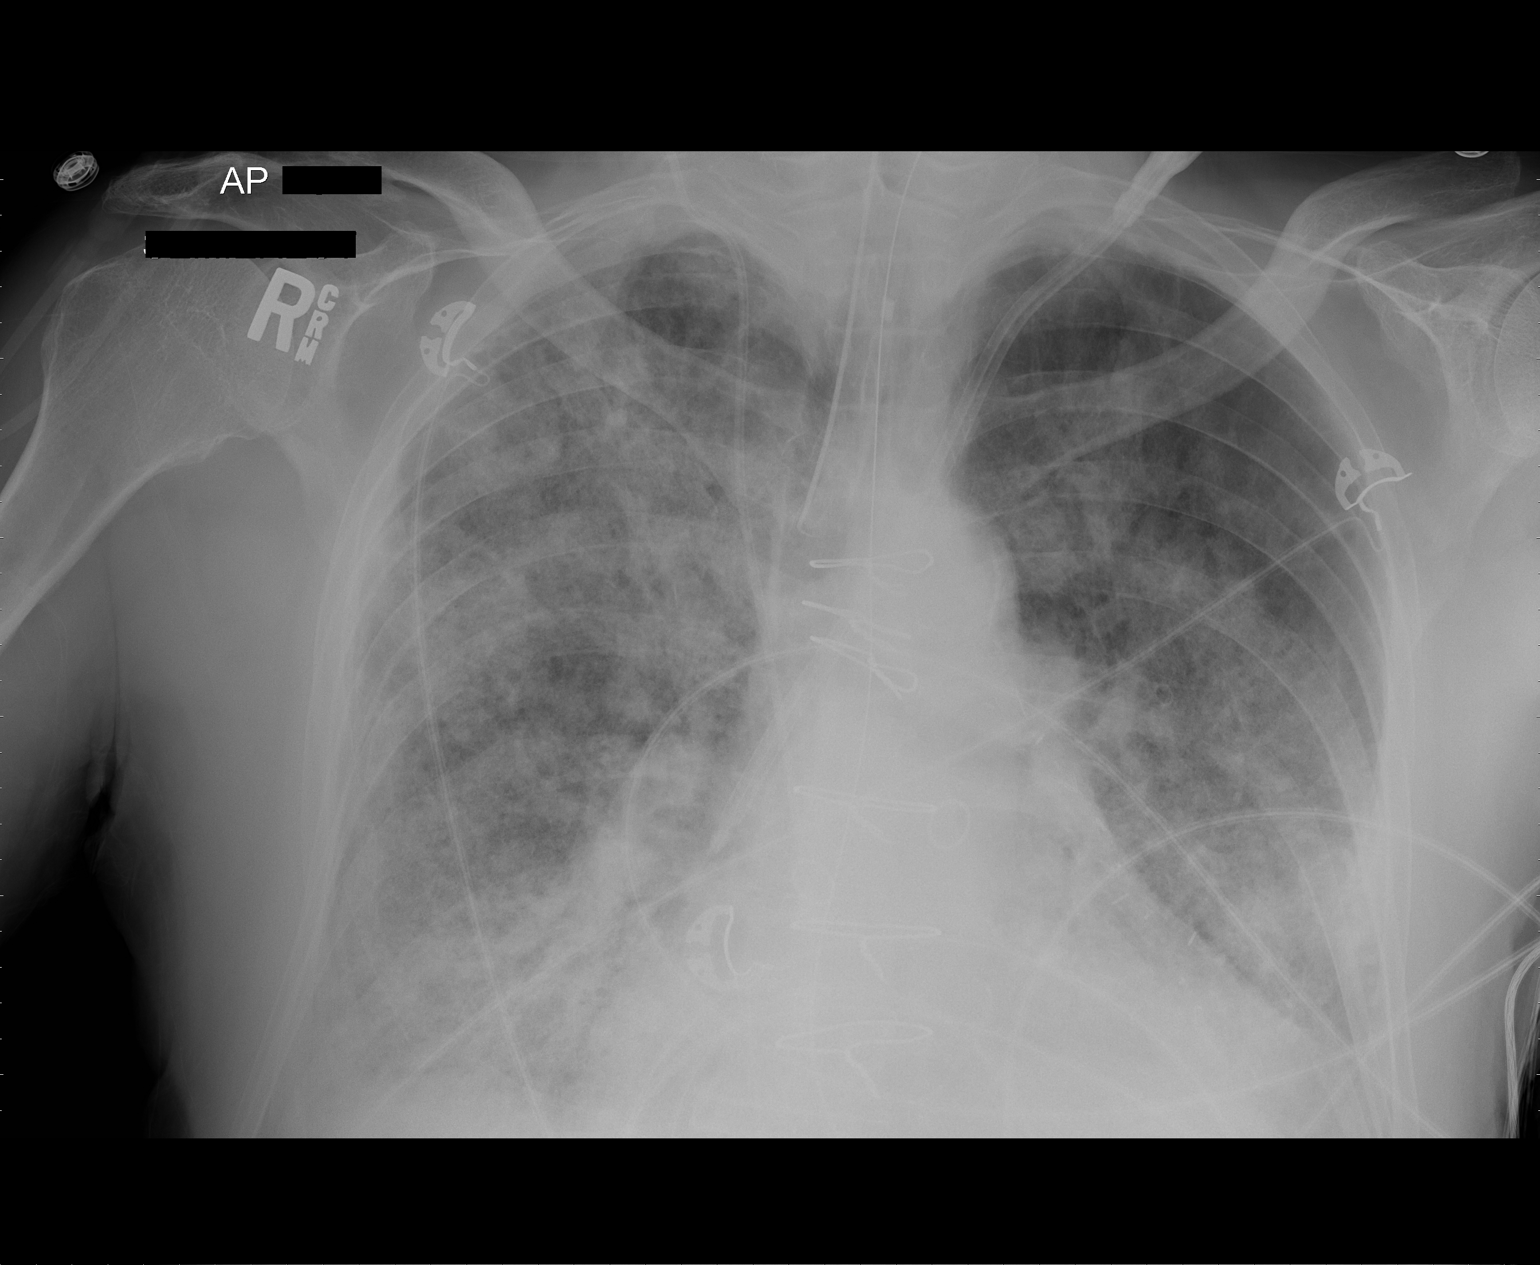

[1 of 1 positions shown; findings below may reference images not displayed]

FINDINGS: Endotracheal tube tip is 2.9 cm above the carina. Right internal
jugular central line tip is again seen near the cavoatrial junction.
NG tube crosses the gastroesophageal junction. Dual lumen left
subclavian central line in unchanged position. Severe bilateral
pulmonary parenchymal infiltrates are again identified without
significant change allowing for differences in technique.
IMPRESSION: Endotracheal tube as described with severe persistent bilateral
pulmonary parenchymal infiltrates.
